# Patient Record
Sex: Male | Born: 1940
Health system: Southern US, Community
[De-identification: ages and names within clinical notes are randomized; demographics above are authoritative.]

## PROBLEM LIST (undated history)

## (undated) DIAGNOSIS — L4059 Other psoriatic arthropathy: Secondary | ICD-10-CM

## (undated) DIAGNOSIS — C61 Malignant neoplasm of prostate: Secondary | ICD-10-CM

---

## 1998-07-22 ENCOUNTER — Ambulatory Visit (HOSPITAL_COMMUNITY): Admission: RE | Admit: 1998-07-22 | Discharge: 1998-07-22 | Payer: Self-pay | Admitting: Urology

## 2007-06-06 HISTORY — PX: OTHER SURGICAL HISTORY: SHX169

## 2007-12-13 ENCOUNTER — Ambulatory Visit: Admission: RE | Admit: 2007-12-13 | Discharge: 2008-03-12 | Payer: Self-pay | Admitting: Radiation Oncology

## 2008-02-03 ENCOUNTER — Encounter: Admission: RE | Admit: 2008-02-03 | Discharge: 2008-02-03 | Payer: Self-pay | Admitting: Urology

## 2008-03-02 ENCOUNTER — Ambulatory Visit (HOSPITAL_BASED_OUTPATIENT_CLINIC_OR_DEPARTMENT_OTHER): Admission: RE | Admit: 2008-03-02 | Discharge: 2008-03-02 | Payer: Self-pay | Admitting: Urology

## 2008-03-12 ENCOUNTER — Ambulatory Visit: Admission: RE | Admit: 2008-03-12 | Discharge: 2008-05-18 | Payer: Self-pay | Admitting: Radiation Oncology

## 2010-10-18 NOTE — Op Note (Signed)
Lucas Holloway, Lucas Holloway                  ACCOUNT NO.:  [id number]   MEDICAL RECORD NO.:  [id number]          PATIENT TYPE:  AMB   LOCATION:  [facility]                         FACILITY:  [facility]   PHYSICIAN:  Excell Seltzer. Annabell Howells, M.D.    DATE OF BIRTH:  Feb 16, 1941   DATE OF PROCEDURE:  03/02/2008  DATE OF DISCHARGE:                               OPERATIVE REPORT   PROCEDURE:  1. Right hydrocelectomy.  2. Prostate seed implantation.   PREOPERATIVE DIAGNOSIS:  Right hydrocele and T2 A Gleason 6  adenocarcinoma the prostate.   POSTOPERATIVE DIAGNOSIS:  Right hydrocele and T2 A Gleason 6  adenocarcinoma the prostate.   SURGEON:  Dr. Bjorn Pippin.   RADIATION ONCOLOGIST:  Dr. Margaretmary Bayley.   ANESTHESIA:  General.   DRAIN:  Foley catheter and Penrose wound drain.   COMPLICATIONS:  None.   INDICATIONS:  Mr. Pineda is a 70 year old white male with a symptomatic  right hydrocele and a T2A NX MX adenocarcinoma of the prostate who is to  undergo right hydrocelectomy and radioactive seed implantation.   FINDINGS AND PROCEDURE:  The patient is given Cipro.  He was taken to  the operating room where general anesthetic was induced.  He was fitted  with PAS hose and was initially placed in the supine position.  His  scrotum and perineum were clipped.  He was prepped with Betadine  solution and draped in the usual sterile fashion.  An oblique right  anterior scrotal incision was made and the testicle was delivered from  the scrotum within the hydrocele sac.  The sac was opened and was  imbricated behind the testicle using a running 3-0 chromic suture.  Once  the hydrocele had been imbricated, hemostasis was achieved and the  testicle was returned to the right hemi scrotum.  A 1/4 inch Penrose  drain was placed through separate stab wound in the dependent portion of  the scrotum.  The wound was closed using a running 3-0 chromic on the  dartos and a running vertical mattress 3-0 chromic on the skin.  Great  care was taken to avoid entrapping the drain.  A dressing of 4x4s was  applied and a 16-French Foley catheter was inserted.  The balloon was  filled with dilute contrast.   The patient was then repositioned in the lithotomy position.  The  scrotum was then reflected from the perineum superiorly with an OpSite.  A red rubber rectal catheter was placed.  The ultrasound probe was  assembled and inserted and Dr. Kathrynn Running performed the prostate contouring  in preparation for the implant.   Once the dosimetry been completed, I returned and placed the needles per  the intraoperative plan, a total of 22 needles with 72 seeds of I-125  select seed U-1 was used to deliver a reference dose of 145 gray.   After completion of the implant the scrotal drape was removed.  The  Foley catheter was removed and cystoscopy was performed.  This  examination demonstrated a normal urethra and intact external sphincter.  The prostatic urethra had trilobar hyperplasia with mild  obstruction.  There was some bleeding in the area of the prostatic urethra with a  small amount of adherent clot but not much active bleeding.  There was a  small stringy clot in the bladder but no residual seeds.  No bladder  tumors were noted.  Ureteral orifices were unremarkable.  There was mild  trabeculation.   A fresh 16-French Foley catheter was inserted.  The balloon was filled  10 mL sterile fluid.  The bladder was irrigated with a Toomey syringe  with return of the previously-mentioned clots.  The catheter was then  placed to straight drainage.  He was taken down from lithotomy position  after a dressing was applied to the perineum and his anesthetic was  reversed.  He was removed to the recovery room in stable condition.  There were no complications.      Excell Seltzer. Annabell Howells, M.D.  Electronically Signed     JJW/MEDQ  D:  03/02/2008  T:  03/02/2008  Job:  [id number]   cc:   Tammy R. Collins [place], M.D.  Fax: [phone]   Artist Pais.  Kathrynn Running, M.D.  Fax: [phone]

## 2012-02-14 ENCOUNTER — Encounter (HOSPITAL_COMMUNITY): Payer: Self-pay | Admitting: *Deleted

## 2012-02-14 ENCOUNTER — Emergency Department (HOSPITAL_COMMUNITY)
Admission: EM | Admit: 2012-02-14 | Discharge: 2012-02-14 | Disposition: A | Discharge status: Home / Self Care | Payer: Medicare Other | Attending: Emergency Medicine | Admitting: Emergency Medicine

## 2012-02-14 DIAGNOSIS — Z8546 Personal history of malignant neoplasm of prostate: Secondary | ICD-10-CM | POA: Insufficient documentation

## 2012-02-14 DIAGNOSIS — R339 Retention of urine, unspecified: Secondary | ICD-10-CM

## 2012-02-14 HISTORY — DX: Malignant neoplasm of prostate: C61

## 2012-02-14 LAB — URINALYSIS, MICROSCOPIC ONLY
Bilirubin Urine: NEGATIVE
Glucose, UA: NEGATIVE mg/dL
Ketones, ur: NEGATIVE mg/dL
pH: 6.5 (ref 5.0–8.0)

## 2012-02-14 NOTE — ED Provider Notes (Signed)
History     CSN: [id number]  Arrival date & time 02/14/12  1844   First MD Initiated Contact with Patient 02/14/12 2126      Chief Complaint  Patient presents with  . Urinary Retention   HPI  History provided by the patient. Patient is a 71 year old male with history of prostate cancer and seeding performed in 2009 who presents with complaints of urinary retention and lower pelvic and abdominal pain and pressure. Patient denies having similar symptoms previously. Patient states that he was feeling well today around 1 PM after eating lunch began to have reduced urinary output with dribbling. Patient felt the urge to use bathroom again at 2 PM and again had small amounts of urine. Patient states 3 PM he is not getting any urine out but feeling some urge to urinate. Symptoms gradually increased throughout the evening until he began to have severe lower abdominal pains and pressures tonight. Patient denies having similar symptoms previously. Prior to symptoms today he denied any dysuria, hematuria, urinary frequency or flank pains. Patient denies any fever, chills or nausea vomiting.    Past Medical History  Diagnosis Date  . Prostate cancer   . Arthritis     History reviewed. No pertinent past surgical history.  No family history on file.  History  Substance Use Topics  . Smoking status: Never Smoker   . Smokeless tobacco: Not on file  . Alcohol Use: No      Review of Systems  Constitutional: Negative for fever and chills.  Gastrointestinal: Positive for abdominal pain. Negative for nausea, vomiting, diarrhea and constipation.  Genitourinary: Positive for hematuria, decreased urine volume and difficulty urinating. Negative for dysuria, frequency, flank pain and discharge.    Allergies  Review of patient's allergies indicates no known allergies.  Home Medications   Current Outpatient Rx  Name Route Sig Dispense Refill  . CALCIUM CARBONATE-VITAMIN D 500-200 MG-UNIT PO  TABS Oral Take 1 tablet by mouth 2 (two) times daily.    . OMEGA-3 FATTY ACIDS 1000 MG PO CAPS Oral Take 1 g by mouth 2 (two) times daily.    Marland Kitchen FOLIC ACID 1 MG PO TABS Oral Take 1 mg by mouth daily.    . MELOXICAM 7.5 MG PO TABS Oral Take 7.5 mg by mouth daily.    Marland Kitchen METHOTREXATE 2.5 MG PO TABS Oral Take 15 mg by mouth once a week. Takes on sundays.Marland KitchenMarland KitchenCaution:Chemotherapy. Protect from light.    . ADULT MULTIVITAMIN W/MINERALS CH Oral Take 1 tablet by mouth daily.    Marland Kitchen NIACIN 500 MG PO TABS Oral Take 500 mg by mouth daily with breakfast.    . SIMVASTATIN 40 MG PO TABS Oral Take 40 mg by mouth every evening.    Marland Kitchen VITAMIN E 400 UNITS PO CAPS Oral Take 400 Units by mouth daily.      BP 181/77  Pulse 82  Temp 97.9 F (36.6 C) (Oral)  Resp 20  Ht 5\' 8"  (1.727 m)  Wt 180 lb (81.647 kg)  BMI 27.37 kg/m2  SpO2 99%  Physical Exam  Nursing note and vitals reviewed. Constitutional: He appears well-developed and well-nourished. No distress.  HENT:  Head: Normocephalic.  Cardiovascular: Normal rate and regular rhythm.   Pulmonary/Chest: Effort normal and breath sounds normal. No respiratory distress. He has no wheezes. He has no rales.  Abdominal: Soft. He exhibits no distension. There is no tenderness. There is no rebound and no guarding.  Genitourinary: Penis normal.  Foley catheter in place. Small amount of blood around the urethra and meatus. No active bleeding. There is some blood within the Foley catheter system.  Neurological: He is alert.  Skin: Skin is warm.  Psychiatric: He has a normal mood and affect. His behavior is normal.    ED Course  Procedures   Results for orders placed during the hospital encounter of 02/14/12  URINALYSIS, WITH MICROSCOPIC      Component Value Range   Color, Urine YELLOW  YELLOW   APPearance CLEAR  CLEAR   Specific Gravity, Urine 1.016  1.005 - 1.030   pH 6.5  5.0 - 8.0   Glucose, UA NEGATIVE  NEGATIVE mg/dL   Hgb urine dipstick LARGE (*)  NEGATIVE   Bilirubin Urine NEGATIVE  NEGATIVE   Ketones, ur NEGATIVE  NEGATIVE mg/dL   Protein, ur NEGATIVE  NEGATIVE mg/dL   Urobilinogen, UA 0.2  0.0 - 1.0 mg/dL   Nitrite NEGATIVE  NEGATIVE   Leukocytes, UA NEGATIVE  NEGATIVE   RBC / HPF 21-50  <3 RBC/hpf   Bacteria, UA RARE  RARE       1. Urinary retention       MDM  9:25pm patient seen and evaluated. Patient reports feeling significantly improved after having a Foley catheter inserted. Patient denies history of similar symptoms in the past. Did have prostate seeding for cancer performed at 2009 by Dr. Annabell Howells.        Angus Seller, [place] 02/15/12 [phone]

## 2012-02-14 NOTE — ED Notes (Addendum)
Pt reports urinary retention that began approx 1300 today, pt has only had dribbles or urine since 1300. Pt w/ hx prostate CA, treated in 2008. Pt in acute distress d/t lower abd pain/pressure. Pt was seen at [facility] for same and was told he needed a catheter however they did not have the supplies at the facility and pt was then referred to ED.

## 2012-02-14 NOTE — ED Notes (Signed)
Pt reports also received flu vaccine on Monday, developed cough and yesterday and trouble urinating today.

## 2012-02-16 LAB — URINE CULTURE
Colony Count: NO GROWTH
Culture: NO GROWTH

## 2012-02-16 NOTE — ED Provider Notes (Signed)
Medical screening examination/treatment/procedure(s) were performed by non-physician practitioner and as supervising physician I was immediately available for consultation/collaboration.  Derwood Kaplan, MD 02/16/12 0045

## 2015-02-18 DIAGNOSIS — Z79899 Other long term (current) drug therapy: Secondary | ICD-10-CM | POA: Diagnosis not present

## 2015-02-18 DIAGNOSIS — L4050 Arthropathic psoriasis, unspecified: Secondary | ICD-10-CM | POA: Diagnosis not present

## 2015-02-18 DIAGNOSIS — L401 Generalized pustular psoriasis: Secondary | ICD-10-CM | POA: Diagnosis not present

## 2015-03-29 ENCOUNTER — Other Ambulatory Visit: Payer: Self-pay | Admitting: Urology

## 2015-03-29 DIAGNOSIS — C61 Malignant neoplasm of prostate: Secondary | ICD-10-CM

## 2015-04-13 ENCOUNTER — Encounter (HOSPITAL_COMMUNITY)
Admission: RE | Admit: 2015-04-13 | Discharge: 2015-04-13 | Disposition: A | Discharge status: Home / Self Care | Payer: Medicare Other | Source: Ambulatory Visit | Attending: Urology | Admitting: Urology

## 2015-04-13 DIAGNOSIS — C61 Malignant neoplasm of prostate: Secondary | ICD-10-CM | POA: Insufficient documentation

## 2015-04-13 MED ORDER — TECHNETIUM TC 99M MEDRONATE IV KIT: 25.0000 | PACK | Freq: Once | INTRAVENOUS | Status: DC | PRN

## 2015-04-13 MED ORDER — TECHNETIUM TC 99M MEDRONATE IV KIT
26.9000 | PACK | Freq: Once | INTRAVENOUS | Status: AC | PRN
  Administered 2015-04-13: 26.9 via INTRAVENOUS

## 2015-08-16 ENCOUNTER — Other Ambulatory Visit (INDEPENDENT_AMBULATORY_CARE_PROVIDER_SITE_OTHER): Payer: Self-pay | Admitting: Otolaryngology

## 2015-08-16 DIAGNOSIS — H903 Sensorineural hearing loss, bilateral: Secondary | ICD-10-CM | POA: Diagnosis not present

## 2015-08-16 DIAGNOSIS — H9190 Unspecified hearing loss, unspecified ear: Secondary | ICD-10-CM

## 2015-08-16 DIAGNOSIS — H6983 Other specified disorders of Eustachian tube, bilateral: Secondary | ICD-10-CM | POA: Diagnosis not present

## 2015-08-30 ENCOUNTER — Ambulatory Visit
Admission: RE | Admit: 2015-08-30 | Discharge: 2015-08-30 | Disposition: A | Discharge status: Home / Self Care | Payer: Medicare Other | Source: Ambulatory Visit | Attending: Otolaryngology | Admitting: Otolaryngology

## 2015-08-30 DIAGNOSIS — H9191 Unspecified hearing loss, right ear: Secondary | ICD-10-CM | POA: Diagnosis not present

## 2015-08-30 DIAGNOSIS — H9190 Unspecified hearing loss, unspecified ear: Secondary | ICD-10-CM

## 2015-08-30 MED ORDER — GADOBENATE DIMEGLUMINE 529 MG/ML IV SOLN
17.0000 mL | Freq: Once | INTRAVENOUS | Status: AC | PRN
  Administered 2015-08-30: 17 mL via INTRAVENOUS

## 2015-09-01 DIAGNOSIS — C61 Malignant neoplasm of prostate: Secondary | ICD-10-CM | POA: Diagnosis not present

## 2015-09-01 DIAGNOSIS — C7951 Secondary malignant neoplasm of bone: Secondary | ICD-10-CM | POA: Diagnosis not present

## 2015-09-08 DIAGNOSIS — D352 Benign neoplasm of pituitary gland: Secondary | ICD-10-CM | POA: Diagnosis not present

## 2015-09-08 DIAGNOSIS — Z6825 Body mass index (BMI) 25.0-25.9, adult: Secondary | ICD-10-CM | POA: Diagnosis not present

## 2015-09-10 DIAGNOSIS — D352 Benign neoplasm of pituitary gland: Secondary | ICD-10-CM | POA: Diagnosis not present

## 2015-09-10 DIAGNOSIS — Z0100 Encounter for examination of eyes and vision without abnormal findings: Secondary | ICD-10-CM | POA: Diagnosis not present

## 2015-09-27 DIAGNOSIS — E039 Hypothyroidism, unspecified: Secondary | ICD-10-CM | POA: Diagnosis not present

## 2015-09-27 DIAGNOSIS — C7951 Secondary malignant neoplasm of bone: Secondary | ICD-10-CM | POA: Diagnosis not present

## 2015-09-27 DIAGNOSIS — C61 Malignant neoplasm of prostate: Secondary | ICD-10-CM | POA: Diagnosis not present

## 2015-09-29 DIAGNOSIS — E039 Hypothyroidism, unspecified: Secondary | ICD-10-CM | POA: Diagnosis not present

## 2015-09-29 DIAGNOSIS — E782 Mixed hyperlipidemia: Secondary | ICD-10-CM | POA: Diagnosis not present

## 2015-09-29 DIAGNOSIS — J309 Allergic rhinitis, unspecified: Secondary | ICD-10-CM | POA: Diagnosis not present

## 2015-09-29 DIAGNOSIS — K219 Gastro-esophageal reflux disease without esophagitis: Secondary | ICD-10-CM | POA: Diagnosis not present

## 2015-10-04 DIAGNOSIS — R351 Nocturia: Secondary | ICD-10-CM | POA: Diagnosis not present

## 2015-10-04 DIAGNOSIS — C61 Malignant neoplasm of prostate: Secondary | ICD-10-CM | POA: Diagnosis not present

## 2015-10-04 DIAGNOSIS — C7951 Secondary malignant neoplasm of bone: Secondary | ICD-10-CM | POA: Diagnosis not present

## 2015-10-04 DIAGNOSIS — Z0000 Encounter for general adult medical examination without abnormal findings: Secondary | ICD-10-CM | POA: Diagnosis not present

## 2015-10-25 DIAGNOSIS — L4050 Arthropathic psoriasis, unspecified: Secondary | ICD-10-CM | POA: Diagnosis not present

## 2015-10-28 ENCOUNTER — Ambulatory Visit (INDEPENDENT_AMBULATORY_CARE_PROVIDER_SITE_OTHER): Payer: Medicare Other | Admitting: Internal Medicine

## 2015-10-28 ENCOUNTER — Encounter: Payer: Self-pay | Admitting: Internal Medicine

## 2015-10-28 VITALS — BP 112/62 | HR 79 | Temp 97.5°F | Resp 12 | Wt 164.0 lb

## 2015-10-28 DIAGNOSIS — D352 Benign neoplasm of pituitary gland: Secondary | ICD-10-CM

## 2015-10-28 NOTE — Patient Instructions (Addendum)
Please come back at 8 am, fasting, for labs. Do not take Levothyroxine (thyroid hormone) before labs, but take it immediately after.  Please continue Levothyroxine 75 mcg daily.  Take the thyroid hormone every day, with water, at least 30 minutes before breakfast, separated by at least 4 hours from: - acid reflux medications - calcium - iron - multivitamins  Please come back in 1 year.

## 2015-10-28 NOTE — Progress Notes (Signed)
Patient ID: Lucas Holloway, male   DOB: 1940-06-26, 75 y.o.   MRN: [id number]   HPI  Lucas Holloway is a 75 y.o.-year-old male, referred by his neurosurgeon, Dr. Cyndy Freeze for evaluation for pituitary macroadenoma. He is here with his wife who offers additional history.  Pt. has been dx with a pituitary adenoma in 75/27/2017. This was found during investigation for worsening hearing loss, dx 2015.  Report: Pituitary mass likely reflecting a pituitary macroadenoma measuring 15 x 15 x 16 mm:     Of note, he also has metastatic prostate cancer dx in 2009. He had Rx seeds implanted. He is on Firmagon (degarelix) - Tx'ed by Dr. [name].  Pt denies: - headaches - weight gain - fatigue - constipation - dry skin - hair loss - depression - no increased thirst, but frequent urination ever after his prostate surgery  No pituitary test available for review (except testosterone levels per review of urology note):   Latest Na level normal: 09/27/2015: 135 (135-146)  He had a Visual Field test checked - 09/2015 >> reportedly normal.  He has primary hypothyroidism dx 2016 >> on LT4 75 mcg daily: - at 3-4 am - with water - eats >30 min later- takes calcium and MVI at b'fast and later   ROS: Constitutional: no weight gain/loss, no fatigue, no subjective hyperthermia/hypothermia, + nocturia Eyes: no blurry vision, no xerophthalmia ENT: no sore throat, no nodules palpated in throat, no dysphagia/odynophagia, no hoarseness Cardiovascular: no CP/SOB/palpitations/leg swelling Respiratory: no cough/SOB Gastrointestinal: no N/V/D/C, + acid reflux Musculoskeletal: no muscle/joint aches Skin: no rashes Neurological: no tremors/numbness/tingling/dizziness Psychiatric: no depression/anxiety  Past Medical History  Diagnosis Date  . Prostate cancer ([place])   . Arthritis    No past surgical history on file. Social History   Social History  . Marital Status: Married    Spouse Name: N/A  . Number of  Children: yes   Occupational History  . retired   Social History Main Topics  . Smoking status: Former Research scientist (life sciences)  . Smokeless tobacco: Not on file  . Alcohol Use: No  . Drug Use: No   Current Outpatient Prescriptions on File Prior to Visit  Medication Sig Dispense Refill  . calcium-vitamin D (OSCAL WITH D) 500-200 MG-UNIT per tablet Take 1 tablet by mouth 2 (two) times daily.    . fish oil-omega-3 fatty acids 1000 MG capsule Take 1 g by mouth 2 (two) times daily.    . folic acid (FOLVITE) 1 MG tablet Take 1 mg by mouth daily.    . meloxicam (MOBIC) 7.5 MG tablet Take 7.5 mg by mouth daily.    . methotrexate (RHEUMATREX) 2.5 MG tablet Take 15 mg by mouth once a week. Takes on sundays.Marland KitchenMarland KitchenCaution:Chemotherapy. Protect from light.    . Multiple Vitamin (MULTIVITAMIN WITH MINERALS) TABS Take 1 tablet by mouth daily.    . simvastatin (ZOCOR) 40 MG tablet Take 40 mg by mouth every evening.    . vitamin E 400 UNIT capsule Take 400 Units by mouth daily.     No current facility-administered medications on file prior to visit.   No Known Allergies   No family history on file.  PE: BP 112/62 mmHg  Pulse 79  Temp(Src) 97.5 F (36.4 C) (Oral)  Resp 12  Wt 164 lb (74.39 kg)  SpO2 95% Body mass index is 24.94 kg/(m^2). Wt Readings from Last 3 Encounters:  10/28/15 164 lb (74.39 kg)  02/14/12 180 lb (81.647 kg)   Constitutional: Normal weight, in NAD  Eyes: PERRLA, EOMI, no exophthalmos ENT: moist mucous membranes, no thyromegaly, no cervical lymphadenopathy Cardiovascular: RRR, No MRG Respiratory: CTA B Gastrointestinal: abdomen soft, NT, ND, BS+ Musculoskeletal: no deformities, strength intact in all 4 Skin: moist, warm, no rashes Neurological: no tremor with outstretched hands, DTR normal in all 4, difficult to evaluate peripheral visual field due to wearing glasses (he had left and right mid and upper impairment, however, it is not clear whether these are from a smaller  glasses)  ASSESSMENT: 1. Pituitary macroadenoma  2. Primary hypothyroidism  PLAN:  1. Patient with a newly diagnosed pituitary macroadenoma, incidentally found during investigation for worsening hypoacusis.  - I reviewed the pituitary MRI images and report along with the patient and his wife. Since the tumor is >1 cm, this qualifies as a macro-adenoma.  - I explained that this is not a "brain tumor". These tumors are extremely rarely malignant, the vast majority of them are benign. I discussed with him that there is a small possibility of pituitary metastasis from his metastatic prostate cancer. I would think about a possible biopsy only if the mass enlarges or if we see hypopituitarism. - We discussed about the fact that the pituitary adenoma can be:  Not producing hormones, not compressing the pituitary gland or the optic chiasm  Not producing hormones, but compressing either the pituitary gland (causing hypopituitarism) or the optic chiasm (causing visual field cuts)  Not producing hormones, not compressing the pituitary gland or the optic chiasm, but growing in size >> an indication that he may develop compression later on  Producing hormones: - Prolactin (prolactinoma) - he has no breast milk discharge, no HAs.  - ACTH (Cushing's disease) - he has no weight gain, no central distribution of fat, wide, purple, stretch marks, full supraclavicular fat pads, diabetes, hypertension. - growth hormone (acromegaly) - he denies an enlarged jaw, change in the size of rings or changing shoe sizes - LH or FSH (gonadotropin secreting tumor) (rare) - TSH (TSH secreting tumor) (very rare) - He is having increased urination at night, but this started after his prostate surgery and is not associated with increased thirst. His sodium levels have been normal, so I doubt DI - as the first step in the investigation we will need to check the above hormones. There are 2 problems: 1. He has primary  hyperthyroidism and is on levothyroxine, so we will need to be careful with interpretation of his TFTs 2. He is using an GnRH inhibitor to reduce testosterone levels, and per review of records from Dr. [name], his urologist, last testosterone level was 30(castrate level), therefore, testosterone level, LH, [place] are uninterpretable - I will not order. - will try to obtain the visual field, since his tumor is close to the optic chiasm (however, not contacting or impinging on it per review of the MRI images). He had the visual field performed approximately 1.5 mo ago and he signed a release of information form so we can obtain the records from Dr. Valetta Close. - I ordered the following labs - patient will return for these fasting, at 8 AM: Orders Placed This Encounter  Procedures  . Cortisol  . ACTH  . TSH  . Prolactin  . Insulin-like growth factor  . T3, free  . T4, free  - we discussed about the following scenarios:  if the above labs are normal, and the visual field is also normal, we can repeat a pituitary MRI in a year to check for growth. If there is  no growth, we can manage the pituitary tumor expectantly, without surgical intervention.  If the above labs are abnormal, depending on the hormone that it is producing, we may treat this medically (cabergoline/bromocriptine for prolactinoma, somatostatin for TSH-oma) or surgically for the other tumors.  if the above labs are normal at the visual field is abnormal, he will need surgery - we discussed about referring him to [facility] for neurosurgery if needed. I explained that there are 3 types of pituitary surgeries:  Transsphenoidal nasal (most commonly used)  Transsphenoidal sublabial - under upper lip  Open craniotomy (not applicable for her for such a small tumor) - we'll proceed with the above tests and will decide about further testing or treatment when the results are back - If the pituitary hormone levels are normal, also visual field is normal,  no intervention is needed for now, and we will need to wait for the results of the repeat MRI in 6 months. Therefore, I would've like to see the patient back in 7 months, however, they go to [place] for the winter, returning in March. In this case, we'll schedule an appointment in one year from now, but would be in contact and we may change this depending on the new information coming in.  2. Primary hypothyroidism - Patient with ~1 year of hypothyroidism, on levothyroxine therapy. He appears euthyroid. He does not appear to have a goiter, thyroid nodules, or neck compression symptoms - We discussed about correct intake of levothyroxine, fasting, with water, separated by at least 30 minutes from breakfast, and separated by more than 4 hours from calcium, iron, multivitamins, acid reflux medications (PPIs). He is taking it correctly. - will check thyroid tests at next lab draw: TSH, free T4 - If these are abnormal, he will need to return in 6-8 weeks for repeat labs  - time spent with the patient: 1 hour, of which >50% was spent in obtaining information about his symptoms, reviewing his previous labs, evaluations, and treatments, counseling him about his conditions (please see the discussed topics above), and developing a plan to further investigate and treat them; he and his wife had a number of questions which I addressed.  Component     Latest Ref Rng 10/29/2015  IGF-I, LC/MS     34 - 245 ng/mL 50  Z-Score (Male)     -2.0-+2.0 SD -1.3  Cortisol, Plasma      15.5  C206 ACTH     6 - 50 pg/mL 42  TSH     0.35 - 4.50 uIU/mL 1.05  Prolactin     2.0 - 18.0 ng/mL 15.0  Triiodothyronine,Free,Serum     2.3 - 4.2 pg/mL 4.4 (H)  T4,Free(Direct)     0.60 - 1.60 ng/dL 0.60   Labs are normal.  We also received a visual field report and confirmed that this was normal.  In this case, no intervention is needed for now, awaiting his next MRI.

## 2015-10-29 ENCOUNTER — Other Ambulatory Visit (INDEPENDENT_AMBULATORY_CARE_PROVIDER_SITE_OTHER): Payer: Medicare Other

## 2015-10-29 DIAGNOSIS — D352 Benign neoplasm of pituitary gland: Secondary | ICD-10-CM | POA: Diagnosis not present

## 2015-10-29 LAB — T4, FREE: Free T4: 0.6 ng/dL (ref 0.60–1.60)

## 2015-10-29 LAB — T3, FREE: T3 FREE: 4.4 pg/mL — AB (ref 2.3–4.2)

## 2015-10-29 LAB — CORTISOL: CORTISOL PLASMA: 15.5 ug/dL

## 2015-10-29 LAB — TSH: TSH: 1.05 u[IU]/mL (ref 0.35–4.50)

## 2015-10-30 LAB — PROLACTIN: PROLACTIN: 15 ng/mL (ref 2.0–18.0)

## 2015-11-02 LAB — ACTH: C206 ACTH: 42 pg/mL (ref 6–50)

## 2015-11-03 DIAGNOSIS — C61 Malignant neoplasm of prostate: Secondary | ICD-10-CM | POA: Diagnosis not present

## 2015-11-03 DIAGNOSIS — Z5111 Encounter for antineoplastic chemotherapy: Secondary | ICD-10-CM | POA: Diagnosis not present

## 2015-11-03 DIAGNOSIS — C7951 Secondary malignant neoplasm of bone: Secondary | ICD-10-CM | POA: Diagnosis not present

## 2015-11-04 DIAGNOSIS — D352 Benign neoplasm of pituitary gland: Secondary | ICD-10-CM | POA: Insufficient documentation

## 2015-11-04 LAB — INSULIN-LIKE GROWTH FACTOR
IGF-I, LC/MS: 50 ng/mL (ref 34–245)
Z-Score (Male): -1.3 SD (ref ?–2.0)

## 2015-11-10 DIAGNOSIS — L4059 Other psoriatic arthropathy: Secondary | ICD-10-CM | POA: Diagnosis not present

## 2015-12-02 DIAGNOSIS — C7951 Secondary malignant neoplasm of bone: Secondary | ICD-10-CM | POA: Diagnosis not present

## 2015-12-02 DIAGNOSIS — C61 Malignant neoplasm of prostate: Secondary | ICD-10-CM | POA: Diagnosis not present

## 2015-12-03 ENCOUNTER — Encounter: Payer: Self-pay | Admitting: Internal Medicine

## 2015-12-06 ENCOUNTER — Other Ambulatory Visit: Payer: Self-pay | Admitting: Urology

## 2015-12-06 DIAGNOSIS — C61 Malignant neoplasm of prostate: Secondary | ICD-10-CM | POA: Diagnosis not present

## 2015-12-13 ENCOUNTER — Encounter (HOSPITAL_COMMUNITY)
Admission: RE | Admit: 2015-12-13 | Discharge: 2015-12-13 | Disposition: A | Discharge status: Home / Self Care | Payer: Medicare Other | Source: Ambulatory Visit | Attending: Urology | Admitting: Urology

## 2015-12-13 DIAGNOSIS — Z8546 Personal history of malignant neoplasm of prostate: Secondary | ICD-10-CM | POA: Diagnosis not present

## 2015-12-13 DIAGNOSIS — C61 Malignant neoplasm of prostate: Secondary | ICD-10-CM | POA: Diagnosis not present

## 2015-12-13 DIAGNOSIS — C7951 Secondary malignant neoplasm of bone: Secondary | ICD-10-CM | POA: Diagnosis not present

## 2015-12-13 MED ORDER — TECHNETIUM TC 99M MEDRONATE IV KIT
27.4000 | PACK | Freq: Once | INTRAVENOUS | Status: AC | PRN
  Administered 2015-12-13: 27.4 via INTRAVENOUS

## 2015-12-14 ENCOUNTER — Ambulatory Visit: Payer: Medicare Other | Admitting: Oncology

## 2015-12-16 ENCOUNTER — Telehealth: Payer: Self-pay | Admitting: Oncology

## 2015-12-16 ENCOUNTER — Encounter: Payer: Self-pay | Admitting: Oncology

## 2015-12-16 ENCOUNTER — Ambulatory Visit (HOSPITAL_BASED_OUTPATIENT_CLINIC_OR_DEPARTMENT_OTHER): Payer: Medicare Other | Admitting: Oncology

## 2015-12-16 VITALS — BP 144/70 | HR 76 | Temp 98.2°F | Resp 18 | Ht 68.0 in | Wt 165.0 lb

## 2015-12-16 DIAGNOSIS — Z87891 Personal history of nicotine dependence: Secondary | ICD-10-CM | POA: Diagnosis not present

## 2015-12-16 DIAGNOSIS — E291 Testicular hypofunction: Secondary | ICD-10-CM | POA: Diagnosis not present

## 2015-12-16 DIAGNOSIS — C7951 Secondary malignant neoplasm of bone: Secondary | ICD-10-CM

## 2015-12-16 DIAGNOSIS — C61 Malignant neoplasm of prostate: Secondary | ICD-10-CM

## 2015-12-16 MED ORDER — PREDNISONE 5 MG PO TABS: 5.0000 mg | ORAL_TABLET | Freq: Every day | ORAL | Status: DC

## 2015-12-16 MED ORDER — ABIRATERONE ACETATE 250 MG PO TABS: 1000.0000 mg | ORAL_TABLET | Freq: Every day | ORAL | Status: DC

## 2015-12-16 NOTE — Progress Notes (Signed)
I sent prior auth req for zytiga -covermymeds- I will let [facility] outpat know when done.

## 2015-12-16 NOTE — Telephone Encounter (Signed)
Gave patient avs report and appointments for August.  °

## 2015-12-16 NOTE — Consult Note (Signed)
Reason for Referral: Prostate cancer.  HPI: 75 year old gentleman currently of [place] where he lived for the last 20 years or so. He is a gentleman with a history of hypothyroidism and rheumatoid arthritis but for the most part and reasonably good health and shape. He was diagnosed with prostate cancer in 2009. His PSA around 9 and a Gleason score of 6. He underwent seed implant break in therapy and had an excellent PSA response initially. His PSA nadir was 0.7 and 2013. His PSA did go up to 1.2 in October 2016 however. His staging workup at that time including a bone scan which showed metastatic bony disease. He was started on androgen deprivation under the care of Dr. [name] and his PSA did drop down initially to 0.61 in May 2017. On 12/02/2015 his PSA was 1.44 with a testosterone level of 32. A repeat a bone scan obtained on 12/13/2015 showed progression of disease with increased uptake in the anterior and posterior ribs, sternum and right humerus. Pelvic and left femoral diaphysis was also noted. Patient has been receiving Delton See and Mills Koller for treatment. Clinically, he is asymptomatic at this point. He does not report any bone pain or back pain or pathological fractures. He did not report any decline in his energy her performance status. He continues to perform activities of daily living and other hobbies including golf without any decline. He does not report any major urinary symptoms but does report erectile dysfunction. His performance status remains excellent.  He does not report any headaches, blurry vision, syncope or seizures. He does not report any fevers or chills or sweats. He does not report any cough, wheezing or hemoptysis. He does not report any nausea, vomiting or abdominal pain. He does not report any frequency urgency or hesitancy. He does not report any skeletal complaints of arthralgias myalgias. He does not report any lymphadenopathy or petechiae. Remaining review of systems  unremarkable.   Past Medical History  Diagnosis Date  . Prostate cancer ([place])   . Arthritis   :  No past surgical history on file.:   Current outpatient prescriptions:  .  calcium-vitamin D (OSCAL WITH D) 500-200 MG-UNIT per tablet, Take 1 tablet by mouth 2 (two) times daily., Disp: , Rfl:  .  Degarelix Acetate (FIRMAGON [place]), One injection a month, Disp: , Rfl:  .  Denosumab (XGEVA [place]), One injection a month, Disp: , Rfl:  .  fish oil-omega-3 fatty acids 1000 MG capsule, Take 1 g by mouth 2 (two) times daily., Disp: , Rfl:  .  folic acid (FOLVITE) 1 MG tablet, Take 1 mg by mouth daily., Disp: , Rfl:  .  levothyroxine (SYNTHROID, LEVOTHROID) 75 MCG tablet, Take 75 mcg by mouth daily before breakfast. , Disp: , Rfl: 3 .  meloxicam (MOBIC) 7.5 MG tablet, Take 7.5 mg by mouth daily., Disp: , Rfl:  .  methotrexate (RHEUMATREX) 2.5 MG tablet, Take 15 mg by mouth once a week. Takes on sundays.Marland KitchenMarland KitchenCaution:Chemotherapy. Protect from light., Disp: , Rfl:  .  Multiple Vitamin (MULTIVITAMIN WITH MINERALS) TABS, Take 1 tablet by mouth daily., Disp: , Rfl:  .  simvastatin (ZOCOR) 20 MG tablet, Take 1 tablet by mouth daily at 2 PM., Disp: , Rfl: 3 .  vitamin E 400 UNIT capsule, Take 400 Units by mouth daily., Disp: , Rfl:  .  abiraterone Acetate (ZYTIGA) 250 MG tablet, Take 4 tablets (1,000 mg total) by mouth daily. Take on an empty stomach 1 hour before or 2 hours after a  meal, Disp: 120 tablet, Rfl: 0 .  predniSONE (DELTASONE) 5 MG tablet, Take 1 tablet (5 mg total) by mouth daily with breakfast., Disp: 30 tablet, Rfl: 3:  No Known Allergies:  No family history on file.:  Social History   Social History  . Marital Status: Married    Spouse Name: N/A  . Number of Children: N/A  . Years of Education: N/A   Occupational History  . Not on file.   Social History Main Topics  . Smoking status: Former Research scientist (life sciences)  . Smokeless tobacco: Not on file  . Alcohol Use: No  . Drug Use: No  . Sexual  Activity: Not on file   Other Topics Concern  . Not on file   Social History Narrative  :  Pertinent items are noted in HPI.  Exam: Blood pressure 144/70, pulse 76, temperature 98.2 F (36.8 C), temperature source Oral, resp. rate 18, height 5\' 8"  (1.727 m), weight 165 lb (74.844 kg), SpO2 99 %. General appearance: alert and cooperative appeared without distress. Head: Normocephalic, without obvious abnormality Throat: lips, mucosa, and tongue normal; teeth and gums normal no oral thrush noted. Neck: no adenopathy Back: negative Resp: clear to auscultation bilaterally no rhonchi, wheezes or dullness to percussion. Chest wall: no tenderness Cardio: regular rate and rhythm, S1, S2 normal, no murmur, click, rub or gallop GI: soft, non-tender; bowel sounds normal; no masses,  no organomegaly Extremities: extremities normal, atraumatic, no cyanosis or edema Pulses: 2+ and symmetric Skin: Skin color, texture, turgor normal. No rashes or lesions Lymph nodes: Cervical, supraclavicular, and axillary nodes normal. Neurologic: Grossly normal  His electrolytes and liver function test obtained on 12/02/2015 with Dr. [name] were personally reviewed. His creatinine was 0.7. His total bilirubin is 0.5 with normal AST, ALT and alkaline phosphatase. His creatinine clearance was 117.4. His potassium slightly high at 5.5 but otherwise normal.   Nm Bone Scan Whole Body  12/13/2015  CLINICAL DATA:  Prostate cancer, increased PSA EXAM: NUCLEAR MEDICINE WHOLE BODY BONE SCAN TECHNIQUE: Whole body anterior and posterior images were obtained approximately 3 hours after intravenous injection of radiopharmaceutical. RADIOPHARMACEUTICALS:  27.4 mCi Technetium-9m MDP IV COMPARISON:  04/13/2015 Radiographic correlation:  CT abdomen and pelvis 12/13/2014 FINDINGS: Multiple abnormal foci of increased osseous tracer accumulation identified compatible with osseous metastatic disease. Foci of abnormal uptake are seen  within the anterior and posterior ribs bilaterally, sternum, proximal RIGHT humerus, pelvis, and at 2 foci in the LEFT femoral diaphysis. These have increased in number since the previous exam. Expected urinary tract and soft tissue distribution of tracer. IMPRESSION: Progressive osseous metastatic disease as above, including to foci of abnormal increased tracer localization now identified within the proximal and mid LEFT femoral diaphysis. Electronically Signed   By: Lavonia Dana M.D.   On: 12/13/2015 14:50    Assessment and Plan:   75 year old gentleman with the following issues:  1. Prostate cancer initially diagnosed in 2009 without Gleason score 6, stage TIc and a PSA of around 9. He was treated with brachytherapy utilizing seed implants and developed recurrent disease in October 2016. He developed a bony metastasis and treated with androgen deprivation.  Most recently his PSA increased up to 1.44 with a doubling time of less than 2 months with castrate level testosterone. His bone scan showed progression of disease. He is completely asymptomatic at this point with excellent quality of life.  The natural course of castration resistant metastatic prostate cancer with disease to the bone was reviewed today. Treatment  options were also discussed. He understands the treatment option at this time or palliative and not curative in nature. These include Pro[place]ge immunotherapy, second line hormonal therapy such as Xtandi or [place], [place] and systemic chemotherapy. These options were discussed today and upon discussion he would be a good candidate for second line hormone therapy.  The rationale for using Zytiga and prednisone were discussed today and the benefit include improvement in progression free survival and overall survival. He does have rather bulky bone disease and Zytiga is certainly useful in this particular setting. Complications associated with this medication including hypertension, lower from  any edema and electrolyte imbalance. Complications with prednisone including hyperglycemia, edema and weight gain.  After discussion today he is agreeable to proceed and he was started on 1000 mg of Zytiga with 5 mg of prednisone. We'll recheck his PSA in one month.  2. Androgen depravation: He will continue on that indefinitely. He is currently receiving Firmagon under the care of Dr. [name].  3. Bone directed therapy: I recommended continuing Xgeva for the time being.  4. Follow-up: Will be in 4 weeks to assess any complications associated with this therapy.

## 2015-12-16 NOTE — Progress Notes (Signed)
Please see consult note.  

## 2015-12-17 DIAGNOSIS — C61 Malignant neoplasm of prostate: Secondary | ICD-10-CM | POA: Diagnosis not present

## 2015-12-17 DIAGNOSIS — Z192 Hormone resistant malignancy status: Secondary | ICD-10-CM | POA: Diagnosis not present

## 2015-12-17 DIAGNOSIS — C7951 Secondary malignant neoplasm of bone: Secondary | ICD-10-CM | POA: Diagnosis not present

## 2015-12-20 ENCOUNTER — Encounter: Payer: Self-pay | Admitting: Oncology

## 2015-12-20 NOTE — Progress Notes (Signed)
sent for prior auth zytiga-faxed [employer] form 12/20/15 for prior auth

## 2015-12-21 ENCOUNTER — Other Ambulatory Visit: Payer: Self-pay | Admitting: *Deleted

## 2015-12-21 DIAGNOSIS — C61 Malignant neoplasm of prostate: Secondary | ICD-10-CM

## 2015-12-21 MED ORDER — ABIRATERONE ACETATE 250 MG PO TABS: 1000.0000 mg | ORAL_TABLET | Freq: Every day | ORAL | Status: DC

## 2015-12-21 NOTE — Telephone Encounter (Signed)
E-scribed script to [facility], notified patient.

## 2015-12-23 ENCOUNTER — Encounter: Payer: Self-pay | Admitting: Oncology

## 2015-12-23 ENCOUNTER — Telehealth: Payer: Self-pay | Admitting: *Deleted

## 2015-12-23 ENCOUNTER — Encounter: Payer: Self-pay | Admitting: *Deleted

## 2015-12-23 NOTE — Telephone Encounter (Signed)
Wife calling to say [facility] [facility] called and stated that prior auth was denied for zytiga. Lm on vm for raquel re: denial. Spoke with patient, let him know.

## 2015-12-23 NOTE — Progress Notes (Signed)
I called to ck status and per Shanna at [employer] [id number] appeal was done and it was approved 12/21/15-06/04/16 APP [id number] . I will let judy and ginna know.

## 2015-12-24 ENCOUNTER — Telehealth: Payer: Self-pay | Admitting: Pharmacist

## 2015-12-24 NOTE — Telephone Encounter (Signed)
Received communication from [place] at [place] that copay for Lucas Holloway is [phone]. Pt is eligible for a $6500 grant through patient [employer] foundation. Bethena Roys will call patient. She needs proof of income from pt.  We need to be on the look for a doctor's form for patient enrollment for this patient on our fax machine or main fax machine. MD will need to sign.  I called patient, wife - Lucas Holloway and told him the above information and to expect a call from [place] at [place]. Lucas Holloway planned to call Bethena Roys at [facility].  Will f/u 7/25.  Raul Del, PharmD, BCPS, [facility] [phone]

## 2015-12-24 NOTE — Telephone Encounter (Signed)
Received notification from [place] at [place]. Pt no longer qualifies for grant $$ due to income exceeding the specifications.  Will attempt pt assistance through [employer]. Pt will bring copy of tax documents and will sign application. MD already signed. Pt and wife will be here on 7/24 @ 9am.

## 2015-12-28 ENCOUNTER — Encounter: Payer: Self-pay | Admitting: Pharmacist

## 2015-12-28 NOTE — Progress Notes (Signed)
Oral Chemotherapy Pharmacist Encounter  Patient and wife brought in tax documents from 2016 for [employer] patient assistance program for Zytiga. Also signed application. Application will be faxed to the patient assistance foundation. Oral Chemo clinic to follow-up with patient with result upon receipt.  Lucas Holloway, PharmD, BCPS Oral Chemotherapy Clinic

## 2015-12-31 DIAGNOSIS — C7951 Secondary malignant neoplasm of bone: Secondary | ICD-10-CM | POA: Diagnosis not present

## 2015-12-31 DIAGNOSIS — C61 Malignant neoplasm of prostate: Secondary | ICD-10-CM | POA: Diagnosis not present

## 2016-01-03 ENCOUNTER — Telehealth: Payer: Self-pay | Admitting: Pharmacist

## 2016-01-03 NOTE — Telephone Encounter (Signed)
Oral Chemotherapy Pharmacist Encounter  Called [employer] patient assistance foundation for follow-up from application faxed [phone]. Per foundation, combined income of patient and wife too great to qualify for assistance. Application denied.  I called patient and spoke to wife Velva Harman to let her know about the outcome.   Dr. [name] off today, will follow-up next option for patient.  Answered questions from Mrs. Griner and verified next OV with Dr. [name] as 8/11.  Johny Drilling, PharmD, BCPS Oral Chemotherapy Clinic

## 2016-01-05 ENCOUNTER — Encounter: Payer: Self-pay | Admitting: *Deleted

## 2016-01-06 ENCOUNTER — Telehealth: Payer: Self-pay | Admitting: *Deleted

## 2016-01-06 NOTE — Telephone Encounter (Signed)
Patient would like for dr [name] to call him. He is worried that he is unable to get oral chemo at this time, d/t his financial status, and would like to consider other options that may be available. He may be reached at [phone]

## 2016-01-07 ENCOUNTER — Encounter: Payer: Self-pay | Admitting: *Deleted

## 2016-01-07 ENCOUNTER — Telehealth: Payer: Self-pay | Admitting: *Deleted

## 2016-01-07 NOTE — Telephone Encounter (Signed)
-----   Message from Wyatt Portela, MD sent at 01/07/2016  9:59 AM EDT ----- Denyse Amass Any word of his Fabio Asa yet?   If this is going to be an issue, Daune Colgate can you get him samples to start.   Thanks,   FS

## 2016-01-07 NOTE — Telephone Encounter (Signed)
Spoke with patient, zytiga  Samples left at front for patient p/u.

## 2016-01-07 NOTE — Progress Notes (Unsigned)
Patient in to p/u zytiga 500mg  samples #60. Written instructions on bottle and also explained to patient. Take 2 tablets daily, on an empty stomach. 1 hour before a meal or 2 hours after. Patient verbalized understanding.

## 2016-01-14 ENCOUNTER — Other Ambulatory Visit (HOSPITAL_BASED_OUTPATIENT_CLINIC_OR_DEPARTMENT_OTHER): Payer: Medicare Other

## 2016-01-14 ENCOUNTER — Ambulatory Visit (HOSPITAL_BASED_OUTPATIENT_CLINIC_OR_DEPARTMENT_OTHER): Payer: Medicare Other | Admitting: Oncology

## 2016-01-14 ENCOUNTER — Telehealth: Payer: Self-pay | Admitting: Oncology

## 2016-01-14 VITALS — BP 143/65 | HR 72 | Temp 97.6°F | Resp 17 | Ht 68.0 in | Wt 163.4 lb

## 2016-01-14 DIAGNOSIS — E291 Testicular hypofunction: Secondary | ICD-10-CM

## 2016-01-14 DIAGNOSIS — C61 Malignant neoplasm of prostate: Secondary | ICD-10-CM | POA: Diagnosis not present

## 2016-01-14 DIAGNOSIS — C7951 Secondary malignant neoplasm of bone: Secondary | ICD-10-CM

## 2016-01-14 LAB — COMPREHENSIVE METABOLIC PANEL
ALT: 39 U/L (ref 0–55)
ANION GAP: 9 meq/L (ref 3–11)
AST: 41 U/L — ABNORMAL HIGH (ref 5–34)
Albumin: 3.3 g/dL — ABNORMAL LOW (ref 3.5–5.0)
Alkaline Phosphatase: 139 U/L (ref 40–150)
BILIRUBIN TOTAL: 0.55 mg/dL (ref 0.20–1.20)
BUN: 16.8 mg/dL (ref 7.0–26.0)
CO2: 22 meq/L (ref 22–29)
Calcium: 9.1 mg/dL (ref 8.4–10.4)
Chloride: 104 mEq/L (ref 98–109)
Creatinine: 0.8 mg/dL (ref 0.7–1.3)
EGFR: 87 mL/min/{1.73_m2} — AB (ref >90)
Glucose: 115 mg/dl (ref 70–140)
Potassium: 4.1 mEq/L (ref 3.5–5.1)
Sodium: 136 mEq/L (ref 136–145)
TOTAL PROTEIN: 6.4 g/dL (ref 6.4–8.3)

## 2016-01-14 LAB — CBC WITH DIFFERENTIAL/PLATELET
BASO%: 0.8 % (ref 0.0–2.0)
BASOS ABS: 0 10*3/uL (ref 0.0–0.1)
EOS ABS: 0.2 10*3/uL (ref 0.0–0.5)
EOS%: 2.8 % (ref 0.0–7.0)
HCT: 41.9 % (ref 38.4–49.9)
HGB: 14.5 g/dL (ref 13.0–17.1)
LYMPH%: 26.2 % (ref 14.0–49.0)
MCH: 32 pg (ref 27.2–33.4)
MCHC: 34.6 g/dL (ref 32.0–36.0)
MCV: 92.5 fL (ref 79.3–98.0)
MONO#: 0.7 10*3/uL (ref 0.1–0.9)
MONO%: 13 % (ref 0.0–14.0)
NEUT%: 57.2 % (ref 39.0–75.0)
NEUTROS ABS: 3 10*3/uL (ref 1.5–6.5)
PLATELETS: 166 10*3/uL (ref 140–400)
RBC: 4.53 10*6/uL (ref 4.20–5.82)
RDW: 14.1 % (ref 11.0–14.6)
WBC: 5.3 10*3/uL (ref 4.0–10.3)
lymph#: 1.4 10*3/uL (ref 0.9–3.3)

## 2016-01-14 NOTE — Progress Notes (Signed)
Hematology and Oncology Follow Up Visit  Lucas Holloway:[id number] 11-27-1940 75 y.o. 01/14/2016 8:41 AM [name],ELIZABETH, MDDewey, [name], MD   Principle Diagnosis: 75 year old gentleman with castration-resistant prostate cancer with metastatic disease to the bone. He was initially diagnosed in 2009 without Gleason score 6, stage TIc and a PSA of around 9.   Prior Therapy: He underwent seed implant brachytherapy with excellent PSA response initially. His PSA nadir was 0.7 and 2013.  His PSA did go up to 1.2 in October 2016 however. His staging workup at that time including a bone scan which showed metastatic bony disease.  He was started on androgen deprivation under the care of Dr. [name] and his PSA did drop down initially to 0.61 in May 2017.  On 12/02/2015 his PSA was 1.44 with a testosterone level of 32. A repeat a bone scan obtained on 12/13/2015 showed progression of disease with increased uptake in the anterior and posterior ribs, sternum and right humerus  Current therapy:  Zytiga 1000 mg daily with prednisone 5 mg daily started on 01/07/2016. He is receiving Xgeva on a monthly basis last injection was at the end of July 2017. This was given at [facility] urology. He is currently on Lupron every 3 months last injection in July 2017.  Interim History: Mr. Papp presents today for a follow-up visit. Since the last visit, he started Zytiga in the last week and tolerated it well. He reported excellent response to therapy including resolution of his diffuse bone pain. He developed arthralgias and myalgias prior to starting this medication and the symptoms resolved. His quality of life, performance status remains excellent. He denied complications associated with it. He denied lower extremity edema, nausea, pruritus or other GI toxicities.  He does not report any headaches, blurry vision, syncope or seizures. He does not report any fevers or chills or sweats. He does not report any cough,  wheezing or hemoptysis. He does not report any nausea, vomiting or abdominal pain. He does not report any frequency urgency or hesitancy. He does not report any skeletal complaints of arthralgias myalgias. He does not report any lymphadenopathy or petechiae. Remaining review of systems unremarkable.   Medications: I have reviewed the patient's current medications.  Current Outpatient Prescriptions  Medication Sig Dispense Refill  . abiraterone Acetate (ZYTIGA) 250 MG tablet Take 4 tablets (1,000 mg total) by mouth daily. Take on an empty stomach 1 hour before or 2 hours after a meal 120 tablet 0  . calcium-vitamin D (OSCAL WITH D) 500-200 MG-UNIT per tablet Take 1 tablet by mouth 2 (two) times daily.    . Degarelix Acetate (FIRMAGON [place]) One injection a month    . Denosumab (XGEVA [place]) One injection a month    . fish oil-omega-3 fatty acids 1000 MG capsule Take 1 g by mouth 2 (two) times daily.    . folic acid (FOLVITE) 1 MG tablet Take 1 mg by mouth daily.    Marland Kitchen levothyroxine (SYNTHROID, LEVOTHROID) 75 MCG tablet Take 75 mcg by mouth daily before breakfast.   3  . meloxicam (MOBIC) 7.5 MG tablet Take 7.5 mg by mouth daily.    . methotrexate (RHEUMATREX) 2.5 MG tablet Take 15 mg by mouth once a week. Takes on sundays.Marland KitchenMarland KitchenCaution:Chemotherapy. Protect from light.    . Multiple Vitamin (MULTIVITAMIN WITH MINERALS) TABS Take 1 tablet by mouth daily.    Marland Kitchen omeprazole (PRILOSEC) 40 MG capsule Take 40 mg by mouth daily.  3  . predniSONE (DELTASONE) 5 MG tablet Take  1 tablet (5 mg total) by mouth daily with breakfast. 30 tablet 3  . simvastatin (ZOCOR) 20 MG tablet Take 1 tablet by mouth daily at 2 PM.  3  . vitamin E 400 UNIT capsule Take 400 Units by mouth daily.     No current facility-administered medications for this visit.      Allergies: No Known Allergies  Past Medical History, Surgical history, Social history, and Family History were reviewed and updated.  Physical Exam: Blood pressure (!)  143/65, pulse 72, temperature 97.6 F (36.4 C), temperature source Oral, resp. rate 17, height 5\' 8"  (1.727 m), weight 163 lb 6.4 oz (74.1 kg), SpO2 98 %. ECOG: 0 General appearance: alert and cooperative Head: Normocephalic, without obvious abnormality Neck: no adenopathy Lymph nodes: Cervical, supraclavicular, and axillary nodes normal. Heart:regular rate and rhythm, S1, S2 normal, no murmur, click, rub or gallop Lung:chest clear, no wheezing, rales, normal symmetric air entry Abdomin: soft, non-tender, without masses or organomegaly EXT:no erythema, induration, or nodules   Lab Results: Lab Results  Component Value Date   WBC 5.3 01/14/2016   HGB 14.5 01/14/2016   HCT 41.9 01/14/2016   MCV 92.5 01/14/2016   PLT 166 01/14/2016     Chemistry   No results found for: NA, K, CL, CO2, BUN, CREATININE, GLU No results found for: CALCIUM, ALKPHOS, AST, ALT, BILITOT     Impression and Plan:  75 year old gentleman with the following issues:  1. Prostate cancer initially diagnosed in 2009 without Gleason score 6, stage TIc and a PSA of around 9. He was treated with brachytherapy utilizing seed implants and developed recurrent disease in October 2016. He developed a bony metastasis and treated with androgen deprivation.    He developed castration resistant disease in June 2017 with progressive bony metastasis. He started Zytiga on 01/07/2016 and tolerated it well. He had a An excellent esponse clinically with resolution of his arthralgias and myalgias. His PSA from today is currently pending. He had difficulties obtaining this medication and we had to provide samples for him.  The plan is to continue with the same dose and schedule and follow his PSA closely. She continues to respond this medication, we will continue therapy. If her having difficulties obtaining this medication for him, he might require systemic chemotherapy sooner rather than later.  2. Bone directed therapy: He is  currently receiving Xgeva on a monthly basis under the care of Dr. [name]. He expressed obtaining Xgeva moving forward at the [facility]. He is already on calcium and vitamin D supplements. He already obtained dental clearance prior to Xgeva start in 2016. Risks and benefits of this medication were reviewed and he is agreeable to receive it at home receive the first dose on 02/04/2016.  3. Androgen depravation: I recommended to continue therapy indefinitely. He will be due for Lupron in and of October 2017. He expressed interest in continuing L[facility] therapy at the [facility].   4. Liver function test and electrolytes surveillance: These will be obtained with every visit to ensure stability.  5. Follow-up: Will be in 3 weeks to follow his clinical status.    [facility], MD 8/11/20178:41 AM

## 2016-01-14 NOTE — Telephone Encounter (Signed)
per pof to sch pt appt-gave pt co py of asvs/cal

## 2016-01-15 LAB — PSA: PROSTATE SPECIFIC AG, SERUM: 1.8 ng/mL (ref 0.0–4.0)

## 2016-01-21 ENCOUNTER — Encounter: Payer: Self-pay | Admitting: *Deleted

## 2016-02-02 DIAGNOSIS — Z0000 Encounter for general adult medical examination without abnormal findings: Secondary | ICD-10-CM | POA: Diagnosis not present

## 2016-02-02 DIAGNOSIS — Z23 Encounter for immunization: Secondary | ICD-10-CM | POA: Diagnosis not present

## 2016-02-04 ENCOUNTER — Other Ambulatory Visit (HOSPITAL_BASED_OUTPATIENT_CLINIC_OR_DEPARTMENT_OTHER): Payer: Medicare Other

## 2016-02-04 ENCOUNTER — Telehealth: Payer: Self-pay | Admitting: Oncology

## 2016-02-04 ENCOUNTER — Ambulatory Visit (HOSPITAL_BASED_OUTPATIENT_CLINIC_OR_DEPARTMENT_OTHER): Payer: Medicare Other | Admitting: Oncology

## 2016-02-04 ENCOUNTER — Ambulatory Visit (HOSPITAL_BASED_OUTPATIENT_CLINIC_OR_DEPARTMENT_OTHER): Payer: Medicare Other

## 2016-02-04 VITALS — BP 151/69 | HR 75 | Temp 97.9°F | Resp 17 | Ht 68.0 in | Wt 165.4 lb

## 2016-02-04 DIAGNOSIS — C61 Malignant neoplasm of prostate: Secondary | ICD-10-CM

## 2016-02-04 DIAGNOSIS — C7951 Secondary malignant neoplasm of bone: Secondary | ICD-10-CM

## 2016-02-04 LAB — CBC WITH DIFFERENTIAL/PLATELET
BASO%: 0.5 % (ref 0.0–2.0)
Basophils Absolute: 0 10*3/uL (ref 0.0–0.1)
EOS%: 1.2 % (ref 0.0–7.0)
Eosinophils Absolute: 0.1 10*3/uL (ref 0.0–0.5)
HCT: 45.3 % (ref 38.4–49.9)
HGB: 15 g/dL (ref 13.0–17.1)
LYMPH%: 12.5 % — AB (ref 14.0–49.0)
MCH: 31.7 pg (ref 27.2–33.4)
MCHC: 33.2 g/dL (ref 32.0–36.0)
MCV: 95.6 fL (ref 79.3–98.0)
MONO#: 0.5 10*3/uL (ref 0.1–0.9)
MONO%: 7.5 % (ref 0.0–14.0)
NEUT#: 5.3 10*3/uL (ref 1.5–6.5)
NEUT%: 78.3 % — AB (ref 39.0–75.0)
PLATELETS: 201 10*3/uL (ref 140–400)
RBC: 4.74 10*6/uL (ref 4.20–5.82)
RDW: 14.5 % (ref 11.0–14.6)
WBC: 6.8 10*3/uL (ref 4.0–10.3)
lymph#: 0.8 10*3/uL — ABNORMAL LOW (ref 0.9–3.3)

## 2016-02-04 LAB — COMPREHENSIVE METABOLIC PANEL
ALT: 45 U/L (ref 0–55)
ANION GAP: 8 meq/L (ref 3–11)
AST: 50 U/L — ABNORMAL HIGH (ref 5–34)
Albumin: 3.6 g/dL (ref 3.5–5.0)
Alkaline Phosphatase: 148 U/L (ref 40–150)
BUN: 20.4 mg/dL (ref 7.0–26.0)
CHLORIDE: 103 meq/L (ref 98–109)
CO2: 25 meq/L (ref 22–29)
Calcium: 9.1 mg/dL (ref 8.4–10.4)
Creatinine: 0.9 mg/dL (ref 0.7–1.3)
EGFR: 83 mL/min/{1.73_m2} — ABNORMAL LOW (ref >90)
Glucose: 114 mg/dl (ref 70–140)
POTASSIUM: 4.9 meq/L (ref 3.5–5.1)
Sodium: 136 mEq/L (ref 136–145)
Total Bilirubin: 0.34 mg/dL (ref 0.20–1.20)
Total Protein: 6.9 g/dL (ref 6.4–8.3)

## 2016-02-04 MED ORDER — DENOSUMAB 120 MG/1.7ML ~~LOC~~ SOLN
120.0000 mg | Freq: Once | SUBCUTANEOUS | Status: AC
  Administered 2016-02-04: 120 mg via SUBCUTANEOUS
  Filled 2016-02-04: qty 1.7

## 2016-02-04 NOTE — Telephone Encounter (Signed)
AVS REPORT AND APPT SCHD GIVEN PER 02/04/16 LOS.

## 2016-02-04 NOTE — Progress Notes (Signed)
Hematology and Oncology Follow Up Visit  Lucas Holloway [id number] [birth year] 75 y.o. 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 He was initially diagnosed in 2009 without Gleason score 6, stage TIc and a PSA of around 9.   Prior Therapy: He underwent seed implant brachytherapy with excellent PSA response initially. His PSA nadir was 0.7 and 2013.  His PSA did go up to 1.2 in October 2016 however. His staging workup at that time including a bone scan which showed metastatic bony disease.  He was started on androgen deprivation under the care of Dr. [name] and his PSA did drop down initially to 0.61 in May 2017.  On 12/02/2015 his PSA was 1.44 with a testosterone level of 32. A repeat a bone scan obtained on 12/13/2015 showed progression of disease with increased uptake in the anterior and posterior ribs, sternum and right humerus  Current therapy:  Zytiga 1000 mg daily with prednisone 5 mg daily started on 01/07/2016. He is receiving Xgeva on a monthly basis last injection was at the end of July 2017. This was given at [facility]. He is currently on Lupron every 3 months last injection in July 2017.  Interim History: Lucas Holloway presents today for a follow-up visit. Since the last visit, he reports continuous improvement in his health. He reports his bone pain have resolved at this time and no longer reporting arthralgias and myalgias. He continues to take Zytiga without any new complications. He denied any lower extremity edema, nausea or vomiting. His quality of life, performance status remains excellent.   He does not report any headaches, blurry vision, syncope or seizures. He does not report any fevers or chills or sweats. He does not report any cough, wheezing or hemoptysis. He does not report any nausea, vomiting or abdominal pain. He does not report  any frequency urgency or hesitancy. He does not report any skeletal complaints of arthralgias myalgias. He does not report any lymphadenopathy or petechiae. Remaining review of systems unremarkable.   Medications: I have reviewed the patient's current medications.  Current Outpatient Prescriptions  Medication Sig Dispense Refill  . abiraterone Acetate (ZYTIGA) 250 MG tablet Take 4 tablets (1,000 mg total) by mouth daily. Take on an empty stomach 1 hour before or 2 hours after a meal 120 tablet 0  . calcium-vitamin D (OSCAL WITH D) 500-200 MG-UNIT per tablet Take 1 tablet by mouth 2 (two) times daily.    . cholecalciferol (VITAMIN D) 1000 units tablet Take 1,000 Units by mouth daily. Patient takes 6 tablets daily    . Degarelix Acetate (FIRMAGON [place]) One injection a month    . Denosumab (XGEVA [place]) One injection a month    . fish oil-omega-3 fatty acids 1000 MG capsule Take 1 g by mouth daily.     . folic acid (FOLVITE) 1 MG tablet Take 1 mg by mouth daily.    Marland Kitchen levothyroxine (SYNTHROID, LEVOTHROID) 75 MCG tablet Take 75 mcg by mouth daily before breakfast.   3  . meloxicam (MOBIC) 7.5 MG tablet Take 7.5 mg by mouth daily.    . methotrexate (RHEUMATREX) 2.5 MG tablet Take 15 mg by mouth once a week. Takes on sundays.Marland KitchenMarland KitchenCaution:Chemotherapy. Protect from light.    . Multiple Vitamin (MULTIVITAMIN WITH MINERALS) TABS Take 1 tablet by mouth daily.    Marland Kitchen omeprazole (PRILOSEC) 40 MG capsule Take 40 mg by mouth daily.  3  .  predniSONE (DELTASONE) 5 MG tablet Take 1 tablet (5 mg total) by mouth daily with breakfast. 30 tablet 3  . simvastatin (ZOCOR) 20 MG tablet Take 1 tablet by mouth daily at 2 PM.  3  . vitamin E 400 UNIT capsule Take 800 Units by mouth daily.      No current facility-administered medications for this visit.      Allergies: No Known Allergies  Past Medical History, Surgical history, Social history, and Family History were reviewed and updated.  Physical Exam: Blood pressure (!)  151/69, pulse 75, temperature 97.9 F (36.6 C), temperature source Oral, resp. rate 17, height 5\' 8"  (1.727 m), weight 165 lb 6.4 oz (75 kg), SpO2 98 %. ECOG: 0 General appearance: alert and cooperative Head: Normocephalic, without obvious abnormality Neck: no adenopathy Lymph nodes: Cervical, supraclavicular, and axillary nodes normal. Heart:regular rate and rhythm, S1, S2 normal, no murmur, click, rub or gallop Lung:chest clear, no wheezing, rales, normal symmetric air entry Abdomin: soft, non-tender, without masses or organomegaly EXT:no erythema, induration, or nodules   Lab Results: Lab Results  Component Value Date   WBC 6.8 02/04/2016   HGB 15.0 02/04/2016   HCT 45.3 02/04/2016   MCV 95.6 02/04/2016   PLT 201 02/04/2016     Chemistry      Component Value Date/Time   NA 136 01/14/2016 0805   K 4.1 01/14/2016 0805   CO2 22 01/14/2016 0805   BUN 16.8 01/14/2016 0805   CREATININE 0.8 01/14/2016 0805      Component Value Date/Time   CALCIUM 9.1 01/14/2016 0805   ALKPHOS 139 01/14/2016 0805   AST 41 (H) 01/14/2016 0805   ALT 39 01/14/2016 0805   BILITOT 0.55 01/14/2016 0805     Results for Lucas Holloway (MRN [id number]) as of 02/04/2016 15:25  Ref. Range 01/14/2016 08:05  PSA Latest Ref Range: 0.0 - 4.0 ng/mL 1.8    Impression and Plan:  75 year old gentleman with the following issues:  1. Prostate cancer initially diagnosed in 2009 without Gleason score 6, stage TIc and a PSA of around 9. He was treated with brachytherapy utilizing seed implants and developed recurrent disease in October 2016. He developed a bony metastasis and treated with androgen deprivation.    He developed castration resistant disease in June 2017 with progressive bony metastasis.   He started Zytiga on 01/07/2016 and tolerated it well. He continues to have excellent response clinically with improvement in his bone pain and decline of PSA to 1.8.  The plan is to continue with the same dose and  schedule and follow his PSA closely. He is currently receiving samples of Zytiga as he cannot afford his co-pay at this time.  2. Bone directed therapy: He will receive Xgeva today. Risks and benefits were discussed today and agreeable to proceed. He is on calcium supplement and dental complications including osteonecrosis of the jaw were reviewed.   3. Androgen depravation: I recommended to continue therapy indefinitely. He will be due for Lupron in and of October 2017. He expressed interest in continuing Lupron therapy at the [facility].   4. Liver function test and electrolytes surveillance: These will be obtained with every visit to ensure stability.  5. Follow-up: Will be in 4 weeks to follow his clinical status.    Zola Button, MD 9/1/20173:44 PM

## 2016-02-04 NOTE — Progress Notes (Signed)
patient given bottle of zytiga samples 500 mg. # 60. Take 2 tabs daily, on an empty stomach daily

## 2016-02-04 NOTE — Patient Instructions (Signed)
Denosumab injection  What is this medicine?  DENOSUMAB (den oh sue mab) slows bone breakdown. Prolia is used to treat osteoporosis in women after menopause and in men. Xgeva is used to prevent bone fractures and other bone problems caused by cancer bone metastases. Xgeva is also used to treat giant cell tumor of the bone.  This medicine may be used for other purposes; ask your health care provider or pharmacist if you have questions.  What should I tell my health care provider before I take this medicine?  They need to know if you have any of these conditions:  -dental disease  -eczema  -infection or history of infections  -kidney disease or on dialysis  -low blood calcium or vitamin D  -malabsorption syndrome  -scheduled to have surgery or tooth extraction  -taking medicine that contains denosumab  -thyroid or parathyroid disease  -an unusual reaction to denosumab, other medicines, foods, dyes, or preservatives  -pregnant or trying to get pregnant  -breast-feeding  How should I use this medicine?  This medicine is for injection under the skin. It is given by a health care professional in a hospital or clinic setting.  If you are getting Prolia, a special MedGuide will be given to you by the pharmacist with each prescription and refill. Be sure to read this information carefully each time.  For Prolia, talk to your pediatrician regarding the use of this medicine in children. Special care may be needed. For Xgeva, talk to your pediatrician regarding the use of this medicine in children. While this drug may be prescribed for children as young as 13 years for selected conditions, precautions do apply.  Overdosage: If you think you have taken too much of this medicine contact a poison control center or emergency room at once.  NOTE: This medicine is only for you. Do not share this medicine with others.  What if I miss a dose?  It is important not to miss your dose. Call your doctor or health care professional if you are  unable to keep an appointment.  What may interact with this medicine?  Do not take this medicine with any of the following medications:  -other medicines containing denosumab  This medicine may also interact with the following medications:  -medicines that suppress the immune system  -medicines that treat cancer  -steroid medicines like prednisone or cortisone  This list may not describe all possible interactions. Give your health care provider a list of all the medicines, herbs, non-prescription drugs, or dietary supplements you use. Also tell them if you smoke, drink alcohol, or use illegal drugs. Some items may interact with your medicine.  What should I watch for while using this medicine?  Visit your doctor or health care professional for regular checks on your progress. Your doctor or health care professional may order blood tests and other tests to see how you are doing.  Call your doctor or health care professional if you get a cold or other infection while receiving this medicine. Do not treat yourself. This medicine may decrease your body's ability to fight infection.  You should make sure you get enough calcium and vitamin D while you are taking this medicine, unless your doctor tells you not to. Discuss the foods you eat and the vitamins you take with your health care professional.  See your dentist regularly. Brush and floss your teeth as directed. Before you have any dental work done, tell your dentist you are receiving this medicine.  Do   not become pregnant while taking this medicine or for 5 months after stopping it. Women should inform their doctor if they wish to become pregnant or think they might be pregnant. There is a potential for serious side effects to an unborn child. Talk to your health care professional or pharmacist for more information.  What side effects may I notice from receiving this medicine?  Side effects that you should report to your doctor or health care professional as soon as  possible:  -allergic reactions like skin rash, itching or hives, swelling of the face, lips, or tongue  -breathing problems  -chest pain  -fast, irregular heartbeat  -feeling faint or lightheaded, falls  -fever, chills, or any other sign of infection  -muscle spasms, tightening, or twitches  -numbness or tingling  -skin blisters or bumps, or is dry, peels, or red  -slow healing or unexplained pain in the mouth or jaw  -unusual bleeding or bruising  Side effects that usually do not require medical attention (Report these to your doctor or health care professional if they continue or are bothersome.):  -muscle pain  -stomach upset, gas  This list may not describe all possible side effects. Call your doctor for medical advice about side effects. You may report side effects to FDA at 1-800-FDA-1088.  Where should I keep my medicine?  This medicine is only given in a clinic, doctor's office, or other health care setting and will not be stored at home.  NOTE: This sheet is a summary. It may not cover all possible information. If you have questions about this medicine, talk to your doctor, pharmacist, or health care provider.      2016, [employer]vier/Gold Standard. (2011-11-20 12:37:47)

## 2016-02-05 LAB — PSA: Prostate Specific Ag, Serum: 2.3 ng/mL (ref 0.0–4.0)

## 2016-02-08 DIAGNOSIS — Z23 Encounter for immunization: Secondary | ICD-10-CM | POA: Diagnosis not present

## 2016-02-09 ENCOUNTER — Other Ambulatory Visit: Payer: Self-pay | Admitting: Oncology

## 2016-02-09 DIAGNOSIS — C7951 Secondary malignant neoplasm of bone: Secondary | ICD-10-CM | POA: Insufficient documentation

## 2016-02-11 DIAGNOSIS — L4059 Other psoriatic arthropathy: Secondary | ICD-10-CM | POA: Diagnosis not present

## 2016-02-16 ENCOUNTER — Telehealth: Payer: Self-pay | Admitting: Oncology

## 2016-02-16 ENCOUNTER — Telehealth: Payer: Self-pay | Admitting: *Deleted

## 2016-02-16 NOTE — Telephone Encounter (Signed)
Patient called requesting PSA results from last week. I tried calling his mobile number that he left but am unable to leave a message. PSA is 2.3. It is slightly up from 1.8 but still low.

## 2016-02-16 NOTE — Telephone Encounter (Signed)
Called patient to confirm adjusted appointment time. Appts confirmed. 02/16/16

## 2016-02-24 ENCOUNTER — Other Ambulatory Visit: Payer: Self-pay | Admitting: Neurological Surgery

## 2016-02-24 DIAGNOSIS — D352 Benign neoplasm of pituitary gland: Secondary | ICD-10-CM

## 2016-02-28 ENCOUNTER — Other Ambulatory Visit (HOSPITAL_BASED_OUTPATIENT_CLINIC_OR_DEPARTMENT_OTHER): Payer: Medicare Other

## 2016-02-28 ENCOUNTER — Ambulatory Visit (HOSPITAL_BASED_OUTPATIENT_CLINIC_OR_DEPARTMENT_OTHER): Payer: Medicare Other

## 2016-02-28 ENCOUNTER — Telehealth: Payer: Self-pay | Admitting: Oncology

## 2016-02-28 ENCOUNTER — Ambulatory Visit (HOSPITAL_BASED_OUTPATIENT_CLINIC_OR_DEPARTMENT_OTHER): Payer: Medicare Other | Admitting: Oncology

## 2016-02-28 ENCOUNTER — Ambulatory Visit: Payer: Medicare Other

## 2016-02-28 VITALS — BP 152/78 | HR 72 | Temp 97.5°F | Resp 18 | Ht 68.0 in | Wt 165.5 lb

## 2016-02-28 DIAGNOSIS — C61 Malignant neoplasm of prostate: Secondary | ICD-10-CM

## 2016-02-28 DIAGNOSIS — E291 Testicular hypofunction: Secondary | ICD-10-CM

## 2016-02-28 DIAGNOSIS — C7951 Secondary malignant neoplasm of bone: Secondary | ICD-10-CM

## 2016-02-28 LAB — COMPREHENSIVE METABOLIC PANEL
ALT: 40 U/L (ref 0–55)
ANION GAP: 10 meq/L (ref 3–11)
AST: 42 U/L — AB (ref 5–34)
Albumin: 3.4 g/dL — ABNORMAL LOW (ref 3.5–5.0)
Alkaline Phosphatase: 100 U/L (ref 40–150)
BUN: 16.8 mg/dL (ref 7.0–26.0)
CHLORIDE: 104 meq/L (ref 98–109)
CO2: 25 meq/L (ref 22–29)
CREATININE: 0.9 mg/dL (ref 0.7–1.3)
Calcium: 9 mg/dL (ref 8.4–10.4)
EGFR: 85 mL/min/{1.73_m2} — ABNORMAL LOW (ref >90)
GLUCOSE: 86 mg/dL (ref 70–140)
Potassium: 4.4 mEq/L (ref 3.5–5.1)
SODIUM: 138 meq/L (ref 136–145)
Total Bilirubin: 0.56 mg/dL (ref 0.20–1.20)
Total Protein: 6.8 g/dL (ref 6.4–8.3)

## 2016-02-28 LAB — CBC WITH DIFFERENTIAL/PLATELET
BASO%: 1 % (ref 0.0–2.0)
Basophils Absolute: 0.1 10*3/uL (ref 0.0–0.1)
EOS%: 3.3 % (ref 0.0–7.0)
Eosinophils Absolute: 0.2 10*3/uL (ref 0.0–0.5)
HCT: 45.2 % (ref 38.4–49.9)
HGB: 15.2 g/dL (ref 13.0–17.1)
LYMPH%: 21.7 % (ref 14.0–49.0)
MCH: 31.9 pg (ref 27.2–33.4)
MCHC: 33.7 g/dL (ref 32.0–36.0)
MCV: 94.9 fL (ref 79.3–98.0)
MONO#: 0.7 10*3/uL (ref 0.1–0.9)
MONO%: 12.4 % (ref 0.0–14.0)
NEUT%: 61.6 % (ref 39.0–75.0)
NEUTROS ABS: 3.3 10*3/uL (ref 1.5–6.5)
Platelets: 192 10*3/uL (ref 140–400)
RBC: 4.76 10*6/uL (ref 4.20–5.82)
RDW: 15 % — ABNORMAL HIGH (ref 11.0–14.6)
WBC: 5.4 10*3/uL (ref 4.0–10.3)
lymph#: 1.2 10*3/uL (ref 0.9–3.3)

## 2016-02-28 MED ORDER — DENOSUMAB 120 MG/1.7ML ~~LOC~~ SOLN
120.0000 mg | Freq: Once | SUBCUTANEOUS | Status: AC
  Administered 2016-02-28: 120 mg via SUBCUTANEOUS
  Filled 2016-02-28: qty 1.7

## 2016-02-28 NOTE — Progress Notes (Signed)
Hematology and Oncology Follow Up Visit  Lucas Holloway [id number] 07-Nov-1940 75 y.o. 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 He was initially diagnosed in 2009 without Gleason score 6, stage TIc and a PSA of around 9.   Prior Therapy: He underwent seed implant brachytherapy with excellent PSA response initially. His PSA nadir was 0.7 and 2013.  His PSA did go up to 1.2 in October 2016 however. His staging workup at that time including a bone scan which showed metastatic bony disease.  He was started on androgen deprivation under the care of Lucas Holloway and his PSA did drop down initially to 0.61 in May 2017.  On 12/02/2015 his PSA was 1.44 with a testosterone level of 32. A repeat a bone scan obtained on 12/13/2015 showed progression of disease with increased uptake in the anterior and posterior ribs, sternum and right humerus  Current therapy:  Zytiga 1000 mg daily with prednisone 5 mg daily started on 01/07/2016. He is receiving Xgeva on a monthly basis last injection was at the end of July 2017.  He is currently on Lupron every 3 months last injection in July 2017. This will be repeated in October 2017.  Interim History: Lucas Holloway presents today for a follow-up visit. Since the last visit, he continues to report improvement in his symptoms. He reports dramatic improvement in his bone pain and myalgias. He denied any pathological fractures, back pain or neuropathy. His quality of life, performance status remains excellent. He denied complications associated with it. He denied lower extremity edema, nausea, pruritus or other GI toxicities. His appetite is excellent and he continues to attend activities of daily living.  He does not report any headaches, blurry vision, syncope or seizures. He does not report any fevers or chills or sweats. He does not report  any cough, wheezing or hemoptysis. He does not report any nausea, vomiting or abdominal pain. He does not report any frequency urgency or hesitancy. He does not report any skeletal complaints of arthralgias myalgias. He does not report any lymphadenopathy or petechiae. Remaining review of systems unremarkable.   Medications: I have reviewed the patient's current medications.  Current Outpatient Prescriptions  Medication Sig Dispense Refill  . Abiraterone Acetate (ZYTIGA) 500 MG TABS Take 1,000 mg by mouth daily.    . calcium-vitamin D (OSCAL WITH D) 500-200 MG-UNIT per tablet Take 1 tablet by mouth 2 (two) times daily.    . cholecalciferol (VITAMIN D) 1000 units tablet Take 1,000 Units by mouth daily. Patient takes 6 tablets daily    . Degarelix Acetate (FIRMAGON [place]) One injection a month    . Denosumab (XGEVA [place]) One injection a month    . fish oil-omega-3 fatty acids 1000 MG capsule Take 1 g by mouth daily.     . folic acid (FOLVITE) 1 MG tablet Take 1 mg by mouth daily.    Marland Kitchen levothyroxine (SYNTHROID, LEVOTHROID) 75 MCG tablet Take 75 mcg by mouth daily before breakfast.   3  . meloxicam (MOBIC) 7.5 MG tablet Take 7.5 mg by mouth daily.    . methotrexate (RHEUMATREX) 2.5 MG tablet Take 15 mg by mouth once a week. Takes on sundays.Marland KitchenMarland KitchenCaution:Chemotherapy. Protect from light.    . Multiple Vitamin (MULTIVITAMIN WITH MINERALS) TABS Take 1 tablet by mouth daily.    Marland Kitchen omeprazole (PRILOSEC) 40 MG capsule Take 40 mg by mouth daily.  3  . predniSONE (  DELTASONE) 5 MG tablet Take 1 tablet (5 mg total) by mouth daily with breakfast. 30 tablet 3  . simvastatin (ZOCOR) 20 MG tablet Take 1 tablet by mouth daily at 2 PM.  3  . vitamin E 400 UNIT capsule Take 800 Units by mouth daily.      No current facility-administered medications for this visit.      Allergies: No Known Allergies  Past Medical History, Surgical history, Social history, and Family History were reviewed and updated.  Physical  Exam: Blood pressure (!) 152/78, pulse 72, temperature 97.5 F (36.4 C), temperature source Oral, resp. rate 18, height 5\' 8"  (1.727 m), weight 165 lb 8 oz (75.1 kg), SpO2 98 %. ECOG: 0 General appearance: alert and cooperative appeared without distress. Head: Normocephalic, without obvious abnormality no oral ulcers or lesions. Neck: no adenopathy Lymph nodes: Cervical, supraclavicular, and axillary nodes normal. Heart:regular rate and rhythm, S1, S2 normal, no murmur, click, rub or gallop Lung:chest clear, no wheezing, rales, normal symmetric air entry Abdomin: soft, non-tender, without masses or organomegaly no rebound or guarding. EXT:no erythema, induration, or nodules   Lab Results: Lab Results  Component Value Date   WBC 5.4 02/28/2016   HGB 15.2 02/28/2016   HCT 45.2 02/28/2016   MCV 94.9 02/28/2016   PLT 192 02/28/2016     Chemistry      Component Value Date/Time   NA 136 02/04/2016 1505   K 4.9 02/04/2016 1505   CO2 25 02/04/2016 1505   BUN 20.4 02/04/2016 1505   CREATININE 0.9 02/04/2016 1505      Component Value Date/Time   CALCIUM 9.1 02/04/2016 1505   ALKPHOS 148 02/04/2016 1505   AST 50 (H) 02/04/2016 1505   ALT 45 02/04/2016 1505   BILITOT 0.34 02/04/2016 1505     Results for Lucas, Holloway (MRN [id number]) as of 02/28/2016 08:13  Ref. Range 01/14/2016 08:05 02/04/2016 15:05  PSA Latest Ref Range: 0.0 - 4.0 ng/mL 1.8 2.3    Impression and Plan:  75 year old gentleman with the following issues:  1. Prostate cancer initially diagnosed in 2009 without Gleason score 6, stage TIc and a PSA of around 9. He was treated with brachytherapy utilizing seed implants and developed recurrent disease in October 2016. He developed a bony metastasis and treated with androgen deprivation.    He developed castration resistant disease in June 2017 with progressive bony metastasis. He started Zytiga on 01/07/2016 and tolerated it well. He continues to have excellent clinical  response with improvement in his bone pain and arthralgias. Despite the slight increase in his PSA from 1.8 to 2.3, but is to continue with the same dose and schedule.  2. Bone directed therapy: He is currently receiving Xgeva on a monthly basis. He is on calcium and vitamin D supplements. He already obtained dental clearance prior to Xgeva start in 2016. Risks and benefits of continuing this medication were reviewed and he is agreeable to receive it. He will continue to receive this on a monthly basis.  3. Androgen depravation: I recommended to continue therapy indefinitely. He will be due for Lupron in and of October 2017. He'll receive Lupron 22.5 mg on 03/29/2016 and every 3 months after that.  4. Liver function test and electrolytes surveillance: These will be obtained with every visit to ensure stability.  5. Follow-up: Will be in 4 weeks to follow his clinical status.    [facility], Lucas Holloway 9/25/20178:28 AM

## 2016-02-28 NOTE — Patient Instructions (Signed)
Denosumab injection  What is this medicine?  DENOSUMAB (den oh sue mab) slows bone breakdown. Prolia is used to treat osteoporosis in women after menopause and in men. Xgeva is used to prevent bone fractures and other bone problems caused by cancer bone metastases. Xgeva is also used to treat giant cell tumor of the bone.  This medicine may be used for other purposes; ask your health care provider or pharmacist if you have questions.  What should I tell my health care provider before I take this medicine?  They need to know if you have any of these conditions:  -dental disease  -eczema  -infection or history of infections  -kidney disease or on dialysis  -low blood calcium or vitamin D  -malabsorption syndrome  -scheduled to have surgery or tooth extraction  -taking medicine that contains denosumab  -thyroid or parathyroid disease  -an unusual reaction to denosumab, other medicines, foods, dyes, or preservatives  -pregnant or trying to get pregnant  -breast-feeding  How should I use this medicine?  This medicine is for injection under the skin. It is given by a health care professional in a hospital or clinic setting.  If you are getting Prolia, a special MedGuide will be given to you by the pharmacist with each prescription and refill. Be sure to read this information carefully each time.  For Prolia, talk to your pediatrician regarding the use of this medicine in children. Special care may be needed. For Xgeva, talk to your pediatrician regarding the use of this medicine in children. While this drug may be prescribed for children as young as 13 years for selected conditions, precautions do apply.  Overdosage: If you think you have taken too much of this medicine contact a poison control center or emergency room at once.  NOTE: This medicine is only for you. Do not share this medicine with others.  What if I miss a dose?  It is important not to miss your dose. Call your doctor or health care professional if you are  unable to keep an appointment.  What may interact with this medicine?  Do not take this medicine with any of the following medications:  -other medicines containing denosumab  This medicine may also interact with the following medications:  -medicines that suppress the immune system  -medicines that treat cancer  -steroid medicines like prednisone or cortisone  This list may not describe all possible interactions. Give your health care provider a list of all the medicines, herbs, non-prescription drugs, or dietary supplements you use. Also tell them if you smoke, drink alcohol, or use illegal drugs. Some items may interact with your medicine.  What should I watch for while using this medicine?  Visit your doctor or health care professional for regular checks on your progress. Your doctor or health care professional may order blood tests and other tests to see how you are doing.  Call your doctor or health care professional if you get a cold or other infection while receiving this medicine. Do not treat yourself. This medicine may decrease your body's ability to fight infection.  You should make sure you get enough calcium and vitamin D while you are taking this medicine, unless your doctor tells you not to. Discuss the foods you eat and the vitamins you take with your health care professional.  See your dentist regularly. Brush and floss your teeth as directed. Before you have any dental work done, tell your dentist you are receiving this medicine.  Do   not become pregnant while taking this medicine or for 5 months after stopping it. Women should inform their doctor if they wish to become pregnant or think they might be pregnant. There is a potential for serious side effects to an unborn child. Talk to your health care professional or pharmacist for more information.  What side effects may I notice from receiving this medicine?  Side effects that you should report to your doctor or health care professional as soon as  possible:  -allergic reactions like skin rash, itching or hives, swelling of the face, lips, or tongue  -breathing problems  -chest pain  -fast, irregular heartbeat  -feeling faint or lightheaded, falls  -fever, chills, or any other sign of infection  -muscle spasms, tightening, or twitches  -numbness or tingling  -skin blisters or bumps, or is dry, peels, or red  -slow healing or unexplained pain in the mouth or jaw  -unusual bleeding or bruising  Side effects that usually do not require medical attention (Report these to your doctor or health care professional if they continue or are bothersome.):  -muscle pain  -stomach upset, gas  This list may not describe all possible side effects. Call your doctor for medical advice about side effects. You may report side effects to FDA at 1-800-FDA-1088.  Where should I keep my medicine?  This medicine is only given in a clinic, doctor's office, or other health care setting and will not be stored at home.  NOTE: This sheet is a summary. It may not cover all possible information. If you have questions about this medicine, talk to your doctor, pharmacist, or health care provider.      2016, [employer]vier/Gold Standard. (2011-11-20 12:37:47)

## 2016-02-28 NOTE — Telephone Encounter (Signed)
Gave patient avs report and appointments for October  °

## 2016-02-29 ENCOUNTER — Telehealth: Payer: Self-pay | Admitting: *Deleted

## 2016-02-29 LAB — PSA: PROSTATE SPECIFIC AG, SERUM: 1.6 ng/mL (ref 0.0–4.0)

## 2016-02-29 NOTE — Progress Notes (Signed)
Spoke with patient, gave results of last PSA 

## 2016-02-29 NOTE — Telephone Encounter (Signed)
Attempted times 3 to reach patient to give results of last PSA. Home phone cuts off and he is no longer at work #.

## 2016-02-29 NOTE — Telephone Encounter (Signed)
-----   Message from Wyatt Portela, MD sent at 02/29/2016  8:04 AM EDT ----- Please let him know his PSA is still low.

## 2016-03-20 ENCOUNTER — Ambulatory Visit
Admission: RE | Admit: 2016-03-20 | Discharge: 2016-03-20 | Disposition: A | Discharge status: Home / Self Care | Payer: Medicare Other | Source: Ambulatory Visit | Attending: Neurological Surgery | Admitting: Neurological Surgery

## 2016-03-20 DIAGNOSIS — D352 Benign neoplasm of pituitary gland: Secondary | ICD-10-CM

## 2016-03-20 MED ORDER — GADOBENATE DIMEGLUMINE 529 MG/ML IV SOLN
10.0000 mL | Freq: Once | INTRAVENOUS | Status: AC | PRN
  Administered 2016-03-20: 10 mL via INTRAVENOUS

## 2016-03-27 DIAGNOSIS — R351 Nocturia: Secondary | ICD-10-CM | POA: Diagnosis not present

## 2016-03-27 DIAGNOSIS — C61 Malignant neoplasm of prostate: Secondary | ICD-10-CM | POA: Diagnosis not present

## 2016-03-29 ENCOUNTER — Ambulatory Visit (HOSPITAL_BASED_OUTPATIENT_CLINIC_OR_DEPARTMENT_OTHER): Payer: Medicare Other

## 2016-03-29 ENCOUNTER — Encounter: Payer: Self-pay | Admitting: Pharmacist

## 2016-03-29 ENCOUNTER — Other Ambulatory Visit (HOSPITAL_BASED_OUTPATIENT_CLINIC_OR_DEPARTMENT_OTHER): Payer: Medicare Other

## 2016-03-29 ENCOUNTER — Ambulatory Visit (HOSPITAL_BASED_OUTPATIENT_CLINIC_OR_DEPARTMENT_OTHER): Payer: Medicare Other | Admitting: Oncology

## 2016-03-29 ENCOUNTER — Telehealth: Payer: Self-pay | Admitting: Oncology

## 2016-03-29 VITALS — BP 133/66 | HR 78 | Temp 97.5°F | Resp 18 | Ht 68.0 in | Wt 165.9 lb

## 2016-03-29 DIAGNOSIS — C7951 Secondary malignant neoplasm of bone: Secondary | ICD-10-CM

## 2016-03-29 DIAGNOSIS — C61 Malignant neoplasm of prostate: Secondary | ICD-10-CM | POA: Diagnosis not present

## 2016-03-29 DIAGNOSIS — Z5111 Encounter for antineoplastic chemotherapy: Secondary | ICD-10-CM | POA: Diagnosis present

## 2016-03-29 DIAGNOSIS — E291 Testicular hypofunction: Secondary | ICD-10-CM

## 2016-03-29 LAB — COMPREHENSIVE METABOLIC PANEL
ALT: 41 U/L (ref 0–55)
AST: 46 U/L — AB (ref 5–34)
Albumin: 3.4 g/dL — ABNORMAL LOW (ref 3.5–5.0)
Alkaline Phosphatase: 80 U/L (ref 40–150)
Anion Gap: 7 mEq/L (ref 3–11)
BUN: 17.4 mg/dL (ref 7.0–26.0)
CALCIUM: 9 mg/dL (ref 8.4–10.4)
CHLORIDE: 101 meq/L (ref 98–109)
CO2: 29 meq/L (ref 22–29)
Creatinine: 0.9 mg/dL (ref 0.7–1.3)
EGFR: 85 mL/min/{1.73_m2} — ABNORMAL LOW (ref >90)
Glucose: 80 mg/dl (ref 70–140)
POTASSIUM: 4.2 meq/L (ref 3.5–5.1)
Sodium: 137 mEq/L (ref 136–145)
Total Bilirubin: 0.81 mg/dL (ref 0.20–1.20)
Total Protein: 6.6 g/dL (ref 6.4–8.3)

## 2016-03-29 LAB — CBC WITH DIFFERENTIAL/PLATELET
BASO%: 0.5 % (ref 0.0–2.0)
BASOS ABS: 0 10*3/uL (ref 0.0–0.1)
EOS%: 2.8 % (ref 0.0–7.0)
Eosinophils Absolute: 0.2 10*3/uL (ref 0.0–0.5)
HEMATOCRIT: 46.1 % (ref 38.4–49.9)
HGB: 15.7 g/dL (ref 13.0–17.1)
LYMPH#: 1.3 10*3/uL (ref 0.9–3.3)
LYMPH%: 21.7 % (ref 14.0–49.0)
MCH: 32.5 pg (ref 27.2–33.4)
MCHC: 34 g/dL (ref 32.0–36.0)
MCV: 95.6 fL (ref 79.3–98.0)
MONO#: 0.7 10*3/uL (ref 0.1–0.9)
MONO%: 11.7 % (ref 0.0–14.0)
NEUT#: 3.8 10*3/uL (ref 1.5–6.5)
NEUT%: 63.3 % (ref 39.0–75.0)
Platelets: 205 10*3/uL (ref 140–400)
RBC: 4.82 10*6/uL (ref 4.20–5.82)
RDW: 14.4 % (ref 11.0–14.6)
WBC: 6 10*3/uL (ref 4.0–10.3)

## 2016-03-29 MED ORDER — LEUPROLIDE ACETATE (3 MONTH) 22.5 MG IM KIT
22.5000 mg | PACK | Freq: Once | INTRAMUSCULAR | Status: AC
  Administered 2016-03-29: 22.5 mg via INTRAMUSCULAR
  Filled 2016-03-29: qty 22.5

## 2016-03-29 MED ORDER — DENOSUMAB 120 MG/1.7ML ~~LOC~~ SOLN
120.0000 mg | Freq: Once | SUBCUTANEOUS | Status: AC
  Administered 2016-03-29: 120 mg via SUBCUTANEOUS
  Filled 2016-03-29: qty 1.7

## 2016-03-29 NOTE — Patient Instructions (Signed)
Denosumab injection What is this medicine? DENOSUMAB (den oh sue mab) slows bone breakdown. Prolia is used to treat osteoporosis in women after menopause and in men. Xgeva is used to prevent bone fractures and other bone problems caused by cancer bone metastases. Xgeva is also used to treat giant cell tumor of the bone. This medicine may be used for other purposes; ask your health care provider or pharmacist if you have questions. What should I tell my health care provider before I take this medicine? They need to know if you have any of these conditions: -dental disease -eczema -infection or history of infections -kidney disease or on dialysis -low blood calcium or vitamin D -malabsorption syndrome -scheduled to have surgery or tooth extraction -taking medicine that contains denosumab -thyroid or parathyroid disease -an unusual reaction to denosumab, other medicines, foods, dyes, or preservatives -pregnant or trying to get pregnant -breast-feeding How should I use this medicine? This medicine is for injection under the skin. It is given by a health care professional in a hospital or clinic setting. If you are getting Prolia, a special MedGuide will be given to you by the pharmacist with each prescription and refill. Be sure to read this information carefully each time. For Prolia, talk to your pediatrician regarding the use of this medicine in children. Special care may be needed. For Xgeva, talk to your pediatrician regarding the use of this medicine in children. While this drug may be prescribed for children as young as 13 years for selected conditions, precautions do apply. Overdosage: If you think you have taken too much of this medicine contact a poison control center or emergency room at once. NOTE: This medicine is only for you. Do not share this medicine with others. What if I miss a dose? It is important not to miss your dose. Call your doctor or health care professional if you are  unable to keep an appointment. What may interact with this medicine? Do not take this medicine with any of the following medications: -other medicines containing denosumab This medicine may also interact with the following medications: -medicines that suppress the immune system -medicines that treat cancer -steroid medicines like prednisone or cortisone This list may not describe all possible interactions. Give your health care provider a list of all the medicines, herbs, non-prescription drugs, or dietary supplements you use. Also tell them if you smoke, drink alcohol, or use illegal drugs. Some items may interact with your medicine. What should I watch for while using this medicine? Visit your doctor or health care professional for regular checks on your progress. Your doctor or health care professional may order blood tests and other tests to see how you are doing. Call your doctor or health care professional if you get a cold or other infection while receiving this medicine. Do not treat yourself. This medicine may decrease your body's ability to fight infection. You should make sure you get enough calcium and vitamin D while you are taking this medicine, unless your doctor tells you not to. Discuss the foods you eat and the vitamins you take with your health care professional. See your dentist regularly. Brush and floss your teeth as directed. Before you have any dental work done, tell your dentist you are receiving this medicine. Do not become pregnant while taking this medicine or for 5 months after stopping it. Women should inform their doctor if they wish to become pregnant or think they might be pregnant. There is a potential for serious side effects   to an unborn child. Talk to your health care professional or pharmacist for more information. What side effects may I notice from receiving this medicine? Side effects that you should report to your doctor or health care professional as soon as  possible: -allergic reactions like skin rash, itching or hives, swelling of the face, lips, or tongue -breathing problems -chest pain -fast, irregular heartbeat -feeling faint or lightheaded, falls -fever, chills, or any other sign of infection -muscle spasms, tightening, or twitches -numbness or tingling -skin blisters or bumps, or is dry, peels, or red -slow healing or unexplained pain in the mouth or jaw -unusual bleeding or bruising Side effects that usually do not require medical attention (Report these to your doctor or health care professional if they continue or are bothersome.): -muscle pain -stomach upset, gas This list may not describe all possible side effects. Call your doctor for medical advice about side effects. You may report side effects to FDA at 1-800-FDA-1088. Where should I keep my medicine? This medicine is only given in a clinic, doctor's office, or other health care setting and will not be stored at home. NOTE: This sheet is a summary. It may not cover all possible information. If you have questions about this medicine, talk to your doctor, pharmacist, or health care provider.    2016, Elsevier/Gold Standard. (2011-11-20 12:37:47) Leuprolide depot injection What is this medicine? LEUPROLIDE (loo PROE lide) is a man-made protein that acts like a natural hormone in the body. It decreases testosterone in men and decreases estrogen in women. In men, this medicine is used to treat advanced prostate cancer. In women, some forms of this medicine may be used to treat endometriosis, uterine fibroids, or other male hormone-related problems. This medicine may be used for other purposes; ask your health care provider or pharmacist if you have questions. What should I tell my health care provider before I take this medicine? They need to know if you have any of these conditions: -diabetes -heart disease or previous heart attack -high blood pressure -high  cholesterol -osteoporosis -pain or difficulty passing urine -spinal cord metastasis -stroke -tobacco smoker -unusual vaginal bleeding (women) -an unusual or allergic reaction to leuprolide, benzyl alcohol, other medicines, foods, dyes, or preservatives -pregnant or trying to get pregnant -breast-feeding How should I use this medicine? This medicine is for injection into a muscle or for injection under the skin. It is given by a health care professional in a hospital or clinic setting. The specific product will determine how it will be given to you. Make sure you understand which product you receive and how often you will receive it. Talk to your pediatrician regarding the use of this medicine in children. Special care may be needed. Overdosage: If you think you have taken too much of this medicine contact a poison control center or emergency room at once. NOTE: This medicine is only for you. Do not share this medicine with others. What if I miss a dose? It is important not to miss a dose. Call your doctor or health care professional if you are unable to keep an appointment. Depot injections: Depot injections are given either once-monthly, every 12 weeks, every 16 weeks, or every 24 weeks depending on the product you are prescribed. The product you are prescribed will be based on if you are male or male, and your condition. Make sure you understand your product and dosing. What may interact with this medicine? Do not take this medicine with any of the following  medications: -chasteberry This medicine may also interact with the following medications: -herbal or dietary supplements, like black cohosh or DHEA -male hormones, like estrogens or progestins and birth control pills, patches, rings, or injections -male hormones, like testosterone This list may not describe all possible interactions. Give your health care provider a list of all the medicines, herbs, non-prescription drugs, or  dietary supplements you use. Also tell them if you smoke, drink alcohol, or use illegal drugs. Some items may interact with your medicine. What should I watch for while using this medicine? Visit your doctor or health care professional for regular checks on your progress. During the first weeks of treatment, your symptoms may get worse, but then will improve as you continue your treatment. You may get hot flashes, increased bone pain, increased difficulty passing urine, or an aggravation of nerve symptoms. Discuss these effects with your doctor or health care professional, some of them may improve with continued use of this medicine. Male patients may experience a menstrual cycle or spotting during the first months of therapy with this medicine. If this continues, contact your doctor or health care professional. What side effects may I notice from receiving this medicine? Side effects that you should report to your doctor or health care professional as soon as possible: -allergic reactions like skin rash, itching or hives, swelling of the face, lips, or tongue -breathing problems -chest pain -depression or memory disorders -pain in your legs or groin -pain at site where injected or implanted -severe headache -swelling of the feet and legs -visual changes -vomiting Side effects that usually do not require medical attention (report to your doctor or health care professional if they continue or are bothersome): -breast swelling or tenderness -decrease in sex drive or performance -diarrhea -hot flashes -loss of appetite -muscle, joint, or bone pains -nausea -redness or irritation at site where injected or implanted -skin problems or acne This list may not describe all possible side effects. Call your doctor for medical advice about side effects. You may report side effects to FDA at 1-800-FDA-1088. Where should I keep my medicine? This drug is given in a hospital or clinic and will not be  stored at home. NOTE: This sheet is a summary. It may not cover all possible information. If you have questions about this medicine, talk to your doctor, pharmacist, or health care provider.    2016, Elsevier/Gold Standard. (2014-02-13 14:16:23)

## 2016-03-29 NOTE — Progress Notes (Signed)
Hematology and Oncology Follow Up Visit  Lucas Holloway [id number] 19-Nov-1940 75 y.o. 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 He was initially diagnosed in 2009 without Gleason score 6, stage TIc and a PSA of around 9.   Prior Therapy: He underwent seed implant brachytherapy with excellent PSA response initially. His PSA nadir was 0.7 and 2013.  His PSA did go up to 1.2 in October 2016 however. His staging workup at that time including a bone scan which showed metastatic bony disease.  He was started on androgen deprivation under the care of Dr. [name] and his PSA did drop down initially to 0.61 in May 2017.  On 12/02/2015 his PSA was 1.44 with a testosterone level of 32. A repeat a bone scan obtained on 12/13/2015 showed progression of disease with increased uptake in the anterior and posterior ribs, sternum and right humerus  Current therapy:  Zytiga 1000 mg daily with prednisone 5 mg daily started on 01/07/2016. He is receiving Xgeva on a monthly basis.  He is currently on Lupron every 3 months last injection in July 2017. This will be repeated on 03/29/2016.  Interim History: Mr. Salk presents today for a follow-up visit. Since the last visit, he reports feeling well without any recent complaints  He denied any pathological fractures, back pain or neuropathy. His quality of life, performance status remains excellent. He continues to tolerate Zytiga without major complications. He denied lower extremity edema, nausea, pruritus or other GI toxicities. His appetite is excellent and he continues to attend activities of daily living.   He does not report any headaches, blurry vision, syncope or seizures. He does not report any fevers or chills or sweats. He does not report any cough, wheezing or hemoptysis. He does not report any nausea, vomiting or  abdominal pain. He does not report any frequency urgency or hesitancy. He does not report any skeletal complaints of arthralgias myalgias. He does not report any lymphadenopathy or petechiae. Remaining review of systems unremarkable.   Medications: I have reviewed the patient's current medications.  Current Outpatient Prescriptions  Medication Sig Dispense Refill  . Abiraterone Acetate (ZYTIGA) 500 MG TABS Take 1,000 mg by mouth daily.    . calcium-vitamin D (OSCAL WITH D) 500-200 MG-UNIT per tablet Take 1 tablet by mouth 2 (two) times daily.    . cholecalciferol (VITAMIN D) 1000 units tablet Take 1,000 Units by mouth daily. Patient takes 6 tablets daily    . Degarelix Acetate (FIRMAGON [place]) One injection a month    . Denosumab (XGEVA [place]) One injection a month    . fish oil-omega-3 fatty acids 1000 MG capsule Take 1 g by mouth daily.     . folic acid (FOLVITE) 1 MG tablet Take 1 mg by mouth daily.    Marland Kitchen levothyroxine (SYNTHROID, LEVOTHROID) 75 MCG tablet Take 75 mcg by mouth daily before breakfast.   3  . meloxicam (MOBIC) 7.5 MG tablet Take 7.5 mg by mouth daily.    . methotrexate (RHEUMATREX) 2.5 MG tablet Take 15 mg by mouth once a week. Takes on sundays.Marland KitchenMarland KitchenCaution:Chemotherapy. Protect from light.    . Multiple Vitamin (MULTIVITAMIN WITH MINERALS) TABS Take 1 tablet by mouth daily.    Marland Kitchen omeprazole (PRILOSEC) 40 MG capsule Take 40 mg by mouth daily.  3  . predniSONE (DELTASONE) 5 MG tablet Take 1 tablet (5 mg total) by mouth daily with breakfast. 30  tablet 3  . simvastatin (ZOCOR) 20 MG tablet Take 1 tablet by mouth daily at 2 PM.  3  . vitamin E 400 UNIT capsule Take 800 Units by mouth daily.      No current facility-administered medications for this visit.      Allergies: No Known Allergies  Past Medical History, Surgical history, Social history, and Family History were reviewed and updated.  Physical Exam: Blood pressure 133/66, pulse 78, temperature 97.5 F (36.4 C), temperature  source Oral, resp. rate 18, height 5\' 8"  (1.727 m), weight 165 lb 14.4 oz (75.3 kg), SpO2 97 %. ECOG: 0 General appearance: Well-appearing gentleman without distress. Head: Normocephalic, without obvious abnormality no oral rash noted. Neck: no adenopathy Lymph nodes: Cervical, supraclavicular, and axillary nodes normal. Heart:regular rate and rhythm, S1, S2 normal, no murmur, click, rub or gallop Lung:chest clear, no wheezing, rales, normal symmetric air entry Abdomin: soft, non-tender, without masses or organomegaly no shifting dullness or ascites. EXT:no edema noted.   Lab Results: Lab Results  Component Value Date   WBC 6.0 03/29/2016   HGB 15.7 03/29/2016   HCT 46.1 03/29/2016   MCV 95.6 03/29/2016   PLT 205 03/29/2016     Chemistry      Component Value Date/Time   NA 138 02/28/2016 0755   K 4.4 02/28/2016 0755   CO2 25 02/28/2016 0755   BUN 16.8 02/28/2016 0755   CREATININE 0.9 02/28/2016 0755      Component Value Date/Time   CALCIUM 9.0 02/28/2016 0755   ALKPHOS 100 02/28/2016 0755   AST 42 (H) 02/28/2016 0755   ALT 40 02/28/2016 0755   BILITOT 0.56 02/28/2016 0755      Results for VASSAR, FOGELSON (MRN [id number]) as of 03/29/2016 09:32  Ref. Range 02/04/2016 15:05 02/28/2016 07:55  PSA Latest Ref Range: 0.0 - 4.0 ng/mL 2.3 1.6    Impression and Plan:  75 year old gentleman with the following issues:  1. Prostate cancer initially diagnosed in 2009 without Gleason score 6, stage TIc and a PSA of around 9. He was treated with brachytherapy utilizing seed implants and developed recurrent disease in October 2016. He developed a bony metastasis and treated with androgen deprivation.    He developed castration resistant disease in June 2017 with progressive bony metastasis.   He started Zytiga on 01/07/2016 and tolerated it well. His PSA continues to decline down to 1.6. He had an excellent clinical response with improvement in his arthralgias and myalgias. The plan is  to continue with the same dose and schedule without any dose reduction or delay.  2. Bone directed therapy: He is currently receiving Xgeva on a monthly basis. He is on calcium and vitamin D supplements. He already obtained dental clearance prior to Xgeva start in 2016. Risks and benefits of continuing this medication were reviewed and he is agreeable to receive it. He will continue to receive this on a monthly basis.  3. Androgen depravation: I recommended to continue therapy indefinitely. He will receive Lupron on 03/29/2016 and repeated after 3 months. Because of potential trip to [place], this can be resumed in March 2017 rather than February.  4. Liver function test and electrolytes surveillance: These will be obtained with every visit to ensure stability.  5. Follow-up: Will be in 4 weeks to follow his clinical status.    [facility], Lucas Holloway 10/25/20179:42 AM

## 2016-03-29 NOTE — Progress Notes (Signed)
Oral Chemotherapy Pharmacist Encounter  Patient given Zytiga samples Medication: Zytiga (abiraterone acetate) 250mg  tablets Instructions: Take 4 tablets (1000mg  total) by mouth once daily on an empty stomach. One hour before or 2 hours after a meal. Dispense Quantity: 120 tablets Days supply: 30 Manufacturer: [employer] Exp: 01/2017  Johny Drilling, PharmD, BCPS 03/29/2016  10:16 AM Oral Chemotherapy Clinic [phone]

## 2016-03-29 NOTE — Telephone Encounter (Signed)
Appointments scheduled per 10/25 LOS. The patient was given AVS report and calendar of future scheduled appointments.

## 2016-03-30 ENCOUNTER — Telehealth: Payer: Self-pay | Admitting: *Deleted

## 2016-03-30 LAB — PSA: PROSTATE SPECIFIC AG, SERUM: 1.4 ng/mL (ref 0.0–4.0)

## 2016-03-30 NOTE — Telephone Encounter (Signed)
-----   Message from Wyatt Portela, MD sent at 03/30/2016  8:44 AM EDT ----- Please let him know PSA still low.

## 2016-03-30 NOTE — Telephone Encounter (Signed)
As noted below by Dr. Shadad, I informed patient of his PSA level. Patient verbalized understanding. 

## 2016-04-03 DIAGNOSIS — E559 Vitamin D deficiency, unspecified: Secondary | ICD-10-CM | POA: Diagnosis not present

## 2016-04-03 DIAGNOSIS — Z136 Encounter for screening for cardiovascular disorders: Secondary | ICD-10-CM | POA: Diagnosis not present

## 2016-04-03 DIAGNOSIS — E039 Hypothyroidism, unspecified: Secondary | ICD-10-CM | POA: Diagnosis not present

## 2016-04-05 DIAGNOSIS — K219 Gastro-esophageal reflux disease without esophagitis: Secondary | ICD-10-CM | POA: Diagnosis not present

## 2016-04-05 DIAGNOSIS — E559 Vitamin D deficiency, unspecified: Secondary | ICD-10-CM | POA: Diagnosis not present

## 2016-04-05 DIAGNOSIS — E039 Hypothyroidism, unspecified: Secondary | ICD-10-CM | POA: Diagnosis not present

## 2016-04-05 DIAGNOSIS — E782 Mixed hyperlipidemia: Secondary | ICD-10-CM | POA: Diagnosis not present

## 2016-05-01 DIAGNOSIS — L4059 Other psoriatic arthropathy: Secondary | ICD-10-CM | POA: Diagnosis not present

## 2016-05-02 ENCOUNTER — Telehealth: Payer: Self-pay | Admitting: Oncology

## 2016-05-02 ENCOUNTER — Ambulatory Visit (HOSPITAL_BASED_OUTPATIENT_CLINIC_OR_DEPARTMENT_OTHER): Payer: Medicare Other | Admitting: Oncology

## 2016-05-02 ENCOUNTER — Other Ambulatory Visit (HOSPITAL_BASED_OUTPATIENT_CLINIC_OR_DEPARTMENT_OTHER): Payer: Medicare Other

## 2016-05-02 ENCOUNTER — Ambulatory Visit (HOSPITAL_BASED_OUTPATIENT_CLINIC_OR_DEPARTMENT_OTHER): Payer: Medicare Other

## 2016-05-02 VITALS — BP 144/81 | HR 72 | Temp 98.1°F | Resp 16

## 2016-05-02 VITALS — BP 128/72 | HR 74 | Temp 97.6°F | Resp 16 | Ht 68.0 in | Wt 167.6 lb

## 2016-05-02 DIAGNOSIS — C61 Malignant neoplasm of prostate: Secondary | ICD-10-CM

## 2016-05-02 DIAGNOSIS — C7951 Secondary malignant neoplasm of bone: Secondary | ICD-10-CM

## 2016-05-02 DIAGNOSIS — E291 Testicular hypofunction: Secondary | ICD-10-CM | POA: Diagnosis not present

## 2016-05-02 LAB — CBC WITH DIFFERENTIAL/PLATELET
BASO%: 0.3 % (ref 0.0–2.0)
BASOS ABS: 0 10*3/uL (ref 0.0–0.1)
EOS%: 0.7 % (ref 0.0–7.0)
Eosinophils Absolute: 0.1 10*3/uL (ref 0.0–0.5)
HCT: 41.4 % (ref 38.4–49.9)
HEMOGLOBIN: 14.3 g/dL (ref 13.0–17.1)
LYMPH#: 0.8 10*3/uL — AB (ref 0.9–3.3)
LYMPH%: 12.1 % — ABNORMAL LOW (ref 14.0–49.0)
MCH: 32.9 pg (ref 27.2–33.4)
MCHC: 34.5 g/dL (ref 32.0–36.0)
MCV: 95.2 fL (ref 79.3–98.0)
MONO#: 0.4 10*3/uL (ref 0.1–0.9)
MONO%: 5.3 % (ref 0.0–14.0)
NEUT#: 5.5 10*3/uL (ref 1.5–6.5)
NEUT%: 81.6 % — ABNORMAL HIGH (ref 39.0–75.0)
Platelets: 170 10*3/uL (ref 140–400)
RBC: 4.35 10*6/uL (ref 4.20–5.82)
RDW: 14.5 % (ref 11.0–14.6)
WBC: 6.8 10*3/uL (ref 4.0–10.3)

## 2016-05-02 LAB — COMPREHENSIVE METABOLIC PANEL
ALBUMIN: 3.5 g/dL (ref 3.5–5.0)
ALT: 46 U/L (ref 0–55)
AST: 44 U/L — AB (ref 5–34)
Alkaline Phosphatase: 72 U/L (ref 40–150)
Anion Gap: 8 mEq/L (ref 3–11)
BUN: 21.6 mg/dL (ref 7.0–26.0)
CHLORIDE: 101 meq/L (ref 98–109)
CO2: 25 mEq/L (ref 22–29)
CREATININE: 0.9 mg/dL (ref 0.7–1.3)
Calcium: 9.5 mg/dL (ref 8.4–10.4)
EGFR: 85 mL/min/{1.73_m2} — ABNORMAL LOW (ref >90)
GLUCOSE: 124 mg/dL (ref 70–140)
POTASSIUM: 5 meq/L (ref 3.5–5.1)
SODIUM: 134 meq/L — AB (ref 136–145)
Total Bilirubin: 0.5 mg/dL (ref 0.20–1.20)
Total Protein: 6.5 g/dL (ref 6.4–8.3)

## 2016-05-02 MED ORDER — DENOSUMAB 120 MG/1.7ML ~~LOC~~ SOLN
120.0000 mg | Freq: Once | SUBCUTANEOUS | Status: AC
  Administered 2016-05-02: 120 mg via SUBCUTANEOUS
  Filled 2016-05-02: qty 1.7

## 2016-05-02 NOTE — Progress Notes (Signed)
Hematology and Oncology Follow Up Visit  Lucas Holloway [id number] 02/21/1941 75 y.o. 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 He was initially diagnosed in 2009 without Gleason score 6, stage TIc and a PSA of around 9.   Prior Therapy: He underwent seed implant brachytherapy with excellent PSA response initially. His PSA nadir was 0.7 and 2013.  His PSA did go up to 1.2 in October 2016 however. His staging workup at that time including a bone scan which showed metastatic bony disease.  He was started on androgen deprivation under the care of Dr. Jeffie Holloway and his PSA did drop down initially to 0.61 in May 2017.  On 12/02/2015 his PSA was 1.44 with a testosterone level of 32. A repeat a bone scan obtained on 12/13/2015 showed progression of disease with increased uptake in the anterior and posterior ribs, sternum and right humerus  Current therapy:  Zytiga 1000 mg daily with prednisone 5 mg daily started on 01/07/2016. He is receiving Xgeva on a monthly basis.  He is currently on Lupron every 3 months last injection in July 2017. This will be repeated every 3 months.  Interim History: Lucas Holloway presents today for a follow-up visit. Since the last visit, he reports no changes in his health. He continues to enjoy excellent quality of life without any worsening bone pain. He denied any pathological fractures, back pain or neuropathy. He continues to tolerate Zytiga without major complications. He denied lower extremity edema, nausea, pruritus or other GI toxicities. His appetite is excellent and he continues to attend activities of daily living. He is planning to travel to [place] in the next few days and spend the winter over there. He denied any complications related to Lupron or Xgeva.  He does not report any headaches, blurry vision, syncope or seizures. He  does not report any fevers or chills or sweats. He does not report any cough, wheezing or hemoptysis. He does not report any nausea, vomiting or abdominal pain. He does not report any frequency urgency or hesitancy. He does not report any skeletal complaints of arthralgias myalgias. He does not report any lymphadenopathy or petechiae. Remaining review of systems unremarkable.   Medications: I have reviewed the patient's current medications.  Current Outpatient Prescriptions  Medication Sig Dispense Refill  . Abiraterone Acetate (ZYTIGA) 500 MG TABS Take 1,000 mg by mouth daily.    . calcium-vitamin D (OSCAL WITH D) 500-200 MG-UNIT per tablet Take 1 tablet by mouth 2 (two) times daily.    . cholecalciferol (VITAMIN D) 1000 units tablet Take 1,000 Units by mouth daily. Patient takes 6 tablets daily    . Degarelix Acetate (FIRMAGON [place]) One injection a month    . Denosumab (XGEVA [place]) One injection a month    . fish oil-omega-3 fatty acids 1000 MG capsule Take 1 g by mouth daily.     . folic acid (FOLVITE) 1 MG tablet Take 1 mg by mouth daily.    Marland Kitchen levothyroxine (SYNTHROID, LEVOTHROID) 75 MCG tablet Take 75 mcg by mouth daily before breakfast.   3  . meloxicam (MOBIC) 7.5 MG tablet Take 7.5 mg by mouth daily.    . methotrexate (RHEUMATREX) 2.5 MG tablet Take 15 mg by mouth once a week. Takes on sundays.Marland KitchenMarland KitchenCaution:Chemotherapy. Protect from light.    . Multiple Vitamin (MULTIVITAMIN WITH MINERALS) TABS Take 1 tablet by mouth daily.    . predniSONE (DELTASONE)  5 MG tablet Take 1 tablet (5 mg total) by mouth daily with breakfast. 30 tablet 3  . simvastatin (ZOCOR) 20 MG tablet Take 1 tablet by mouth daily at 2 PM.  3  . vitamin E 400 UNIT capsule Take 800 Units by mouth daily.      No current facility-administered medications for this visit.    Facility-Administered Medications Ordered in Other Visits  Medication Dose Route Frequency Provider Last Rate Last Dose  . denosumab (XGEVA) injection 120 mg   120 mg Subcutaneous Once Lucas Portela, MD         Allergies: No Known Allergies  Past Medical History, Surgical history, Social history, and Family History were reviewed and updated.  Physical Exam: Blood pressure 128/72, pulse 74, temperature 97.6 F (36.4 C), temperature source Oral, resp. rate 16, height 5\' 8"  (1.727 m), weight 167 lb 9.6 oz (76 kg), SpO2 97 %. ECOG: 0 General appearance: Alert, awake gentleman without distress. Head: Normocephalic, without obvious abnormality no oral thrush noted. Neck: no adenopathy Lymph nodes: Cervical, supraclavicular, and axillary nodes normal. Heart:regular rate and rhythm, S1, S2 normal, no murmur, click, rub or gallop Lung:chest clear, no wheezing, rales, normal symmetric air entry Abdomin: soft, non-tender, without masses or organomegaly no rebound or guarding. EXT:no edema noted.   Lab Results: Lab Results  Component Value Date   WBC 6.8 05/02/2016   HGB 14.3 05/02/2016   HCT 41.4 05/02/2016   MCV 95.2 05/02/2016   PLT 170 05/02/2016     Chemistry      Component Value Date/Time   NA 134 (L) 05/02/2016 1443   K 5.0 05/02/2016 1443   CO2 25 05/02/2016 1443   BUN 21.6 05/02/2016 1443   CREATININE 0.9 05/02/2016 1443      Component Value Date/Time   CALCIUM 9.5 05/02/2016 1443   ALKPHOS 72 05/02/2016 1443   AST 44 (H) 05/02/2016 1443   ALT 46 05/02/2016 1443   BILITOT 0.50 05/02/2016 1443     Results for Lucas Holloway (MRN [id number]) as of 05/02/2016 15:54  Ref. Range 02/04/2016 15:05 02/28/2016 07:55 03/29/2016 09:09  PSA Latest Ref Range: 0.0 - 4.0 ng/mL 2.3 1.6 1.4      Impression and Plan:  75 year old gentleman with the following issues:  1. Prostate cancer initially diagnosed in 2009 without Gleason score 6, stage TIc and a PSA of around 9. He was treated with brachytherapy utilizing seed implants and developed recurrent disease in October 2016. He developed a bony metastasis and treated with androgen  deprivation.    He developed castration resistant disease in June 2017 with progressive bony metastasis.  He started Zytiga on 01/07/2016 and tolerated it well. His PSA continues to decline down to 1.4.   The plan is to continue the same dose and schedule long as he is responding. He has 1 refill on this medication I will take last him until January 2018. He is willing to make one copay he did have an extra month supply during his travel to [place].  2. Bone directed therapy: He is currently receiving Xgeva on a monthly basis. He is on calcium and vitamin D supplements. He already obtained dental clearance prior to Xgeva start in 2016. Risks and benefits of continuing this medication were reviewed and he is agreeable to receive it. He will receive his injection today and will be repeated in 3 months after his return from [place].  3. Androgen depravation: I recommended to continue therapy indefinitely. He will receive  Lupron on 05/02/2016 and will be repeated in February 2018.  4. Liver function test and electrolytes surveillance: These will be obtained with every visit to ensure stability.  5. Follow-up: Will be on 08/02/2016.     Zola Button, MD 11/28/20173:52 PM

## 2016-05-02 NOTE — Telephone Encounter (Signed)
Appointments scheduled per 05/02/16 los. A copy of the AVS  Report and appointment schedule was given to patient, per 05/02/16 los. °

## 2016-05-03 ENCOUNTER — Other Ambulatory Visit: Payer: Self-pay | Admitting: *Deleted

## 2016-05-03 ENCOUNTER — Telehealth: Payer: Self-pay | Admitting: *Deleted

## 2016-05-03 DIAGNOSIS — C61 Malignant neoplasm of prostate: Secondary | ICD-10-CM

## 2016-05-03 LAB — PSA: Prostate Specific Ag, Serum: 0.9 ng/mL (ref 0.0–4.0)

## 2016-05-03 MED ORDER — PREDNISONE 5 MG PO TABS: 5.0000 mg | ORAL_TABLET | Freq: Every day | ORAL | 0 refills | Status: DC

## 2016-05-03 NOTE — Telephone Encounter (Signed)
As noted below by Dr. Shadad, I informed the patient of his PSA level. Patient verbalized understanding. 

## 2016-05-03 NOTE — Telephone Encounter (Signed)
-----   Message from Wyatt Portela, MD sent at 05/03/2016  8:39 AM EST -----   ----- Message ----- From: Wyatt Portela, MD Sent: 05/03/2016   8:22 AM To: Randolm Idol, RN  Please let him know his PSA is down.

## 2016-05-09 ENCOUNTER — Telehealth: Payer: Self-pay | Admitting: Pharmacist

## 2016-05-09 NOTE — Telephone Encounter (Signed)
Oral Chemotherapy Pharmacist Encounter  Received voicemail from patient with questions about medications. I called patient back at provided phone number of [phone]. My call was not able to be completed at this time. I called again and again received same error message.  Oral Oncology Clinic will attempt to reach patient again tomorrow 05/10/16.  Johny Drilling, PharmD, BCPS, BCOP 05/09/2016  1:52 PM Oral Oncology Clinic [phone]

## 2016-05-10 ENCOUNTER — Encounter: Payer: Self-pay | Admitting: *Deleted

## 2016-05-10 ENCOUNTER — Telehealth: Payer: Self-pay | Admitting: *Deleted

## 2016-05-10 NOTE — Telephone Encounter (Signed)
Attempted to call patient, not a working #. No longer at work #. Left message on wife's phone for patient to call me.

## 2016-05-10 NOTE — Telephone Encounter (Signed)
Patient calling to ask if it would be okay to take only 3 - 250 mg sample tablets of Zytiga per day in order to save some to  take in January?

## 2016-05-10 NOTE — Telephone Encounter (Signed)
It's not advisable to do so. It might affect his cancer care.

## 2016-05-11 NOTE — Telephone Encounter (Signed)
Spoke with patient, he is to continue on same dose of zytiga, no alterations, per dr [name].

## 2016-06-06 ENCOUNTER — Telehealth: Payer: Self-pay | Admitting: Pharmacist

## 2016-06-06 ENCOUNTER — Other Ambulatory Visit: Payer: Self-pay | Admitting: *Deleted

## 2016-06-06 DIAGNOSIS — C61 Malignant neoplasm of prostate: Secondary | ICD-10-CM

## 2016-06-06 MED ORDER — ABIRATERONE ACETATE 500 MG PO TABS: 1000.0000 mg | ORAL_TABLET | Freq: Every day | ORAL | 0 refills | Status: DC

## 2016-06-06 NOTE — Telephone Encounter (Signed)
Oral Chemotherapy Pharmacist Encounter  New prescription for Zytiga obtained and faxed to [facility] at [phone]. I also sent demographic info, insurance info, medication list, and last MD office note.  Patient is expecting to have a high copay [id number]) for January 2018. He is aware that his copay will decrease for February through December of 2018. Patient has agreed to pay copayment. He currently does not qualify for financial assistance through foundations or the manufacturer. Patient is aware of this and will let the office know if he has a change in his financial status.  Oral Oncology Clinic will continue to follow.  Johny Drilling, PharmD, BCPS, BCOP 06/06/2016  1:25 PM Oral Oncology Clinic [phone]

## 2016-06-07 ENCOUNTER — Telehealth: Payer: Self-pay | Admitting: Pharmacist

## 2016-06-07 NOTE — Telephone Encounter (Signed)
I received a call from [facility] and pts Copay for Zytiga = $2933.00 I called pt to inform him.  He agrees to pay this month and expects copay to decrease next month. I returned call to [facility] to have them move forward w/ filling the Rx and asked them to call pt to arrange delivery.  Kennith Center, Pharm.D., CPP 06/07/2016@1 :[facility]

## 2016-06-29 ENCOUNTER — Other Ambulatory Visit: Payer: Self-pay | Admitting: *Deleted

## 2016-06-29 DIAGNOSIS — C61 Malignant neoplasm of prostate: Secondary | ICD-10-CM

## 2016-06-29 MED ORDER — ABIRATERONE ACETATE 500 MG PO TABS: 1000.0000 mg | ORAL_TABLET | Freq: Every day | ORAL | 0 refills | Status: DC

## 2016-07-20 ENCOUNTER — Telehealth: Payer: Self-pay | Admitting: Oncology

## 2016-07-20 NOTE — Telephone Encounter (Signed)
Spoke with patient and confirmed change in appointment

## 2016-07-27 ENCOUNTER — Other Ambulatory Visit: Payer: Self-pay | Admitting: *Deleted

## 2016-07-27 DIAGNOSIS — C61 Malignant neoplasm of prostate: Secondary | ICD-10-CM

## 2016-07-27 MED ORDER — ABIRATERONE ACETATE 500 MG PO TABS: 1000.0000 mg | ORAL_TABLET | Freq: Every day | ORAL | 0 refills | Status: DC

## 2016-08-02 ENCOUNTER — Ambulatory Visit: Payer: Medicare Other

## 2016-08-02 ENCOUNTER — Ambulatory Visit: Payer: Medicare Other | Admitting: Oncology

## 2016-08-02 ENCOUNTER — Other Ambulatory Visit: Payer: Medicare Other

## 2016-08-04 ENCOUNTER — Telehealth: Payer: Self-pay | Admitting: Oncology

## 2016-08-04 ENCOUNTER — Ambulatory Visit (HOSPITAL_BASED_OUTPATIENT_CLINIC_OR_DEPARTMENT_OTHER): Payer: Medicare Other | Admitting: Oncology

## 2016-08-04 ENCOUNTER — Other Ambulatory Visit (HOSPITAL_BASED_OUTPATIENT_CLINIC_OR_DEPARTMENT_OTHER): Payer: Medicare Other

## 2016-08-04 ENCOUNTER — Ambulatory Visit (HOSPITAL_BASED_OUTPATIENT_CLINIC_OR_DEPARTMENT_OTHER): Payer: Medicare Other

## 2016-08-04 DIAGNOSIS — C61 Malignant neoplasm of prostate: Secondary | ICD-10-CM

## 2016-08-04 DIAGNOSIS — Z5111 Encounter for antineoplastic chemotherapy: Secondary | ICD-10-CM

## 2016-08-04 DIAGNOSIS — C7951 Secondary malignant neoplasm of bone: Secondary | ICD-10-CM | POA: Diagnosis not present

## 2016-08-04 DIAGNOSIS — E291 Testicular hypofunction: Secondary | ICD-10-CM

## 2016-08-04 LAB — COMPREHENSIVE METABOLIC PANEL
ALT: 43 U/L (ref 0–55)
AST: 42 U/L — AB (ref 5–34)
Albumin: 3.7 g/dL (ref 3.5–5.0)
Alkaline Phosphatase: 82 U/L (ref 40–150)
Anion Gap: 9 mEq/L (ref 3–11)
BUN: 18.6 mg/dL (ref 7.0–26.0)
CHLORIDE: 105 meq/L (ref 98–109)
CO2: 22 meq/L (ref 22–29)
CREATININE: 0.8 mg/dL (ref 0.7–1.3)
Calcium: 9.2 mg/dL (ref 8.4–10.4)
EGFR: 86 mL/min/{1.73_m2} — ABNORMAL LOW (ref >90)
Glucose: 120 mg/dl (ref 70–140)
POTASSIUM: 4.8 meq/L (ref 3.5–5.1)
SODIUM: 136 meq/L (ref 136–145)
Total Bilirubin: 0.44 mg/dL (ref 0.20–1.20)
Total Protein: 6.6 g/dL (ref 6.4–8.3)

## 2016-08-04 LAB — CBC WITH DIFFERENTIAL/PLATELET
BASO%: 0.7 % (ref 0.0–2.0)
BASOS ABS: 0.1 10*3/uL (ref 0.0–0.1)
EOS%: 0.8 % (ref 0.0–7.0)
Eosinophils Absolute: 0.1 10*3/uL (ref 0.0–0.5)
HCT: 42.3 % (ref 38.4–49.9)
HGB: 14.4 g/dL (ref 13.0–17.1)
LYMPH#: 0.9 10*3/uL (ref 0.9–3.3)
LYMPH%: 12 % — AB (ref 14.0–49.0)
MCH: 33.3 pg (ref 27.2–33.4)
MCHC: 34 g/dL (ref 32.0–36.0)
MCV: 97.9 fL (ref 79.3–98.0)
MONO#: 0.6 10*3/uL (ref 0.1–0.9)
MONO%: 7.4 % (ref 0.0–14.0)
NEUT#: 6 10*3/uL (ref 1.5–6.5)
NEUT%: 79.1 % — AB (ref 39.0–75.0)
Platelets: 175 10*3/uL (ref 140–400)
RBC: 4.32 10*6/uL (ref 4.20–5.82)
RDW: 14.4 % (ref 11.0–14.6)
WBC: 7.6 10*3/uL (ref 4.0–10.3)

## 2016-08-04 MED ORDER — LEUPROLIDE ACETATE (3 MONTH) 22.5 MG IM KIT
22.5000 mg | PACK | Freq: Once | INTRAMUSCULAR | Status: AC
  Administered 2016-08-04: 22.5 mg via INTRAMUSCULAR
  Filled 2016-08-04: qty 22.5

## 2016-08-04 MED ORDER — DENOSUMAB 120 MG/1.7ML ~~LOC~~ SOLN
120.0000 mg | Freq: Once | SUBCUTANEOUS | Status: AC
  Administered 2016-08-04: 120 mg via SUBCUTANEOUS
  Filled 2016-08-04: qty 1.7

## 2016-08-04 MED ORDER — PREDNISONE 5 MG PO TABS: 5.0000 mg | ORAL_TABLET | Freq: Every day | ORAL | 0 refills | Status: DC

## 2016-08-04 NOTE — Telephone Encounter (Signed)
Appointments scheduled per 3/2 LOS. Patient given AVS report and calendars with future scheduled appointments. °

## 2016-08-04 NOTE — Progress Notes (Signed)
Hematology and Oncology Follow Up Visit  Lucas Holloway [id number] Oct 11, 1940 76 y.o. 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 He was initially diagnosed in 2009 without Gleason score 6, stage TIc and a PSA of around 9.   Prior Therapy: He underwent seed implant brachytherapy with excellent PSA response initially. His PSA nadir was 0.7 and 2013.  His PSA did go up to 1.2 in October 2016 however. His staging workup at that time including a bone scan which showed metastatic bony disease.  He was started on androgen deprivation under the care of Dr. [name] and his PSA did drop down initially to 0.61 in May 2017.  On 12/02/2015 his PSA was 1.44 with a testosterone level of 32. A repeat a bone scan obtained on 12/13/2015 showed progression of disease with increased uptake in the anterior and posterior ribs, sternum and right humerus  Current therapy:  Zytiga 1000 mg daily with prednisone 5 mg daily started on 01/07/2016. He is receiving Xgeva every 6 weeks. He is currently on Lupron every 3 months.   Interim History: [name] presents today for a follow-up visit. Since the last visit, he went the last 3 months in [place] and does not report any issues. He continues to tolerate Zytiga during his stay there without major complications. He did not miss any doses and was able to have the medication delivered tear on time. He denied lower extremity edema, nausea, pruritus or other GI toxicities. His appetite is excellent and have gained a few pounds since the last visit. He continues to attend activities of daily living. He had any pathological fractures or bone pain.  He does not report any headaches, blurry vision, syncope or seizures. He does not report any fevers or chills or sweats. He does not report any cough, wheezing or hemoptysis. He does not report any  nausea, vomiting or abdominal pain. He does not report any frequency urgency or hesitancy. He does not report any skeletal complaints of arthralgias myalgias. He does not report any lymphadenopathy or petechiae. Remaining review of systems unremarkable.   Medications: I have reviewed the patient's current medications.  Current Outpatient Prescriptions  Medication Sig Dispense Refill  . Abiraterone Acetate (ZYTIGA) 500 MG TABS Take 1,000 mg by mouth daily. 60 tablet 0  . calcium-vitamin D (OSCAL WITH D) 500-200 MG-UNIT per tablet Take 1 tablet by mouth 2 (two) times daily.    . cholecalciferol (VITAMIN D) 1000 units tablet Take 1,000 Units by mouth daily. Patient takes 6 tablets daily    . Degarelix Acetate (FIRMAGON [place]) One injection a month    . Denosumab (XGEVA ) One injection a month    . fish oil-omega-3 fatty acids 1000 MG capsule Take 1 g by mouth daily.     . folic acid (FOLVITE) 1 MG tablet Take 1 mg by mouth daily.    Marland Kitchen levothyroxine (SYNTHROID, LEVOTHROID) 75 MCG tablet Take 75 mcg by mouth daily before breakfast.   3  . meloxicam (MOBIC) 7.5 MG tablet Take 7.5 mg by mouth daily.    . methotrexate (RHEUMATREX) 2.5 MG tablet Take 15 mg by mouth once a week. Takes on sundays.Marland KitchenMarland KitchenCaution:Chemotherapy. Protect from light.    . Multiple Vitamin (MULTIVITAMIN WITH MINERALS) TABS Take 1 tablet by mouth daily.    . predniSONE (DELTASONE) 5 MG tablet Take 1 tablet (5 mg total) by mouth daily with breakfast. 100 tablet  0  . simvastatin (ZOCOR) 20 MG tablet Take 1 tablet by mouth daily at 2 PM.  3  . vitamin E 400 UNIT capsule Take 800 Units by mouth daily.      No current facility-administered medications for this visit.      Allergies: No Known Allergies  Past Medical History, Surgical history, Social history, and Family History were reviewed and updated.  Physical Exam: Blood pressure (!) 157/86, pulse 77, temperature 97.8 F (36.6 C), temperature source Oral, resp. rate 17, height 5'  8" (1.727 m), weight 170 lb 8 oz (77.3 kg), SpO2 98 %. ECOG: 0 General appearance: Well-appearing gentleman appeared without distress. Head: Normocephalic, without obvious abnormality no oral ulcers or lesions. Neck: no adenopathy Lymph nodes: Cervical, supraclavicular, and axillary nodes normal. Heart:regular rate and rhythm, S1, S2 normal, no murmur, click, rub or gallop Lung:chest clear, no wheezing, rales, normal symmetric air entry Abdomin: soft, non-tender, without masses or organomegaly no shifting dullness or ascites. EXT:no edema noted.   Lab Results: Lab Results  Component Value Date   WBC 7.6 08/04/2016   HGB 14.4 08/04/2016   HCT 42.3 08/04/2016   MCV 97.9 08/04/2016   PLT 175 08/04/2016     Chemistry      Component Value Date/Time   NA 134 (L) 05/02/2016 1443   K 5.0 05/02/2016 1443   CO2 25 05/02/2016 1443   BUN 21.6 05/02/2016 1443   CREATININE 0.9 05/02/2016 1443      Component Value Date/Time   CALCIUM 9.5 05/02/2016 1443   ALKPHOS 72 05/02/2016 1443   AST 44 (H) 05/02/2016 1443   ALT 46 05/02/2016 1443   BILITOT 0.50 05/02/2016 1443      Results for Lucas Holloway, Lucas Holloway (MRN [id number]) as of 08/04/2016 14:33  Ref. Range 02/28/2016 07:55 03/29/2016 09:09 05/02/2016 14:39  PSA Latest Ref Range: 0.0 - 4.0 ng/mL 1.6 1.4 0.9      Impression and Plan:  76 year old gentleman with the following issues:  1. Prostate cancer initially diagnosed in 2009 without Gleason score 6, stage TIc and a PSA of around 9. He was treated with brachytherapy utilizing seed implants and developed recurrent disease in October 2016. He developed a bony metastasis and treated with androgen deprivation.   He developed castration resistant disease in June 2017 with progressive bony metastasis.  He started Zytiga on 01/07/2016 and tolerated it well. He exhibited excellent clinical response with improvement in his bone pain as well as decline in his PSA down to 0.9.  6 and benefits of  continuing this medication were reviewed today and he is agreeable to continue.  2. Bone directed therapy: He is currently receiving Xgeva on a monthly basis. He is on calcium and vitamin D supplements. He already obtained dental clearance prior to Xgeva start in 2016. This will continue moving forward every 6 weeks.  3. Androgen depravation: I recommended to continue therapy indefinitely. He will receive Lupron on 08/04/2016 and will be repeated in 3 months.  4. Liver function test and electrolytes surveillance: Within normal range in November 2017 and repeated today.  5. Follow-up: Will be 6 weeks.    Zola Button, MD 3/2/20182:51 PM

## 2016-08-05 LAB — PSA: PROSTATE SPECIFIC AG, SERUM: 1.2 ng/mL (ref 0.0–4.0)

## 2016-08-06 ENCOUNTER — Other Ambulatory Visit: Payer: Self-pay | Admitting: Oncology

## 2016-08-06 DIAGNOSIS — C61 Malignant neoplasm of prostate: Secondary | ICD-10-CM

## 2016-08-07 ENCOUNTER — Telehealth: Payer: Self-pay | Admitting: *Deleted

## 2016-08-07 DIAGNOSIS — L4059 Other psoriatic arthropathy: Secondary | ICD-10-CM | POA: Diagnosis not present

## 2016-08-07 DIAGNOSIS — Z6825 Body mass index (BMI) 25.0-25.9, adult: Secondary | ICD-10-CM | POA: Diagnosis not present

## 2016-08-07 NOTE — Telephone Encounter (Signed)
As noted by Dr. [name], I informed patient of his PSA level. Patient verbalized understanding.

## 2016-08-07 NOTE — Telephone Encounter (Signed)
Patient states he was seen by his rheumatologist and would like all of his lab results from here to be faxed to dr Amil Amen @ [phone]. Done.

## 2016-08-07 NOTE — Telephone Encounter (Signed)
-----   Message from Wyatt Portela, MD sent at 08/07/2016  8:39 AM EST ----- Please call him with his PSA. Little change.

## 2016-08-29 ENCOUNTER — Other Ambulatory Visit: Payer: Self-pay | Admitting: *Deleted

## 2016-08-29 DIAGNOSIS — C61 Malignant neoplasm of prostate: Secondary | ICD-10-CM

## 2016-08-29 MED ORDER — ABIRATERONE ACETATE 500 MG PO TABS: 1000.0000 mg | ORAL_TABLET | Freq: Every day | ORAL | 0 refills | Status: DC

## 2016-09-15 DIAGNOSIS — H524 Presbyopia: Secondary | ICD-10-CM | POA: Diagnosis not present

## 2016-09-15 DIAGNOSIS — D352 Benign neoplasm of pituitary gland: Secondary | ICD-10-CM | POA: Diagnosis not present

## 2016-09-15 DIAGNOSIS — Z961 Presence of intraocular lens: Secondary | ICD-10-CM | POA: Diagnosis not present

## 2016-09-18 ENCOUNTER — Telehealth: Payer: Self-pay | Admitting: Oncology

## 2016-09-18 NOTE — Telephone Encounter (Signed)
sw pt to confirm r/s appt to 4/25 at 115 pm per MD 4/20 CME

## 2016-09-22 ENCOUNTER — Ambulatory Visit: Payer: Medicare Other

## 2016-09-22 ENCOUNTER — Other Ambulatory Visit: Payer: Medicare Other

## 2016-09-22 ENCOUNTER — Ambulatory Visit: Payer: Medicare Other | Admitting: Oncology

## 2016-09-26 ENCOUNTER — Other Ambulatory Visit: Payer: Self-pay | Admitting: Oncology

## 2016-09-26 DIAGNOSIS — C7951 Secondary malignant neoplasm of bone: Secondary | ICD-10-CM

## 2016-09-27 ENCOUNTER — Ambulatory Visit (HOSPITAL_BASED_OUTPATIENT_CLINIC_OR_DEPARTMENT_OTHER): Payer: Medicare Other | Admitting: Oncology

## 2016-09-27 ENCOUNTER — Ambulatory Visit (HOSPITAL_BASED_OUTPATIENT_CLINIC_OR_DEPARTMENT_OTHER): Payer: Medicare Other

## 2016-09-27 ENCOUNTER — Other Ambulatory Visit (HOSPITAL_BASED_OUTPATIENT_CLINIC_OR_DEPARTMENT_OTHER): Payer: Medicare Other

## 2016-09-27 ENCOUNTER — Telehealth: Payer: Self-pay | Admitting: Oncology

## 2016-09-27 VITALS — BP 135/78 | HR 80 | Temp 98.1°F | Resp 18 | Ht 68.0 in | Wt 171.3 lb

## 2016-09-27 DIAGNOSIS — C61 Malignant neoplasm of prostate: Secondary | ICD-10-CM | POA: Diagnosis not present

## 2016-09-27 DIAGNOSIS — C7951 Secondary malignant neoplasm of bone: Secondary | ICD-10-CM

## 2016-09-27 DIAGNOSIS — E291 Testicular hypofunction: Secondary | ICD-10-CM

## 2016-09-27 LAB — COMPREHENSIVE METABOLIC PANEL
ALBUMIN: 3.8 g/dL (ref 3.5–5.0)
ALK PHOS: 94 U/L (ref 40–150)
ALT: 55 U/L (ref 0–55)
AST: 49 U/L — AB (ref 5–34)
Anion Gap: 11 mEq/L (ref 3–11)
BILIRUBIN TOTAL: 0.64 mg/dL (ref 0.20–1.20)
BUN: 19.2 mg/dL (ref 7.0–26.0)
CO2: 22 mEq/L (ref 22–29)
CREATININE: 0.8 mg/dL (ref 0.7–1.3)
Calcium: 9.1 mg/dL (ref 8.4–10.4)
Chloride: 104 mEq/L (ref 98–109)
EGFR: 87 mL/min/{1.73_m2} — ABNORMAL LOW (ref >90)
GLUCOSE: 124 mg/dL (ref 70–140)
Potassium: 4.8 mEq/L (ref 3.5–5.1)
SODIUM: 138 meq/L (ref 136–145)
TOTAL PROTEIN: 6.7 g/dL (ref 6.4–8.3)

## 2016-09-27 LAB — CBC WITH DIFFERENTIAL/PLATELET
BASO%: 0.8 % (ref 0.0–2.0)
Basophils Absolute: 0.1 10*3/uL (ref 0.0–0.1)
EOS%: 0.8 % (ref 0.0–7.0)
Eosinophils Absolute: 0.1 10*3/uL (ref 0.0–0.5)
HEMATOCRIT: 43.2 % (ref 38.4–49.9)
HEMOGLOBIN: 14.9 g/dL (ref 13.0–17.1)
LYMPH#: 0.8 10*3/uL — AB (ref 0.9–3.3)
LYMPH%: 12 % — ABNORMAL LOW (ref 14.0–49.0)
MCH: 33.6 pg — ABNORMAL HIGH (ref 27.2–33.4)
MCHC: 34.4 g/dL (ref 32.0–36.0)
MCV: 97.6 fL (ref 79.3–98.0)
MONO#: 0.5 10*3/uL (ref 0.1–0.9)
MONO%: 6.9 % (ref 0.0–14.0)
NEUT%: 79.5 % — ABNORMAL HIGH (ref 39.0–75.0)
NEUTROS ABS: 5.5 10*3/uL (ref 1.5–6.5)
Platelets: 169 10*3/uL (ref 140–400)
RBC: 4.43 10*6/uL (ref 4.20–5.82)
RDW: 14.9 % — AB (ref 11.0–14.6)
WBC: 7 10*3/uL (ref 4.0–10.3)

## 2016-09-27 MED ORDER — DENOSUMAB 120 MG/1.7ML ~~LOC~~ SOLN
120.0000 mg | Freq: Once | SUBCUTANEOUS | Status: AC
  Administered 2016-09-27: 120 mg via SUBCUTANEOUS
  Filled 2016-09-27: qty 1.7

## 2016-09-27 NOTE — Telephone Encounter (Signed)
Appointments scheduled per 09/27/16 los. Patient was given a copy of the AVS report and appointment schedule per 09/27/16 los. °

## 2016-09-27 NOTE — Patient Instructions (Signed)
Denosumab injection  What is this medicine?  DENOSUMAB (den oh sue mab) slows bone breakdown. Prolia is used to treat osteoporosis in women after menopause and in men. Xgeva is used to prevent bone fractures and other bone problems caused by cancer bone metastases. Xgeva is also used to treat giant cell tumor of the bone.  This medicine may be used for other purposes; ask your health care provider or pharmacist if you have questions.  What should I tell my health care provider before I take this medicine?  They need to know if you have any of these conditions:  -dental disease  -eczema  -infection or history of infections  -kidney disease or on dialysis  -low blood calcium or vitamin D  -malabsorption syndrome  -scheduled to have surgery or tooth extraction  -taking medicine that contains denosumab  -thyroid or parathyroid disease  -an unusual reaction to denosumab, other medicines, foods, dyes, or preservatives  -pregnant or trying to get pregnant  -breast-feeding  How should I use this medicine?  This medicine is for injection under the skin. It is given by a health care professional in a hospital or clinic setting.  If you are getting Prolia, a special MedGuide will be given to you by the pharmacist with each prescription and refill. Be sure to read this information carefully each time.  For Prolia, talk to your pediatrician regarding the use of this medicine in children. Special care may be needed. For Xgeva, talk to your pediatrician regarding the use of this medicine in children. While this drug may be prescribed for children as young as 13 years for selected conditions, precautions do apply.  Overdosage: If you think you have taken too much of this medicine contact a poison control center or emergency room at once.  NOTE: This medicine is only for you. Do not share this medicine with others.  What if I miss a dose?  It is important not to miss your dose. Call your doctor or health care professional if you are  unable to keep an appointment.  What may interact with this medicine?  Do not take this medicine with any of the following medications:  -other medicines containing denosumab  This medicine may also interact with the following medications:  -medicines that suppress the immune system  -medicines that treat cancer  -steroid medicines like prednisone or cortisone  This list may not describe all possible interactions. Give your health care provider a list of all the medicines, herbs, non-prescription drugs, or dietary supplements you use. Also tell them if you smoke, drink alcohol, or use illegal drugs. Some items may interact with your medicine.  What should I watch for while using this medicine?  Visit your doctor or health care professional for regular checks on your progress. Your doctor or health care professional may order blood tests and other tests to see how you are doing.  Call your doctor or health care professional if you get a cold or other infection while receiving this medicine. Do not treat yourself. This medicine may decrease your body's ability to fight infection.  You should make sure you get enough calcium and vitamin D while you are taking this medicine, unless your doctor tells you not to. Discuss the foods you eat and the vitamins you take with your health care professional.  See your dentist regularly. Brush and floss your teeth as directed. Before you have any dental work done, tell your dentist you are receiving this medicine.  Do   not become pregnant while taking this medicine or for 5 months after stopping it. Women should inform their doctor if they wish to become pregnant or think they might be pregnant. There is a potential for serious side effects to an unborn child. Talk to your health care professional or pharmacist for more information.  What side effects may I notice from receiving this medicine?  Side effects that you should report to your doctor or health care professional as soon as  possible:  -allergic reactions like skin rash, itching or hives, swelling of the face, lips, or tongue  -breathing problems  -chest pain  -fast, irregular heartbeat  -feeling faint or lightheaded, falls  -fever, chills, or any other sign of infection  -muscle spasms, tightening, or twitches  -numbness or tingling  -skin blisters or bumps, or is dry, peels, or red  -slow healing or unexplained pain in the mouth or jaw  -unusual bleeding or bruising  Side effects that usually do not require medical attention (Report these to your doctor or health care professional if they continue or are bothersome.):  -muscle pain  -stomach upset, gas  This list may not describe all possible side effects. Call your doctor for medical advice about side effects. You may report side effects to FDA at 1-800-FDA-1088.  Where should I keep my medicine?  This medicine is only given in a clinic, doctor's office, or other health care setting and will not be stored at home.  NOTE: This sheet is a summary. It may not cover all possible information. If you have questions about this medicine, talk to your doctor, pharmacist, or health care provider.      2016, [employer]vier/Gold Standard. (2011-11-20 12:37:47)

## 2016-09-27 NOTE — Progress Notes (Signed)
Hematology and Oncology Follow Up Visit  Zakariyya Helfman [id number] 1940-10-04 76 y.o. 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 He was initially diagnosed in 2009 without Gleason score 6, stage TIc and a PSA of around 9.   Prior Therapy: He underwent seed implant brachytherapy with excellent PSA response initially. His PSA nadir was 0.7 and 2013.  His PSA did go up to 1.2 in October 2016 however. His staging workup at that time including a bone scan which showed metastatic bony disease.  He was started on androgen deprivation under the care of Dr. [name] and his PSA did drop down initially to 0.61 in May 2017.  On 12/02/2015 his PSA was 1.44 with a testosterone level of 32. A repeat a bone scan obtained on 12/13/2015 showed progression of disease with increased uptake in the anterior and posterior ribs, sternum and right humerus  Current therapy:  Zytiga 1000 mg daily with prednisone 5 mg daily started on 01/07/2016. He is receiving Xgeva every 6 weeks. He is currently on Lupron every 3 months.   Interim History: Mr. Puleo presents today for a follow-up visit. Since the last visit, he reports doing very well without any recent complaints. He continues to tolerate Zytiga without any major complications. He denied lower extremity edema, nausea, pruritus or other GI toxicities. His appetite is excellent and have gained a few pounds since the last visit. He continues to attend activities of daily living. He had any pathological fractures or bone pain. He denied any arthralgias or myalgias since the start of Zytiga.  He does not report any headaches, blurry vision, syncope or seizures. He does not report any fevers or chills or sweats. He does not report any cough, wheezing or hemoptysis. He does not report any nausea, vomiting or abdominal pain. He does not report  any frequency urgency or hesitancy. He does not report any skeletal complaints of arthralgias myalgias. He does not report any lymphadenopathy or petechiae. Remaining review of systems unremarkable.   Medications: I have reviewed the patient's current medications.  Current Outpatient Prescriptions  Medication Sig Dispense Refill  . Abiraterone Acetate (ZYTIGA) 500 MG TABS Take 1,000 mg by mouth daily. 60 tablet 0  . calcium-vitamin D (OSCAL WITH D) 500-200 MG-UNIT per tablet Take 1 tablet by mouth 2 (two) times daily.    . cholecalciferol (VITAMIN D) 1000 units tablet Take 1,000 Units by mouth daily. Patient takes 6 tablets daily    . Degarelix Acetate (FIRMAGON [place]) One injection a month    . Denosumab (XGEVA [place]) One injection a month    . fish oil-omega-3 fatty acids 1000 MG capsule Take 1 g by mouth daily.     . folic acid (FOLVITE) 1 MG tablet Take 1 mg by mouth daily.    Marland Kitchen levothyroxine (SYNTHROID, LEVOTHROID) 75 MCG tablet Take 75 mcg by mouth daily before breakfast.   3  . meloxicam (MOBIC) 7.5 MG tablet Take 7.5 mg by mouth daily.    . methotrexate (RHEUMATREX) 2.5 MG tablet Take 15 mg by mouth once a week. Takes on sundays.Marland KitchenMarland KitchenCaution:Chemotherapy. Protect from light.    . Multiple Vitamin (MULTIVITAMIN WITH MINERALS) TABS Take 1 tablet by mouth daily.    . predniSONE (DELTASONE) 5 MG tablet Take 1 tablet (5 mg total) by mouth daily with breakfast. 100 tablet 0  . predniSONE (DELTASONE) 5 MG tablet TAKE 1 TABLET BY MOUTH EVERY  DAY WITH BREAKFAST 100 tablet 0  . simvastatin (ZOCOR) 20 MG tablet Take 1 tablet by mouth daily at 2 PM.  3  . vitamin E 400 UNIT capsule Take 800 Units by mouth daily.      No current facility-administered medications for this visit.      Allergies: No Known Allergies  Past Medical History, Surgical history, Social history, and Family History were reviewed and updated.  Physical Exam: Blood pressure 135/78, pulse 80, temperature 98.1 F (36.7 C),  temperature source Oral, resp. rate 18, height 5\' 8"  (1.727 m), weight 171 lb 4.8 oz (77.7 kg), SpO2 97 %. ECOG: 0 General appearance: Alert, awake gentleman without distress. Head: Normocephalic, without obvious abnormality no oral ulcers or lesions. Neck: no adenopathy Lymph nodes: Cervical, supraclavicular, and axillary nodes normal. Heart:regular rate and rhythm, S1, S2 normal, no murmur, click, rub or gallop Lung:chest clear, no wheezing, rales, normal symmetric air entry Abdomin: soft, non-tender, without masses or organomegaly no rebound or guarding. EXT:no edema noted.   Lab Results: Lab Results  Component Value Date   WBC 7.0 09/27/2016   HGB 14.9 09/27/2016   HCT 43.2 09/27/2016   MCV 97.6 09/27/2016   PLT 169 09/27/2016     Chemistry      Component Value Date/Time   NA 136 08/04/2016 1422   K 4.8 08/04/2016 1422   CO2 22 08/04/2016 1422   BUN 18.6 08/04/2016 1422   CREATININE 0.8 08/04/2016 1422      Component Value Date/Time   CALCIUM 9.2 08/04/2016 1422   ALKPHOS 82 08/04/2016 1422   AST 42 (H) 08/04/2016 1422   ALT 43 08/04/2016 1422   BILITOT 0.44 08/04/2016 1422      Results for OWAIN, ECKERMAN (MRN [id number]) as of 09/27/2016 13:17  Ref. Range 05/02/2016 14:39 08/04/2016 14:22  PSA Latest Ref Range: 0.0 - 4.0 ng/mL 0.9 1.2      Impression and Plan:  76 year old gentleman with the following issues:  1. Prostate cancer initially diagnosed in 2009 without Gleason score 6, stage TIc and a PSA of around 9. He was treated with brachytherapy utilizing seed implants and developed recurrent disease in October 2016. He developed a bony metastasis and treated with androgen deprivation.   He developed castration resistant disease in June 2017 with progressive bony metastasis.  He started Zytiga on 01/07/2016 and tolerated it well.   He continues to benefit from this medication clinically with reasonable control his PSA. The plan is to continue with the same dose  and schedule unless his PSA starts to rise again or he started developing worsening bone pain. As always therapy will be used at that time.  2. Bone directed therapy: He is currently receiving Xgeva on a monthly basis. He is on calcium and vitamin D supplements. He already obtained dental clearance prior to Xgeva start in 2016. He will receive this medication every 6 weeks.  3. Androgen depravation: I recommended to continue therapy indefinitely. He will receive Lupron on 08/04/2016 and will be repeated in June 2018.  4. Liver function test and electrolytes surveillance: Within normal range in March 2018.  5. Follow-up: Will be 6 weeks.    Zola Button, MD 4/25/20181:31 PM

## 2016-09-28 ENCOUNTER — Telehealth: Payer: Self-pay | Admitting: *Deleted

## 2016-09-28 ENCOUNTER — Other Ambulatory Visit: Payer: Self-pay | Admitting: *Deleted

## 2016-09-28 DIAGNOSIS — C61 Malignant neoplasm of prostate: Secondary | ICD-10-CM

## 2016-09-28 LAB — PSA: Prostate Specific Ag, Serum: 1.6 ng/mL (ref 0.0–4.0)

## 2016-09-28 MED ORDER — ABIRATERONE ACETATE 500 MG PO TABS: 1000.0000 mg | ORAL_TABLET | Freq: Every day | ORAL | 0 refills | Status: DC

## 2016-09-28 NOTE — Telephone Encounter (Signed)
-----   Message from Wyatt Portela, MD sent at 09/28/2016  9:12 AM EDT ----- Please let him know his PSA is not changed much. Stay on [place]

## 2016-09-28 NOTE — Telephone Encounter (Signed)
As noted below by Dr. [name], I informed patient of his PSA level. Patient verbalized understanding of result and to stay on Zytiga.

## 2016-10-04 ENCOUNTER — Telehealth: Payer: Self-pay | Admitting: Pharmacist

## 2016-10-04 NOTE — Telephone Encounter (Signed)
Oral Chemotherapy Pharmacist Encounter  Received call from patient today with complaints about service from [facility].   Patient states pharmacy shipped his medication to different address than requested and will end up with a 1-2 day break in [place] treatment. He spoke with [facility] about this yesterday (5/1) and he should receive this next fill of Zytiga by tomorrow night (5/3) at 8pm at correct address.  Patient requests that all future Zytiga prescriptions be sent to the [facility] at the corner of [address] in [place], [place].  He will call the office on Friday (5/4) if he did not receive his Zytiga Thursday to have the office send an Rx to [facility] on Friday. There will be no Oral Oncology Clinic on 5/4 so patient instructed to call Dr. Hazeline Junker collaborative practice RN that day to have prescription sent in if needed.  Oral Oncology Clinic will continue to follow.  Johny Drilling, PharmD, BCPS, BCOP 10/04/2016  11:14 AM Oral Oncology Clinic [phone]

## 2016-10-16 DIAGNOSIS — Z79899 Other long term (current) drug therapy: Secondary | ICD-10-CM | POA: Diagnosis not present

## 2016-10-16 DIAGNOSIS — E039 Hypothyroidism, unspecified: Secondary | ICD-10-CM | POA: Diagnosis not present

## 2016-10-16 DIAGNOSIS — E782 Mixed hyperlipidemia: Secondary | ICD-10-CM | POA: Diagnosis not present

## 2016-10-18 DIAGNOSIS — Z0000 Encounter for general adult medical examination without abnormal findings: Secondary | ICD-10-CM | POA: Diagnosis not present

## 2016-10-18 DIAGNOSIS — Z6826 Body mass index (BMI) 26.0-26.9, adult: Secondary | ICD-10-CM | POA: Diagnosis not present

## 2016-10-18 DIAGNOSIS — E782 Mixed hyperlipidemia: Secondary | ICD-10-CM | POA: Diagnosis not present

## 2016-10-18 DIAGNOSIS — H6123 Impacted cerumen, bilateral: Secondary | ICD-10-CM | POA: Diagnosis not present

## 2016-10-18 DIAGNOSIS — C61 Malignant neoplasm of prostate: Secondary | ICD-10-CM | POA: Diagnosis not present

## 2016-10-18 DIAGNOSIS — E559 Vitamin D deficiency, unspecified: Secondary | ICD-10-CM | POA: Diagnosis not present

## 2016-10-18 DIAGNOSIS — E039 Hypothyroidism, unspecified: Secondary | ICD-10-CM | POA: Diagnosis not present

## 2016-10-25 ENCOUNTER — Encounter: Payer: Self-pay | Admitting: *Deleted

## 2016-10-25 ENCOUNTER — Other Ambulatory Visit: Payer: Self-pay | Admitting: *Deleted

## 2016-10-25 DIAGNOSIS — C61 Malignant neoplasm of prostate: Secondary | ICD-10-CM

## 2016-10-25 MED ORDER — ABIRATERONE ACETATE 500 MG PO TABS: 1000.0000 mg | ORAL_TABLET | Freq: Every day | ORAL | 0 refills | Status: DC

## 2016-10-27 ENCOUNTER — Ambulatory Visit: Payer: Medicare Other | Admitting: Internal Medicine

## 2016-10-31 ENCOUNTER — Other Ambulatory Visit: Payer: Self-pay | Admitting: *Deleted

## 2016-10-31 DIAGNOSIS — C61 Malignant neoplasm of prostate: Secondary | ICD-10-CM

## 2016-10-31 MED ORDER — ABIRATERONE ACETATE 500 MG PO TABS: 2.0000 | ORAL_TABLET | Freq: Every day | ORAL | 0 refills | Status: DC

## 2016-11-07 ENCOUNTER — Other Ambulatory Visit: Payer: Self-pay | Admitting: Oncology

## 2016-11-07 DIAGNOSIS — Z6825 Body mass index (BMI) 25.0-25.9, adult: Secondary | ICD-10-CM | POA: Diagnosis not present

## 2016-11-07 DIAGNOSIS — L4059 Other psoriatic arthropathy: Secondary | ICD-10-CM | POA: Diagnosis not present

## 2016-11-09 ENCOUNTER — Ambulatory Visit (HOSPITAL_BASED_OUTPATIENT_CLINIC_OR_DEPARTMENT_OTHER): Payer: Medicare Other | Admitting: Oncology

## 2016-11-09 ENCOUNTER — Ambulatory Visit (HOSPITAL_BASED_OUTPATIENT_CLINIC_OR_DEPARTMENT_OTHER): Payer: Medicare Other

## 2016-11-09 ENCOUNTER — Telehealth: Payer: Self-pay | Admitting: Oncology

## 2016-11-09 ENCOUNTER — Other Ambulatory Visit (HOSPITAL_BASED_OUTPATIENT_CLINIC_OR_DEPARTMENT_OTHER): Payer: Medicare Other

## 2016-11-09 VITALS — BP 133/78 | HR 83 | Temp 98.0°F | Resp 18 | Ht 68.0 in | Wt 169.9 lb

## 2016-11-09 DIAGNOSIS — Z5111 Encounter for antineoplastic chemotherapy: Secondary | ICD-10-CM | POA: Diagnosis not present

## 2016-11-09 DIAGNOSIS — C61 Malignant neoplasm of prostate: Secondary | ICD-10-CM

## 2016-11-09 DIAGNOSIS — E291 Testicular hypofunction: Secondary | ICD-10-CM | POA: Diagnosis not present

## 2016-11-09 DIAGNOSIS — C7951 Secondary malignant neoplasm of bone: Secondary | ICD-10-CM

## 2016-11-09 LAB — COMPREHENSIVE METABOLIC PANEL
ALT: 41 U/L (ref 0–55)
AST: 47 U/L — AB (ref 5–34)
Albumin: 3.6 g/dL (ref 3.5–5.0)
Alkaline Phosphatase: 94 U/L (ref 40–150)
Anion Gap: 9 mEq/L (ref 3–11)
BUN: 23.4 mg/dL (ref 7.0–26.0)
CALCIUM: 9.1 mg/dL (ref 8.4–10.4)
CHLORIDE: 104 meq/L (ref 98–109)
CO2: 22 mEq/L (ref 22–29)
CREATININE: 0.9 mg/dL (ref 0.7–1.3)
EGFR: 81 mL/min/{1.73_m2} — ABNORMAL LOW (ref >90)
GLUCOSE: 124 mg/dL (ref 70–140)
Potassium: 5 mEq/L (ref 3.5–5.1)
SODIUM: 135 meq/L — AB (ref 136–145)
Total Bilirubin: 0.84 mg/dL (ref 0.20–1.20)
Total Protein: 6.5 g/dL (ref 6.4–8.3)

## 2016-11-09 LAB — CBC WITH DIFFERENTIAL/PLATELET
BASO%: 0.9 % (ref 0.0–2.0)
Basophils Absolute: 0.1 10*3/uL (ref 0.0–0.1)
EOS%: 0.4 % (ref 0.0–7.0)
Eosinophils Absolute: 0 10*3/uL (ref 0.0–0.5)
HEMATOCRIT: 42.2 % (ref 38.4–49.9)
HGB: 14.4 g/dL (ref 13.0–17.1)
LYMPH#: 0.7 10*3/uL — AB (ref 0.9–3.3)
LYMPH%: 8.5 % — ABNORMAL LOW (ref 14.0–49.0)
MCH: 33.5 pg — ABNORMAL HIGH (ref 27.2–33.4)
MCHC: 34.2 g/dL (ref 32.0–36.0)
MCV: 97.9 fL (ref 79.3–98.0)
MONO#: 0.5 10*3/uL (ref 0.1–0.9)
MONO%: 5.6 % (ref 0.0–14.0)
NEUT#: 6.9 10*3/uL — ABNORMAL HIGH (ref 1.5–6.5)
NEUT%: 84.6 % — AB (ref 39.0–75.0)
Platelets: 167 10*3/uL (ref 140–400)
RBC: 4.31 10*6/uL (ref 4.20–5.82)
RDW: 14.7 % — ABNORMAL HIGH (ref 11.0–14.6)
WBC: 8.2 10*3/uL (ref 4.0–10.3)

## 2016-11-09 MED ORDER — LEUPROLIDE ACETATE (4 MONTH) 30 MG IM KIT
22.5000 mg | PACK | Freq: Once | INTRAMUSCULAR | Status: AC
  Administered 2016-11-09: 22.5 mg via INTRAMUSCULAR
  Filled 2016-11-09: qty 30

## 2016-11-09 MED ORDER — DENOSUMAB 120 MG/1.7ML ~~LOC~~ SOLN
120.0000 mg | Freq: Once | SUBCUTANEOUS | Status: AC
  Administered 2016-11-09: 120 mg via SUBCUTANEOUS
  Filled 2016-11-09: qty 1.7

## 2016-11-09 NOTE — Patient Instructions (Signed)
Leuprolide depot injection What is this medicine? LEUPROLIDE (loo PROE lide) is a man-made protein that acts like a natural hormone in the body. It decreases testosterone in men and decreases estrogen in women. In men, this medicine is used to treat advanced prostate cancer. In women, some forms of this medicine may be used to treat endometriosis, uterine fibroids, or other male hormone-related problems. This medicine may be used for other purposes; ask your health care provider or pharmacist if you have questions. COMMON BRAND NAME(S): Eligard, Lupron Depot, Lupron Depot-Ped, Viadur What should I tell my health care provider before I take this medicine? They need to know if you have any of these conditions: -diabetes -heart disease or previous heart attack -high blood pressure -high cholesterol -mental illness -osteoporosis -pain or difficulty passing urine -seizures -spinal cord metastasis -stroke -suicidal thoughts, plans, or attempt; a previous suicide attempt by you or a family member -tobacco smoker -unusual vaginal bleeding (women) -an unusual or allergic reaction to leuprolide, benzyl alcohol, other medicines, foods, dyes, or preservatives -pregnant or trying to get pregnant -breast-feeding How should I use this medicine? This medicine is for injection into a muscle or for injection under the skin. It is given by a health care professional in a hospital or clinic setting. The specific product will determine how it will be given to you. Make sure you understand which product you receive and how often you will receive it. Talk to your pediatrician regarding the use of this medicine in children. Special care may be needed. Overdosage: If you think you have taken too much of this medicine contact a poison control center or emergency room at once. NOTE: This medicine is only for you. Do not share this medicine with others. What if I miss a dose? It is important not to miss a dose.  Call your doctor or health care professional if you are unable to keep an appointment. Depot injections: Depot injections are given either once-monthly, every 12 weeks, every 16 weeks, or every 24 weeks depending on the product you are prescribed. The product you are prescribed will be based on if you are male or male, and your condition. Make sure you understand your product and dosing. What may interact with this medicine? Do not take this medicine with any of the following medications: -chasteberry This medicine may also interact with the following medications: -herbal or dietary supplements, like black cohosh or DHEA -male hormones, like estrogens or progestins and birth control pills, patches, rings, or injections -male hormones, like testosterone This list may not describe all possible interactions. Give your health care provider a list of all the medicines, herbs, non-prescription drugs, or dietary supplements you use. Also tell them if you smoke, drink alcohol, or use illegal drugs. Some items may interact with your medicine. What should I watch for while using this medicine? Visit your doctor or health care professional for regular checks on your progress. During the first weeks of treatment, your symptoms may get worse, but then will improve as you continue your treatment. You may get hot flashes, increased bone pain, increased difficulty passing urine, or an aggravation of nerve symptoms. Discuss these effects with your doctor or health care professional, some of them may improve with continued use of this medicine. Male patients may experience a menstrual cycle or spotting during the first months of therapy with this medicine. If this continues, contact your doctor or health care professional. What side effects may I notice from receiving this medicine? Side   effects that you should report to your doctor or health care professional as soon as possible: -allergic reactions like skin  rash, itching or hives, swelling of the face, lips, or tongue -breathing problems -chest pain -depression or memory disorders -pain in your legs or groin -pain at site where injected or implanted -seizures -severe headache -swelling of the feet and legs -suicidal thoughts or other mood changes -visual changes -vomiting Side effects that usually do not require medical attention (report to your doctor or health care professional if they continue or are bothersome): -breast swelling or tenderness -decrease in sex drive or performance -diarrhea -hot flashes -loss of appetite -muscle, joint, or bone pains -nausea -redness or irritation at site where injected or implanted -skin problems or acne This list may not describe all possible side effects. Call your doctor for medical advice about side effects. You may report side effects to FDA at 1-800-FDA-1088. Where should I keep my medicine? This drug is given in a hospital or clinic and will not be stored at home. NOTE: This sheet is a summary. It may not cover all possible information. If you have questions about this medicine, talk to your doctor, pharmacist, or health care provider.  2018 Elsevier/Gold Standard (2015-11-04 09:45:53) Denosumab injection What is this medicine? DENOSUMAB (den oh sue mab) slows bone breakdown. Prolia is used to treat osteoporosis in women after menopause and in men. Xgeva is used to prevent bone fractures and other bone problems caused by cancer bone metastases. Xgeva is also used to treat giant cell tumor of the bone. This medicine may be used for other purposes; ask your health care provider or pharmacist if you have questions. What should I tell my health care provider before I take this medicine? They need to know if you have any of these conditions: -dental disease -eczema -infection or history of infections -kidney disease or on dialysis -low blood calcium or vitamin D -malabsorption  syndrome -scheduled to have surgery or tooth extraction -taking medicine that contains denosumab -thyroid or parathyroid disease -an unusual reaction to denosumab, other medicines, foods, dyes, or preservatives -pregnant or trying to get pregnant -breast-feeding How should I use this medicine? This medicine is for injection under the skin. It is given by a health care professional in a hospital or clinic setting. If you are getting Prolia, a special MedGuide will be given to you by the pharmacist with each prescription and refill. Be sure to read this information carefully each time. For Prolia, talk to your pediatrician regarding the use of this medicine in children. Special care may be needed. For Xgeva, talk to your pediatrician regarding the use of this medicine in children. While this drug may be prescribed for children as young as 13 years for selected conditions, precautions do apply. Overdosage: If you think you have taken too much of this medicine contact a poison control center or emergency room at once. NOTE: This medicine is only for you. Do not share this medicine with others. What if I miss a dose? It is important not to miss your dose. Call your doctor or health care professional if you are unable to keep an appointment. What may interact with this medicine? Do not take this medicine with any of the following medications: -other medicines containing denosumab This medicine may also interact with the following medications: -medicines that suppress the immune system -medicines that treat cancer -steroid medicines like prednisone or cortisone This list may not describe all possible interactions. Give your health care   provider a list of all the medicines, herbs, non-prescription drugs, or dietary supplements you use. Also tell them if you smoke, drink alcohol, or use illegal drugs. Some items may interact with your medicine. What should I watch for while using this medicine? Visit  your doctor or health care professional for regular checks on your progress. Your doctor or health care professional may order blood tests and other tests to see how you are doing. Call your doctor or health care professional if you get a cold or other infection while receiving this medicine. Do not treat yourself. This medicine may decrease your body's ability to fight infection. You should make sure you get enough calcium and vitamin D while you are taking this medicine, unless your doctor tells you not to. Discuss the foods you eat and the vitamins you take with your health care professional. See your dentist regularly. Brush and floss your teeth as directed. Before you have any dental work done, tell your dentist you are receiving this medicine. Do not become pregnant while taking this medicine or for 5 months after stopping it. Women should inform their doctor if they wish to become pregnant or think they might be pregnant. There is a potential for serious side effects to an unborn child. Talk to your health care professional or pharmacist for more information. What side effects may I notice from receiving this medicine? Side effects that you should report to your doctor or health care professional as soon as possible: -allergic reactions like skin rash, itching or hives, swelling of the face, lips, or tongue -breathing problems -chest pain -fast, irregular heartbeat -feeling faint or lightheaded, falls -fever, chills, or any other sign of infection -muscle spasms, tightening, or twitches -numbness or tingling -skin blisters or bumps, or is dry, peels, or red -slow healing or unexplained pain in the mouth or jaw -unusual bleeding or bruising Side effects that usually do not require medical attention (Report these to your doctor or health care professional if they continue or are bothersome.): -muscle pain -stomach upset, gas This list may not describe all possible side effects. Call your  doctor for medical advice about side effects. You may report side effects to FDA at 1-800-FDA-1088. Where should I keep my medicine? This medicine is only given in a clinic, doctor's office, or other health care setting and will not be stored at home. NOTE: This sheet is a summary. It may not cover all possible information. If you have questions about this medicine, talk to your doctor, pharmacist, or health care provider.    2016, Elsevier/Gold Standard. (2011-11-20 12:37:47)  

## 2016-11-09 NOTE — Telephone Encounter (Signed)
Gave patient AVS and calender per 6/7 los - [facility] to contact patient with Ct schedule.  

## 2016-11-09 NOTE — Progress Notes (Signed)
Hematology and Oncology Follow Up Visit  Lucas Holloway [id number] Oct 30, 1940 76 y.o. 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 He was initially diagnosed in 2009 without Gleason score 6, stage TIc and a PSA of around 9.   Prior Therapy: He underwent seed implant brachytherapy with excellent PSA response initially. His PSA nadir was 0.7 and 2013.  His PSA did go up to 1.2 in October 2016 however. His staging workup at that time including a bone scan which showed metastatic bony disease.  He was started on androgen deprivation under the care of Dr. [name] and his PSA did drop down initially to 0.61 in May 2017.  On 12/02/2015 his PSA was 1.44 with a testosterone level of 32. A repeat a bone scan obtained on 12/13/2015 showed progression of disease with increased uptake in the anterior and posterior ribs, sternum and right humerus  Current therapy:  Zytiga 1000 mg daily with prednisone 5 mg daily started on 01/07/2016. He is receiving Xgeva every 6 weeks. He is currently on Lupron every 3 months.   Interim History: Lucas Holloway presents today for a follow-up visit. Since the last visit, he continues to do very well without any recent complaints. He remains active including playing golf and attending to activities of daily living. He continues to tolerate Zytiga without any major complications. He denied lower extremity edema, nausea, pruritus or other GI toxicities. His appetite remains about the same and weight is stable. He denied any pathological fractures or bone pain.  He does not report any headaches, blurry vision, syncope or seizures. He does not report any fevers or chills or sweats. He does not report any cough, wheezing or hemoptysis. He does not report any nausea, vomiting or abdominal pain. He does not report any frequency urgency or hesitancy. He  does not report any skeletal complaints of arthralgias myalgias. He does not report any lymphadenopathy or petechiae. Remaining review of systems unremarkable.   Medications: I have reviewed the patient's current medications.  Current Outpatient Prescriptions  Medication Sig Dispense Refill  . Abiraterone Acetate (ZYTIGA) 500 MG TABS Take 2 tablets by mouth daily. 60 tablet 0  . calcium-vitamin D (OSCAL WITH D) 500-200 MG-UNIT per tablet Take 1 tablet by mouth 2 (two) times daily.    . cholecalciferol (VITAMIN D) 1000 units tablet Take 1,000 Units by mouth daily. Patient takes 6 tablets daily    . Degarelix Acetate (FIRMAGON [place]) One injection a month    . Denosumab (XGEVA [place]) One injection a month    . fish oil-omega-3 fatty acids 1000 MG capsule Take 1 g by mouth daily.     . folic acid (FOLVITE) 1 MG tablet Take 1 mg by mouth daily.    Marland Kitchen levothyroxine (SYNTHROID, LEVOTHROID) 75 MCG tablet Take 75 mcg by mouth daily before breakfast.   3  . meloxicam (MOBIC) 7.5 MG tablet Take 7.5 mg by mouth daily.    . methotrexate (RHEUMATREX) 2.5 MG tablet Take 15 mg by mouth once a week. Takes on sundays.Marland KitchenMarland KitchenCaution:Chemotherapy. Protect from light.    . Multiple Vitamin (MULTIVITAMIN WITH MINERALS) TABS Take 1 tablet by mouth daily.    . predniSONE (DELTASONE) 5 MG tablet Take 1 tablet (5 mg total) by mouth daily with breakfast. 100 tablet 0  . predniSONE (DELTASONE) 5 MG tablet TAKE 1 TABLET BY MOUTH EVERY DAY WITH BREAKFAST 100 tablet 0  .  simvastatin (ZOCOR) 20 MG tablet Take 1 tablet by mouth daily at 2 PM.  3  . vitamin E 400 UNIT capsule Take 800 Units by mouth daily.      No current facility-administered medications for this visit.      Allergies: No Known Allergies  Past Medical History, Surgical history, Social history, and Family History were reviewed and updated.  Physical Exam: Blood pressure 133/78, pulse 83, temperature 98 F (36.7 C), temperature source Oral, resp. rate 18, height  5\' 8"  (1.727 m), weight 169 lb 14.4 oz (77.1 kg), SpO2 98 %. ECOG: 0 General appearance: Well-appearing gentleman without distress. Head: Normocephalic, without obvious abnormality no oral thrush or ulcers. Neck: no adenopathy Lymph nodes: Cervical, supraclavicular, and axillary nodes normal. Heart:regular rate and rhythm, S1, S2 normal, no murmur, click, rub or gallop Lung:chest clear, no wheezing, rales, normal symmetric air entry Abdomin: soft, non-tender, without masses or organomegaly no shifting dullness or ascites. EXT:no edema noted.   Lab Results: Lab Results  Component Value Date   WBC 8.2 11/09/2016   HGB 14.4 11/09/2016   HCT 42.2 11/09/2016   MCV 97.9 11/09/2016   PLT 167 11/09/2016     Chemistry      Component Value Date/Time   NA 138 09/27/2016 1304   K 4.8 09/27/2016 1304   CO2 22 09/27/2016 1304   BUN 19.2 09/27/2016 1304   CREATININE 0.8 09/27/2016 1304      Component Value Date/Time   CALCIUM 9.1 09/27/2016 1304   ALKPHOS 94 09/27/2016 1304   AST 49 (H) 09/27/2016 1304   ALT 55 09/27/2016 1304   BILITOT 0.64 09/27/2016 1304      Results for UEL, Lucas Holloway (MRN [id number]) as of 11/09/2016 13:06  Ref. Range 05/02/2016 14:39 08/04/2016 14:22 09/27/2016 13:04  PSA Latest Ref Range: 0.0 - 4.0 ng/mL 0.9 1.2 1.6       Impression and Plan:  76 year old gentleman with the following issues:  1. Prostate cancer initially diagnosed in 2009 without Gleason score 6, stage TIc and a PSA of around 9. He was treated with brachytherapy utilizing seed implants and developed recurrent disease in October 2016. He developed a bony metastasis and treated with androgen deprivation.   He developed castration resistant disease in June 2017 with progressive bony metastasis.  He started Zytiga on 01/07/2016 and tolerated it well. His PSA has been rising slowly although he is completely asymptomatic. He continues to benefit clinically from this treatment despite the rise in his  PSA.  Plan is to continue with the same dose and schedule and repeat a bone scan in August 2018.    2. Bone directed therapy: He is currently receiving Xgeva on a monthly basis. He is on calcium and vitamin D supplements. He already obtained dental clearance prior to Xgeva start in 2016. He will receive this injection today and be repeated every visit. His next visit will be in August 2018.  3. Androgen depravation: I recommended to continue therapy indefinitely. He will receive Lupron on 08/04/2016 and will be repeated in June 2018. This will be repeated every 3 months at a dose of 22.5 mg daily.  4. Liver function test and electrolytes surveillance: Within normal range in March 2018.  5. Follow-up: Will be in August 2018 based on his request. He is traveling out of town until that date and he will return and have a restaging workup including a repeat PSA at that time.    Zola Button, MD 6/7/20181:20 PM

## 2016-11-09 NOTE — Addendum Note (Signed)
Addended by: Randolm Idol on: 11/09/2016 01:32 PM   Modules accepted: Orders

## 2016-11-10 ENCOUNTER — Telehealth: Payer: Self-pay | Admitting: *Deleted

## 2016-11-10 ENCOUNTER — Encounter: Payer: Self-pay | Admitting: *Deleted

## 2016-11-10 LAB — PSA: Prostate Specific Ag, Serum: 2.2 ng/mL (ref 0.0–4.0)

## 2016-11-10 NOTE — Telephone Encounter (Signed)
-----   Message from Wyatt Portela, MD sent at 11/10/2016  8:23 AM EDT ----- Please let him know his PSA slightly up. No change for now.

## 2016-11-10 NOTE — Telephone Encounter (Signed)
Per patient's request, faxed labs and Dr. Hazeline Junker o.v. Note from 11/09/16 to dr Leigh Aurora [phone]

## 2016-11-10 NOTE — Progress Notes (Unsigned)
Per patient's request, faxed labs and o.v. Note from 11/09/16 to dr Leigh Aurora [phone]

## 2016-11-10 NOTE — Telephone Encounter (Signed)
Spoke with patient, gave results of last PSA 

## 2016-11-21 ENCOUNTER — Other Ambulatory Visit: Payer: Self-pay | Admitting: Oncology

## 2016-11-21 ENCOUNTER — Telehealth: Payer: Self-pay

## 2016-11-21 DIAGNOSIS — C61 Malignant neoplasm of prostate: Secondary | ICD-10-CM

## 2016-11-21 NOTE — Telephone Encounter (Signed)
Pt called for prednisone refill to [facility] on [address]. He is going out of town.

## 2016-11-23 ENCOUNTER — Other Ambulatory Visit: Payer: Self-pay | Admitting: Oncology

## 2016-11-23 DIAGNOSIS — C61 Malignant neoplasm of prostate: Secondary | ICD-10-CM

## 2016-12-15 ENCOUNTER — Other Ambulatory Visit: Payer: Self-pay | Admitting: Oncology

## 2016-12-15 DIAGNOSIS — C61 Malignant neoplasm of prostate: Secondary | ICD-10-CM

## 2016-12-22 ENCOUNTER — Other Ambulatory Visit: Payer: Self-pay

## 2017-01-10 ENCOUNTER — Other Ambulatory Visit (HOSPITAL_BASED_OUTPATIENT_CLINIC_OR_DEPARTMENT_OTHER): Payer: Medicare Other

## 2017-01-10 ENCOUNTER — Encounter (HOSPITAL_COMMUNITY)
Admission: RE | Admit: 2017-01-10 | Discharge: 2017-01-10 | Disposition: A | Discharge status: Home / Self Care | Payer: Medicare Other | Source: Ambulatory Visit | Attending: Oncology | Admitting: Oncology

## 2017-01-10 ENCOUNTER — Ambulatory Visit (HOSPITAL_COMMUNITY)
Admission: RE | Admit: 2017-01-10 | Discharge: 2017-01-10 | Disposition: A | Discharge status: Home / Self Care | Payer: Medicare Other | Source: Ambulatory Visit | Attending: Oncology | Admitting: Oncology

## 2017-01-10 DIAGNOSIS — C7951 Secondary malignant neoplasm of bone: Secondary | ICD-10-CM | POA: Insufficient documentation

## 2017-01-10 DIAGNOSIS — C61 Malignant neoplasm of prostate: Secondary | ICD-10-CM

## 2017-01-10 LAB — CBC WITH DIFFERENTIAL/PLATELET
BASO%: 1.2 % (ref 0.0–2.0)
Basophils Absolute: 0.1 10*3/uL (ref 0.0–0.1)
EOS%: 2.1 % (ref 0.0–7.0)
Eosinophils Absolute: 0.1 10*3/uL (ref 0.0–0.5)
HCT: 44.8 % (ref 38.4–49.9)
HGB: 15.1 g/dL (ref 13.0–17.1)
LYMPH#: 1.3 10*3/uL (ref 0.9–3.3)
LYMPH%: 20.7 % (ref 14.0–49.0)
MCH: 33.5 pg — ABNORMAL HIGH (ref 27.2–33.4)
MCHC: 33.8 g/dL (ref 32.0–36.0)
MCV: 99.2 fL — ABNORMAL HIGH (ref 79.3–98.0)
MONO#: 0.6 10*3/uL (ref 0.1–0.9)
MONO%: 9.5 % (ref 0.0–14.0)
NEUT%: 66.5 % (ref 39.0–75.0)
NEUTROS ABS: 4.2 10*3/uL (ref 1.5–6.5)
Platelets: 184 10*3/uL (ref 140–400)
RBC: 4.51 10*6/uL (ref 4.20–5.82)
RDW: 15.2 % — AB (ref 11.0–14.6)
WBC: 6.3 10*3/uL (ref 4.0–10.3)

## 2017-01-10 LAB — COMPREHENSIVE METABOLIC PANEL
ALT: 33 U/L (ref 0–55)
AST: 42 U/L — AB (ref 5–34)
Albumin: 3.6 g/dL (ref 3.5–5.0)
Alkaline Phosphatase: 103 U/L (ref 40–150)
Anion Gap: 11 mEq/L (ref 3–11)
BILIRUBIN TOTAL: 0.96 mg/dL (ref 0.20–1.20)
BUN: 16 mg/dL (ref 7.0–26.0)
CALCIUM: 9.3 mg/dL (ref 8.4–10.4)
CHLORIDE: 103 meq/L (ref 98–109)
CO2: 22 meq/L (ref 22–29)
CREATININE: 0.9 mg/dL (ref 0.7–1.3)
EGFR: 83 mL/min/{1.73_m2} — ABNORMAL LOW (ref >90)
Glucose: 98 mg/dl (ref 70–140)
Potassium: 4.1 mEq/L (ref 3.5–5.1)
Sodium: 137 mEq/L (ref 136–145)
TOTAL PROTEIN: 6.7 g/dL (ref 6.4–8.3)

## 2017-01-10 MED ORDER — TECHNETIUM TC 99M MEDRONATE IV KIT
21.0000 | PACK | Freq: Once | INTRAVENOUS | Status: AC | PRN
  Administered 2017-01-10: 21 via INTRAVENOUS

## 2017-01-11 ENCOUNTER — Ambulatory Visit (HOSPITAL_BASED_OUTPATIENT_CLINIC_OR_DEPARTMENT_OTHER): Payer: Medicare Other | Admitting: Oncology

## 2017-01-11 ENCOUNTER — Ambulatory Visit (HOSPITAL_BASED_OUTPATIENT_CLINIC_OR_DEPARTMENT_OTHER): Payer: Medicare Other

## 2017-01-11 ENCOUNTER — Telehealth: Payer: Self-pay | Admitting: Oncology

## 2017-01-11 VITALS — BP 126/66 | HR 88 | Temp 97.7°F | Resp 18 | Ht 68.0 in | Wt 167.4 lb

## 2017-01-11 DIAGNOSIS — C61 Malignant neoplasm of prostate: Secondary | ICD-10-CM

## 2017-01-11 DIAGNOSIS — E291 Testicular hypofunction: Secondary | ICD-10-CM

## 2017-01-11 DIAGNOSIS — C7951 Secondary malignant neoplasm of bone: Secondary | ICD-10-CM | POA: Diagnosis not present

## 2017-01-11 LAB — PSA: PROSTATE SPECIFIC AG, SERUM: 3.9 ng/mL (ref 0.0–4.0)

## 2017-01-11 MED ORDER — ENZALUTAMIDE 40 MG PO CAPS: 160.0000 mg | ORAL_CAPSULE | Freq: Every day | ORAL | 0 refills | Status: DC

## 2017-01-11 MED ORDER — DENOSUMAB 120 MG/1.7ML ~~LOC~~ SOLN
120.0000 mg | Freq: Once | SUBCUTANEOUS | Status: AC
  Administered 2017-01-11: 120 mg via SUBCUTANEOUS
  Filled 2017-01-11: qty 1.7

## 2017-01-11 NOTE — Telephone Encounter (Signed)
Scheduled appt per 8/9 los - Gave patient AVS and calender per los.  

## 2017-01-11 NOTE — Patient Instructions (Signed)
Denosumab injection  What is this medicine?  DENOSUMAB (den oh sue mab) slows bone breakdown. Prolia is used to treat osteoporosis in women after menopause and in men. Xgeva is used to prevent bone fractures and other bone problems caused by cancer bone metastases. Xgeva is also used to treat giant cell tumor of the bone.  This medicine may be used for other purposes; ask your health care provider or pharmacist if you have questions.  What should I tell my health care provider before I take this medicine?  They need to know if you have any of these conditions:  -dental disease  -eczema  -infection or history of infections  -kidney disease or on dialysis  -low blood calcium or vitamin D  -malabsorption syndrome  -scheduled to have surgery or tooth extraction  -taking medicine that contains denosumab  -thyroid or parathyroid disease  -an unusual reaction to denosumab, other medicines, foods, dyes, or preservatives  -pregnant or trying to get pregnant  -breast-feeding  How should I use this medicine?  This medicine is for injection under the skin. It is given by a health care professional in a hospital or clinic setting.  If you are getting Prolia, a special MedGuide will be given to you by the pharmacist with each prescription and refill. Be sure to read this information carefully each time.  For Prolia, talk to your pediatrician regarding the use of this medicine in children. Special care may be needed. For Xgeva, talk to your pediatrician regarding the use of this medicine in children. While this drug may be prescribed for children as young as 13 years for selected conditions, precautions do apply.  Overdosage: If you think you have taken too much of this medicine contact a poison control center or emergency room at once.  NOTE: This medicine is only for you. Do not share this medicine with others.  What if I miss a dose?  It is important not to miss your dose. Call your doctor or health care professional if you are  unable to keep an appointment.  What may interact with this medicine?  Do not take this medicine with any of the following medications:  -other medicines containing denosumab  This medicine may also interact with the following medications:  -medicines that suppress the immune system  -medicines that treat cancer  -steroid medicines like prednisone or cortisone  This list may not describe all possible interactions. Give your health care provider a list of all the medicines, herbs, non-prescription drugs, or dietary supplements you use. Also tell them if you smoke, drink alcohol, or use illegal drugs. Some items may interact with your medicine.  What should I watch for while using this medicine?  Visit your doctor or health care professional for regular checks on your progress. Your doctor or health care professional may order blood tests and other tests to see how you are doing.  Call your doctor or health care professional if you get a cold or other infection while receiving this medicine. Do not treat yourself. This medicine may decrease your body's ability to fight infection.  You should make sure you get enough calcium and vitamin D while you are taking this medicine, unless your doctor tells you not to. Discuss the foods you eat and the vitamins you take with your health care professional.  See your dentist regularly. Brush and floss your teeth as directed. Before you have any dental work done, tell your dentist you are receiving this medicine.  Do   not become pregnant while taking this medicine or for 5 months after stopping it. Women should inform their doctor if they wish to become pregnant or think they might be pregnant. There is a potential for serious side effects to an unborn child. Talk to your health care professional or pharmacist for more information.  What side effects may I notice from receiving this medicine?  Side effects that you should report to your doctor or health care professional as soon as  possible:  -allergic reactions like skin rash, itching or hives, swelling of the face, lips, or tongue  -breathing problems  -chest pain  -fast, irregular heartbeat  -feeling faint or lightheaded, falls  -fever, chills, or any other sign of infection  -muscle spasms, tightening, or twitches  -numbness or tingling  -skin blisters or bumps, or is dry, peels, or red  -slow healing or unexplained pain in the mouth or jaw  -unusual bleeding or bruising  Side effects that usually do not require medical attention (Report these to your doctor or health care professional if they continue or are bothersome.):  -muscle pain  -stomach upset, gas  This list may not describe all possible side effects. Call your doctor for medical advice about side effects. You may report side effects to FDA at 1-800-FDA-1088.  Where should I keep my medicine?  This medicine is only given in a clinic, doctor's office, or other health care setting and will not be stored at home.  NOTE: This sheet is a summary. It may not cover all possible information. If you have questions about this medicine, talk to your doctor, pharmacist, or health care provider.      2016, [employer]vier/Gold Standard. (2011-11-20 12:37:47)

## 2017-01-11 NOTE — Progress Notes (Signed)
Hematology and Oncology Follow Up Visit  Lucas Holloway [id number] 23-Dec-1940 76 y.o. 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 He was initially diagnosed in 2009 without Gleason score 6, stage TIc and a PSA of around 9.   Prior Therapy: He underwent seed implant brachytherapy with excellent PSA response initially. His PSA nadir was 0.7 and 2013.  His PSA did go up to 1.2 in October 2016 however. His staging workup at that time including a bone scan which showed metastatic bony disease.  He was started on androgen deprivation under the care of Dr. [name] and his PSA did drop down initially to 0.61 in May 2017.  On 12/02/2015 his PSA was 1.44 with a testosterone level of 32. A repeat a bone scan obtained on 12/13/2015 showed progression of disease with increased uptake in the anterior and posterior ribs, sternum and right humerus  Current therapy:  Zytiga 1000 mg daily with prednisone 5 mg daily started on 01/07/2016. He is receiving Xgeva every 6 weeks. He is currently on Lupron every 3 months.   Interim History: [name] presents today for a follow-up visit. Since the last visit, he reports no major changes in his health. He remains active and continues to attempt activities of daily living. He continues to tolerate Zytiga without any major complications. He denied lower extremity edema, nausea, pruritus or other GI toxicities. His appetite remains about the same and weight is stable. He denies any recurrence of his arthralgias or myalgias. He denied any pathological fractures or bone pain. He denied any recent hospitalizations or illnesses. His quality of life remains excellent.  He does not report any headaches, blurry vision, syncope or seizures. He does not report any fevers or chills or sweats. He does not report any cough, wheezing or hemoptysis. He  does not report any nausea, vomiting or abdominal pain. He does not report any frequency urgency or hesitancy. He does not report any skeletal complaints of arthralgias myalgias. He does not report any lymphadenopathy or petechiae. Remaining review of systems unremarkable.   Medications: I have reviewed the patient's current medications.  Current Outpatient Prescriptions  Medication Sig Dispense Refill  . calcium-vitamin D (OSCAL WITH D) 500-200 MG-UNIT per tablet Take 1 tablet by mouth 2 (two) times daily.    . Degarelix Acetate (FIRMAGON [place]) One injection a month    . Denosumab (XGEVA [place]) One injection a month    . enzalutamide (XTANDI) 40 MG capsule Take 4 capsules (160 mg total) by mouth daily. 120 capsule 0  . fish oil-omega-3 fatty acids 1000 MG capsule Take 1 g by mouth daily.     . folic acid (FOLVITE) 1 MG tablet Take 1 mg by mouth daily.    Marland Kitchen levothyroxine (SYNTHROID, LEVOTHROID) 75 MCG tablet Take 75 mcg by mouth daily before breakfast.   3  . meloxicam (MOBIC) 7.5 MG tablet Take 7.5 mg by mouth daily.    . methotrexate (RHEUMATREX) 2.5 MG tablet Take 15 mg by mouth once a week. Takes on sundays.Marland KitchenMarland KitchenCaution:Chemotherapy. Protect from light.    . Multiple Vitamin (MULTIVITAMIN WITH MINERALS) TABS Take 1 tablet by mouth daily.    . simvastatin (ZOCOR) 20 MG tablet Take 1 tablet by mouth daily at 2 PM.  3  . Vitamin D, Ergocalciferol, (DRISDOL) 50000 units CAPS capsule Take 50,000 Units by mouth daily.    . vitamin E 400 UNIT  capsule Take 800 Units by mouth daily.      No current facility-administered medications for this visit.      Allergies: No Known Allergies  Past Medical History, Surgical history, Social history, and Family History were reviewed and updated.  Physical Exam: Blood pressure 126/66, pulse 88, temperature 97.7 F (36.5 C), temperature source Oral, resp. rate 18, height 5\' 8"  (1.727 m), weight 167 lb 6.4 oz (75.9 kg), SpO2 97 %. ECOG: 0 General appearance:  Alert, awake gentleman without distress. Head: Normocephalic, without obvious abnormality no oral thrush or ulcers. Neck: no adenopathy Lymph nodes: Cervical, supraclavicular, and axillary nodes normal. Heart:regular rate and rhythm, S1, S2 normal, no murmur, click, rub or gallop Lung:chest clear, no wheezing, rales, normal symmetric air entry Abdomin: soft, non-tender, without masses or organomegaly no rebound or guarding. EXT:no edema noted.   Lab Results: Lab Results  Component Value Date   WBC 6.3 01/10/2017   HGB 15.1 01/10/2017   HCT 44.8 01/10/2017   MCV 99.2 (H) 01/10/2017   PLT 184 01/10/2017     Chemistry      Component Value Date/Time   NA 137 01/10/2017 0808   K 4.1 01/10/2017 0808   CO2 22 01/10/2017 0808   BUN 16.0 01/10/2017 0808   CREATININE 0.9 01/10/2017 0808      Component Value Date/Time   CALCIUM 9.3 01/10/2017 0808   ALKPHOS 103 01/10/2017 0808   AST 42 (H) 01/10/2017 0808   ALT 33 01/10/2017 0808   BILITOT 0.96 01/10/2017 0808      Results for JOSAFAT, ENRICO (MRN [id number]) as of 01/11/2017 08:44  Ref. Range 09/27/2016 13:04 11/09/2016 12:52 01/10/2017 08:08  Prostate Specific Ag, Serum Latest Ref Range: 0.0 - 4.0 ng/mL 1.6 2.2 3.9   EXAM: NUCLEAR MEDICINE WHOLE BODY BONE SCAN  TECHNIQUE: Whole body anterior and posterior images were obtained approximately 3 hours after intravenous injection of radiopharmaceutical.  RADIOPHARMACEUTICALS:  21 mCi Technetium-65m MDP IV  COMPARISON:  12/13/2015  Radiographic correlation:  None since prior bone scan  FINDINGS: Multiple abnormal sites of increased osseous tracer accumulation again identified compatible with osseous metastatic disease.  These include the sternum, proximal BILATERAL humeri, anterior and posterior ribs bilaterally, thoracic and lumbar spine, pelvis, and BILATERAL proximal femora.  Foci of uptake at the proximal LEFT humerus, mid LEFT femoral diaphysis, and the  intertrochanteric region of the RIGHT femur appear new, as do additional lesions in an anterior lower RIGHT rib and bilaterally in the pelvis.  Uptake at the sternum appears to have increased in size.  Expected urinary tract and soft tissue distribution of tracer.  IMPRESSION: Progressive osseous metastatic disease as above.     Impression and Plan:  77 year old gentleman with the following issues:  1. Prostate cancer initially diagnosed in 2009 without Gleason score 6, stage TIc and a PSA of around 9. He was treated with brachytherapy utilizing seed implants and developed recurrent disease in October 2016. He developed a bony metastasis and treated with androgen deprivation.   He developed castration resistant disease in June 2017 with progressive bony metastasis.  He started Zytiga on 01/07/2016 and tolerated it well. PSA had an excellent response initially with a drop from 2.3 to 0.9. His PSA started to rise with a doubling time of less than 6 months with PSA on August 8 was 3.9. His bone scan obtained on the same day was personally reviewed today and reviewed with the patient. He appears to have developed progression of disease.  Options  of therapy were reviewed today with the patient and his wife. These options would include systemic chemotherapy, Xofigo or Xtandi. Given the fact that he got exposed to [place], the benefit from [place] could be marginal but it is possible. After discussion today, he is now willing to proceed with chemotherapy at this time. Trudi Ida would be a good option for him but he will be traveling for 3 months to [place] in December and would like to defer that option potentially in March 2019.  Risks and benefits associated with Gillermina Phy were reviewed today. Complications include nausea, fatigue, edema, hematuria and rarely seizures were reviewed. He is willing to try this medication at least for the time being. He will finish his current supply of Zytiga which  will take him to the end of August. He will start [place] beginning of September.    2. Bone directed therapy: He is currently receiving Xgeva . He is on calcium and vitamin D supplements. He already obtained dental clearance prior to Xgeva start in 2016. He will receive this injection today and be repeated every visit. His next injection will be in September 2018.  3. Androgen depravation: I recommended to continue therapy indefinitely. He will receive Lupron on 08/04/2016 and will be repeated in June 2018. This will be repeated every 3 months at a dose of 22.5 mg. his next injection will be in September 2018.  4. Liver function test and electrolytes surveillance: Within normal range in March 2018.  5. Follow-up: September 2018 to follow his progress.    [facility], MD 8/9/20188:42 AM

## 2017-01-15 ENCOUNTER — Telehealth: Payer: Self-pay | Admitting: Pharmacy Technician

## 2017-01-15 NOTE — Telephone Encounter (Signed)
Oral Oncology Patient Advocate Encounter  Prior Authorization for Lucas Holloway was submitted and has been approved.    PA# [id number]  Effective dates: 01-15-17 through 06-04-17.   Oral Oncology Clinic will continue to follow.   Fabio Asa. Melynda Keller, [place] Patient [place] [phone] 01/15/2017 9:39 AM

## 2017-01-22 ENCOUNTER — Telehealth: Payer: Self-pay | Admitting: Pharmacy Technician

## 2017-01-22 ENCOUNTER — Telehealth: Payer: Self-pay | Admitting: Pharmacist

## 2017-01-22 DIAGNOSIS — C61 Malignant neoplasm of prostate: Secondary | ICD-10-CM

## 2017-01-22 MED ORDER — ENZALUTAMIDE 40 MG PO CAPS: 160.0000 mg | ORAL_CAPSULE | Freq: Every day | ORAL | 0 refills | Status: DC

## 2017-01-22 NOTE — Telephone Encounter (Signed)
Oral Oncology Patient Advocate Encounter  Met patient in [place] to complete application for [employer] in an effort to reduce patient's out of pocket expense for Xtandi to $0.    Application completed and faxed to [phone].   [place] phone number for follow up is [phone].   This encounter will be updated until final determination.   Fabio Asa. Melynda Keller, [place] Patient [facility] [phone] 01/22/2017 4:07 PM

## 2017-01-22 NOTE — Telephone Encounter (Signed)
Oral Oncology Pharmacist Encounter  Received new prescription for [facility] for the treatment of metastatic, castration-resistant prostate cancer that has progressed on Zytiga therapy, planned duration until disease progression or unacceptable toxicity.  Labs from 01/10/17 assessed, OK for treatment. BPs in Epic reviewed, WNL, will continue to be monitored  Current medication list in Epic reviewed, DDIs with Gillermina Phy identified:  Xtandi and Mobic: category D interaction: Gillermina Phy is a CYP2C9 inducer and will decrease systemic exposure to Mobic  Xtandi and Zocor: category D interaction: Gillermina Phy is a CYP3A4 inducer and will decrease systemic exposure to Zocor  Prescription has been e-scribed to the [employer] for benefits analysis and approval. Copayment $556.24, Oral oncology Patient Advocate applying for manufacturer assistance for patient.   I spoke with patient in [facility] lobby for overview of Xtandi.   Counseled patient on administration, dosing, side effects, safe handling, and monitoring. Patient will take Xtandi 40mg  capsule, 4 capsules (160mg ) by mouth once daily without regard to food. Patient will continue taking Zytiga 250mg  tablets, 4 tablets (1000mg ) by mouth once daily on an empty stomach along with prednisone 5mg  tablet, 1 tablet once daily, until he finishes current Zytiga supply. He will discontinue prednisone when he discontinues Zytiga. Hopefully we will have Xtandi in patient's hand at that point.  Side effects include but not limited to: fatigue, hot flashes, GI upset, LE edema, hypertension, and arthralgias. Patient currently experiencing daily hot flashes that last 1-3 minutes in duration.  Patient counseled he may also experience them with [facility] therapy   Patient made aware of DDIs as above. Patient counseled to let us know if he experiences increased pain due to lack of Mobic effect, and that his cholesterol medication may have to be increased or changed  if it becomes uncontrolled due to [place] usage.  Reviewed with patient importance of keeping a medication schedule and plan for any missed doses.  Mr. Swallows voiced understanding and appreciation.   All questions answered.  Will follow up with patient regarding start date of Xtandi.   Patient knows to call the office with questions or concerns. Oral Oncology Clinic will continue to follow.  Thank you,  Johny Drilling, PharmD, BCPS, BCOP 01/22/2017  1:05 PM Oral Oncology Clinic [phone]

## 2017-01-31 NOTE — Telephone Encounter (Signed)
Oral Oncology Patient Advocate Encounter  Received notification from [employer] that patient has been successfully enrolled into their program to receive Xtandi from the manufacturer at $0 out of pocket until 06/04/2017.   I called and spoke with patient. He knows we will have to re-apply for 2019.  He had already received a call from the [facility] to arrange shipment of his medication.  He is understanding and appreciative of the plan.     Patient knows to call the office with questions or concerns.  Gilmore Laroche, CPhT, [place] Oral Oncology Patient Advocate [phone] 01/31/2017 12:17 PM

## 2017-01-31 NOTE — Telephone Encounter (Signed)
Oral Chemotherapy Pharmacist Encounter  Received call from patient for update on [place] applications. Application still pending.  Patient to complete current bottle of Zytiga today and asking when he can start on Xtandi.  Patient counseled that he is no longer on effective treatment. If he wishes to start [facility] tomorrow he can pick-up a bottle at the pharmacy, however he will need to pay ~$550 copayment. He can also wait for final determination about free drug from the manufacturer, unknown date for determination.  Patient would like some advice from Dr. [name] about whether to pay for his medication or if it is OK to wait another few weeks for free medication. This will be discussed with MD.  Patient knows to call the office with questions or concerns. Oral Oncology Clinic will continue to follow.  Thank you,  Johny Drilling, PharmD, BCPS, BCOP 01/31/2017  8:49 AM Oral Oncology Clinic [phone]

## 2017-02-06 ENCOUNTER — Encounter: Payer: Self-pay | Admitting: *Deleted

## 2017-02-06 ENCOUNTER — Telehealth: Payer: Self-pay

## 2017-02-06 NOTE — Telephone Encounter (Signed)
Pt started on xtandi last Friday morning. By noon started headache, started vomiting. Took on Saturday and stomach was nauseated all day. On Sunday threw it up and saw the pill. Nausea diarrhea and headache all day. Did not take Monday and Tuesday. Vomiting is gone, nausea still present, headache still present. Diarrhea gone. He started eating this AM, he is nauseated still but able to keep it down. Fluids are staying down. coffee and milk.   Instructed pt to drink 5 - 16 oz bottles of water today. To eat easily digested food like BRAT diet.  To use tylenol for the headache.

## 2017-02-07 DIAGNOSIS — Z6824 Body mass index (BMI) 24.0-24.9, adult: Secondary | ICD-10-CM | POA: Diagnosis not present

## 2017-02-07 DIAGNOSIS — L4059 Other psoriatic arthropathy: Secondary | ICD-10-CM | POA: Diagnosis not present

## 2017-02-09 ENCOUNTER — Telehealth: Payer: Self-pay | Admitting: Oncology

## 2017-02-09 NOTE — Telephone Encounter (Signed)
sw pt to confirm 9/10 appt at 0930 per sch msg

## 2017-02-09 NOTE — Telephone Encounter (Addendum)
Pt is now eating well. Headache is gone, nausea is gone. His balance is still off where his is holding onto things or walking slow. He had been dizzy but that is cleared. They did not mention this on 9/4.  What is the plan? Wait until 9/25 with next appt? He does not really want to try the xtandi again.   Gone next Thurs thru Sunday. (13-16)

## 2017-02-12 ENCOUNTER — Ambulatory Visit (HOSPITAL_BASED_OUTPATIENT_CLINIC_OR_DEPARTMENT_OTHER): Payer: Medicare Other | Admitting: Oncology

## 2017-02-12 ENCOUNTER — Other Ambulatory Visit: Payer: Self-pay | Admitting: Oncology

## 2017-02-12 VITALS — BP 123/64 | HR 71 | Temp 97.6°F | Resp 18 | Ht 68.0 in | Wt 169.2 lb

## 2017-02-12 DIAGNOSIS — C7951 Secondary malignant neoplasm of bone: Secondary | ICD-10-CM | POA: Diagnosis not present

## 2017-02-12 DIAGNOSIS — C61 Malignant neoplasm of prostate: Secondary | ICD-10-CM

## 2017-02-12 DIAGNOSIS — E291 Testicular hypofunction: Secondary | ICD-10-CM | POA: Diagnosis not present

## 2017-02-12 MED ORDER — PROCHLORPERAZINE MALEATE 10 MG PO TABS: 10.0000 mg | ORAL_TABLET | Freq: Four times a day (QID) | ORAL | 0 refills | Status: DC | PRN

## 2017-02-12 NOTE — Progress Notes (Signed)
Hematology and Oncology Follow Up Visit  Lucas Holloway [id number] 07-25-1940 76 y.o. 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 He was initially diagnosed in 2009 without Gleason score 6, stage TIc and a PSA of around 9.   Prior Therapy: He underwent seed implant brachytherapy with excellent PSA response initially. His PSA nadir was 0.7 and 2013.  His PSA did go up to 1.2 in October 2016 however. His staging workup at that time including a bone scan which showed metastatic bony disease.  He was started on androgen deprivation under the care of Dr. [name] and his PSA did drop down initially to 0.61 in May 2017.  On 12/02/2015 his PSA was 1.44 with a testosterone level of 32. A repeat a bone scan obtained on 12/13/2015 showed progression of disease with increased uptake in the anterior and posterior ribs, sternum and right humerus Zytiga 1000 mg daily with prednisone 5 mg daily started on 01/07/2016.  Current therapy:  Xtandi 160 mg daily started in August 2018. The dose was reduced to 80 mg daily starting on 02/12/2017 for better tolerance. He is receiving Xgeva every 6 weeks. He is currently on Lupron every 3 months.   Interim History: Lucas Holloway presents today for a follow-up visit. Since the last visit, he started Xtandi and tolerated it poorly. He developed symptoms of headaches, fatigue and had 1 episode of vomiting. He took it for 3 days and stop taking it for the last few days. After stopping it, his symptoms resolved. He is asymptomatic at this time. He did not report feeling ill before that. He remains active attending to her activities of daily living.  He does not report any headaches, blurry vision, syncope or seizures. He does not report any fevers or chills or sweats. He does not report any cough, wheezing or hemoptysis. He does not report any  nausea, vomiting or abdominal pain. He does not report any frequency urgency or hesitancy. He does not report any skeletal complaints of arthralgias myalgias. He does not report any lymphadenopathy or petechiae. Remaining review of systems unremarkable.   Medications: I have reviewed the patient's current medications.  Current Outpatient Prescriptions  Medication Sig Dispense Refill  . calcium-vitamin D (OSCAL WITH D) 500-200 MG-UNIT per tablet Take 1 tablet by mouth 2 (two) times daily.    . Degarelix Acetate (FIRMAGON [place]) One injection a month    . Denosumab (XGEVA [place]) One injection a month    . enzalutamide (XTANDI) 40 MG capsule Take 4 capsules (160 mg total) by mouth daily. 120 capsule 0  . fish oil-omega-3 fatty acids 1000 MG capsule Take 1 g by mouth daily.     . folic acid (FOLVITE) 1 MG tablet Take 1 mg by mouth daily.    Marland Kitchen levothyroxine (SYNTHROID, LEVOTHROID) 75 MCG tablet Take 75 mcg by mouth daily before breakfast.   3  . meloxicam (MOBIC) 7.5 MG tablet Take 7.5 mg by mouth daily.    . methotrexate (RHEUMATREX) 2.5 MG tablet Take 15 mg by mouth once a week. Takes on sundays.Marland KitchenMarland KitchenCaution:Chemotherapy. Protect from light.    . Multiple Vitamin (MULTIVITAMIN WITH MINERALS) TABS Take 1 tablet by mouth daily.    . simvastatin (ZOCOR) 20 MG tablet Take 1 tablet by mouth daily at 2 PM.  3  . Vitamin D, Ergocalciferol, (DRISDOL) 50000 units CAPS capsule Take 50,000 Units by mouth daily.    Marland Kitchen  vitamin E 400 UNIT capsule Take 800 Units by mouth daily.      No current facility-administered medications for this visit.      Allergies: No Known Allergies  Past Medical History, Surgical history, Social history, and Family History were reviewed and updated.  Physical Exam: Blood pressure 123/64, pulse 71, temperature 97.6 F (36.4 C), temperature source Oral, resp. rate 18, height 5\' 8"  (1.727 m), weight 169 lb 3.2 oz (76.7 kg), SpO2 97 %. ECOG: 0 General appearance: Well-appearing gentleman  without distress. Head: Normocephalic, without obvious abnormality no oral ulcers. Neck: no adenopathy no neck masses. Lymph nodes: Cervical, supraclavicular, and axillary nodes normal. Heart:regular rate and rhythm, S1, S2 normal, no murmur, click, rub or gallop Lung:chest clear, no wheezing, rales, normal symmetric air entry Abdomin: soft, non-tender, without masses or organomegaly no shifting dullness or ascites. EXT:no edema noted.   Lab Results: Lab Results  Component Value Date   WBC 6.3 01/10/2017   HGB 15.1 01/10/2017   HCT 44.8 01/10/2017   MCV 99.2 (H) 01/10/2017   PLT 184 01/10/2017     Chemistry      Component Value Date/Time   NA 137 01/10/2017 0808   K 4.1 01/10/2017 0808   CO2 22 01/10/2017 0808   BUN 16.0 01/10/2017 0808   CREATININE 0.9 01/10/2017 0808      Component Value Date/Time   CALCIUM 9.3 01/10/2017 0808   ALKPHOS 103 01/10/2017 0808   AST 42 (H) 01/10/2017 0808   ALT 33 01/10/2017 0808   BILITOT 0.96 01/10/2017 0808          Impression and Plan:  76 year old gentleman with the following issues:  1. Prostate cancer initially diagnosed in 2009 without Gleason score 6, stage TIc and a PSA of around 9. He was treated with brachytherapy utilizing seed implants and developed recurrent disease in October 2016. He developed a bony metastasis and treated with androgen deprivation.   He developed castration resistant disease in June 2017 with progressive bony metastasis.  He S/P Zytiga on 01/07/2016 to August 2018. Therapy discontinued because of progression of disease.   He is currently on Xtandi and tolerated the 160 mg dose poorly. The plan is to reduce his dose to 80 mg daily and follow his progress. If he continues to tolerate this lower dose poorly, we will consider alternative therapy. I will be in the form of chemotherapy or Xofigo.    2. Bone directed therapy: He is currently receiving Xgeva . He is on calcium and vitamin D supplements.  He already obtained dental clearance prior to Xgeva start in 2016. He will receive this injection today and be repeated every visit. His next injection will be in September 2018.  3. Androgen depravation: I recommended to continue therapy indefinitely. He will receive Lupron on 08/04/2016 and will be repeated in June 2018. This will be repeated every 3 months at a dose of 22.5 mg. his next injection will be in September 2018.  4. Nausea: I will prescribe Compazine for him to use as needed in case he develops nausea.  5. Follow-up: September 25th as scheduled.    Zola Button, MD 9/10/201810:06 AM

## 2017-02-14 ENCOUNTER — Telehealth: Payer: Self-pay | Admitting: Oncology

## 2017-02-14 DIAGNOSIS — Z6826 Body mass index (BMI) 26.0-26.9, adult: Secondary | ICD-10-CM | POA: Diagnosis not present

## 2017-02-14 DIAGNOSIS — E039 Hypothyroidism, unspecified: Secondary | ICD-10-CM | POA: Diagnosis not present

## 2017-02-14 DIAGNOSIS — Z23 Encounter for immunization: Secondary | ICD-10-CM | POA: Diagnosis not present

## 2017-02-14 DIAGNOSIS — E782 Mixed hyperlipidemia: Secondary | ICD-10-CM | POA: Diagnosis not present

## 2017-02-14 DIAGNOSIS — C61 Malignant neoplasm of prostate: Secondary | ICD-10-CM | POA: Diagnosis not present

## 2017-02-14 NOTE — Telephone Encounter (Signed)
Per 9/11 - there is no 9/10 los °

## 2017-02-27 ENCOUNTER — Telehealth: Payer: Self-pay | Admitting: Oncology

## 2017-02-27 ENCOUNTER — Ambulatory Visit (HOSPITAL_BASED_OUTPATIENT_CLINIC_OR_DEPARTMENT_OTHER): Payer: Medicare Other

## 2017-02-27 ENCOUNTER — Other Ambulatory Visit (HOSPITAL_BASED_OUTPATIENT_CLINIC_OR_DEPARTMENT_OTHER): Payer: Medicare Other

## 2017-02-27 ENCOUNTER — Ambulatory Visit (HOSPITAL_BASED_OUTPATIENT_CLINIC_OR_DEPARTMENT_OTHER): Payer: Medicare Other | Admitting: Oncology

## 2017-02-27 VITALS — BP 129/66 | HR 69 | Temp 97.8°F | Resp 17 | Ht 68.0 in | Wt 165.8 lb

## 2017-02-27 DIAGNOSIS — E291 Testicular hypofunction: Secondary | ICD-10-CM

## 2017-02-27 DIAGNOSIS — Z5111 Encounter for antineoplastic chemotherapy: Secondary | ICD-10-CM | POA: Diagnosis present

## 2017-02-27 DIAGNOSIS — R110 Nausea: Secondary | ICD-10-CM | POA: Diagnosis not present

## 2017-02-27 DIAGNOSIS — C61 Malignant neoplasm of prostate: Secondary | ICD-10-CM | POA: Diagnosis not present

## 2017-02-27 DIAGNOSIS — C7951 Secondary malignant neoplasm of bone: Secondary | ICD-10-CM | POA: Diagnosis not present

## 2017-02-27 LAB — COMPREHENSIVE METABOLIC PANEL
ALT: 26 U/L (ref 0–55)
ANION GAP: 6 meq/L (ref 3–11)
AST: 42 U/L — ABNORMAL HIGH (ref 5–34)
Albumin: 3.5 g/dL (ref 3.5–5.0)
Alkaline Phosphatase: 100 U/L (ref 40–150)
BILIRUBIN TOTAL: 0.67 mg/dL (ref 0.20–1.20)
BUN: 10.5 mg/dL (ref 7.0–26.0)
CO2: 23 meq/L (ref 22–29)
Calcium: 9.2 mg/dL (ref 8.4–10.4)
Chloride: 93 mEq/L — ABNORMAL LOW (ref 98–109)
Creatinine: 0.7 mg/dL (ref 0.7–1.3)
Glucose: 99 mg/dl (ref 70–140)
POTASSIUM: 4.3 meq/L (ref 3.5–5.1)
Sodium: 122 mEq/L — ABNORMAL LOW (ref 136–145)
TOTAL PROTEIN: 6.5 g/dL (ref 6.4–8.3)

## 2017-02-27 LAB — CBC WITH DIFFERENTIAL/PLATELET
BASO%: 0.5 % (ref 0.0–2.0)
BASOS ABS: 0 10*3/uL (ref 0.0–0.1)
EOS ABS: 0.1 10*3/uL (ref 0.0–0.5)
EOS%: 2.6 % (ref 0.0–7.0)
HCT: 39.5 % (ref 38.4–49.9)
HGB: 14 g/dL (ref 13.0–17.1)
LYMPH%: 21.7 % (ref 14.0–49.0)
MCH: 32.9 pg (ref 27.2–33.4)
MCHC: 35.4 g/dL (ref 32.0–36.0)
MCV: 92.7 fL (ref 79.3–98.0)
MONO#: 0.5 10*3/uL (ref 0.1–0.9)
MONO%: 12 % (ref 0.0–14.0)
NEUT%: 63.2 % (ref 39.0–75.0)
NEUTROS ABS: 2.4 10*3/uL (ref 1.5–6.5)
PLATELETS: 197 10*3/uL (ref 140–400)
RBC: 4.26 10*6/uL (ref 4.20–5.82)
RDW: 13.4 % (ref 11.0–14.6)
WBC: 3.8 10*3/uL — ABNORMAL LOW (ref 4.0–10.3)
lymph#: 0.8 10*3/uL — ABNORMAL LOW (ref 0.9–3.3)

## 2017-02-27 MED ORDER — LEUPROLIDE ACETATE (4 MONTH) 30 MG IM KIT
22.5000 mg | PACK | Freq: Once | INTRAMUSCULAR | Status: AC
  Administered 2017-02-27: 22.5 mg via INTRAMUSCULAR
  Filled 2017-02-27: qty 30

## 2017-02-27 MED ORDER — DENOSUMAB 120 MG/1.7ML ~~LOC~~ SOLN
120.0000 mg | Freq: Once | SUBCUTANEOUS | Status: AC
  Administered 2017-02-27: 120 mg via SUBCUTANEOUS
  Filled 2017-02-27: qty 1.7

## 2017-02-27 NOTE — Telephone Encounter (Signed)
Gave avs and calendar for October  °

## 2017-02-27 NOTE — Progress Notes (Signed)
Hematology and Oncology Follow Up Visit  Lucas Holloway [id number] 1940-09-22 76 y.o. 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 He was initially diagnosed in 2009 without Gleason score 6, stage TIc and a PSA of around 9.   Prior Therapy: He underwent seed implant brachytherapy with excellent PSA response initially. His PSA nadir was 0.7 and 2013.  His PSA did go up to 1.2 in October 2016 however. His staging workup at that time including a bone scan which showed metastatic bony disease.  He was started on androgen deprivation under the care of Dr. [name] and his PSA did drop down initially to 0.61 in May 2017.  On 12/02/2015 his PSA was 1.44 with a testosterone level of 32. A repeat a bone scan obtained on 12/13/2015 showed progression of disease with increased uptake in the anterior and posterior ribs, sternum and right humerus Zytiga 1000 mg daily with prednisone 5 mg daily started on 01/07/2016.  Current therapy:  Xtandi 160 mg daily started in August 2018. The dose was reduced to 80 mg daily starting on 02/12/2017 for better tolerance. He is receiving Xgeva every 6 weeks. He is currently on Lupron every 3 months.   Interim History: [name] presents today for a follow-up visit. Since the last visit, he has been taking Xtandi at a lower dose and has been better tolerated. He denied any nausea, vomiting or syncope. He does report some mild fatigue and arthralgias. He remains active attending to his activities of daily living. He denied any bone pain or pathological fractures. He denied any hospitalizations or illnesses.  He does not report any headaches, blurry vision, syncope or seizures. He does not report any fevers or chills or sweats. He does not report any cough, wheezing or hemoptysis. He does not report any nausea, vomiting or abdominal pain. He  does not report any frequency urgency or hesitancy. He does not report any skeletal complaints of arthralgias myalgias. He does not report any lymphadenopathy or petechiae. Remaining review of systems unremarkable.   Medications: I have reviewed the patient's current medications.  Current Outpatient Prescriptions  Medication Sig Dispense Refill  . calcium-vitamin D (OSCAL WITH D) 500-200 MG-UNIT per tablet Take 1 tablet by mouth 2 (two) times daily.    . Degarelix Acetate (FIRMAGON [place]) One injection a month    . Denosumab (XGEVA ) One injection a month    . enzalutamide (XTANDI) 40 MG capsule Take 4 capsules (160 mg total) by mouth daily. 120 capsule 0  . fish oil-omega-3 fatty acids 1000 MG capsule Take 1 g by mouth daily.     . folic acid (FOLVITE) 1 MG tablet Take 1 mg by mouth daily.    Marland Kitchen levothyroxine (SYNTHROID, LEVOTHROID) 75 MCG tablet Take 75 mcg by mouth daily before breakfast.   3  . meloxicam (MOBIC) 7.5 MG tablet Take 7.5 mg by mouth daily.    . methotrexate (RHEUMATREX) 2.5 MG tablet Take 15 mg by mouth once a week. Takes on sundays.Marland KitchenMarland KitchenCaution:Chemotherapy. Protect from light.    . Multiple Vitamin (MULTIVITAMIN WITH MINERALS) TABS Take 1 tablet by mouth daily.    . prochlorperazine (COMPAZINE) 10 MG tablet TAKE 1 TABLET(10 MG) BY MOUTH EVERY 6 HOURS AS NEEDED FOR NAUSEA OR VOMITING 385 tablet 0  . simvastatin (ZOCOR) 20 MG tablet Take 1 tablet by mouth daily at 2 PM.  3  . Vitamin  D, Ergocalciferol, (DRISDOL) 50000 units CAPS capsule Take 50,000 Units by mouth daily.    . vitamin E 400 UNIT capsule Take 800 Units by mouth daily.      No current facility-administered medications for this visit.      Allergies: No Known Allergies  Past Medical History, Surgical history, Social history, and Family History were reviewed and updated.  Physical Exam: Blood pressure 129/66, pulse 69, temperature 97.8 F (36.6 C), temperature source Oral, resp. rate 17, height 5\' 8"  (1.727 m),  weight 165 lb 12.8 oz (75.2 kg), SpO2 97 %. ECOG: 0 General appearance: Alert, awake gentleman without distress. Head: Normocephalic, without obvious abnormality no oral ulcers. Neck: no adenopathy no neck masses. Lymph nodes: Cervical, supraclavicular, and axillary nodes normal. Heart:regular rate and rhythm, S1, S2 normal, no murmur, click, rub or gallop Lung:chest clear, no wheezing, rales, normal symmetric air entry Abdomin: soft, non-tender, without masses or organomegaly no shifting dullness or ascites. EXT:no edema noted.   Lab Results: Lab Results  Component Value Date   WBC 3.8 (L) 02/27/2017   HGB 14.0 02/27/2017   HCT 39.5 02/27/2017   MCV 92.7 02/27/2017   PLT 197 02/27/2017     Chemistry      Component Value Date/Time   NA 122 (L) 02/27/2017 0857   K 4.3 02/27/2017 0857   CO2 23 02/27/2017 0857   BUN 10.5 02/27/2017 0857   CREATININE 0.7 02/27/2017 0857      Component Value Date/Time   CALCIUM 9.2 02/27/2017 0857   ALKPHOS 100 02/27/2017 0857   AST 42 (H) 02/27/2017 0857   ALT 26 02/27/2017 0857   BILITOT 0.67 02/27/2017 0857      Results for [name], [name] (MRN [id number]) as of 02/27/2017 09:50  Ref. Range 11/09/2016 12:52 01/10/2017 08:08  Prostate Specific Ag, Serum Latest Ref Range: 0.0 - 4.0 ng/mL 2.2 3.9      Impression and Plan:  76 year old gentleman with the following issues:  1. Prostate cancer initially diagnosed in 2009 without Gleason score 6, stage TIc and a PSA of around 9. He was treated with brachytherapy utilizing seed implants and developed recurrent disease in October 2016. He developed a bony metastasis and treated with androgen deprivation.   He developed castration resistant disease in June 2017 with progressive bony metastasis.  He S/P Zytiga on 01/07/2016 to August 2018. Therapy discontinued because of progression of disease.   He is currently on Xtandi 80 mg daily which she has tolerated better. The plan is to continue the same  dose and schedule and repeat CT scan of the abdomen and pelvis in the near future. His PSA starts to decline, we'll continue on the same treatment. His PSA continues to rise experiencing progression of disease, we will consider either systemic chemotherapy or Xofigo. This will be dictated by the results of his CT scan whether his visceral disease or not.  2. Bone directed therapy: He is currently receiving Xgeva . He is on calcium and vitamin D supplements. He already obtained dental clearance prior to Xgeva start in 2016. He will receive this injection today and will be repeated in 8 weeks.  3. Androgen depravation: I recommended to continue therapy indefinitely. He will receive Lupron on 02/27/2017 and will be repeated in 3 months.  4. Nausea: I will prescribe Compazine for him to use as needed in case he develops nausea.  5. Follow-up: In 4 weeks to follow his progress.    Zola Button, MD 9/25/20189:47 AM

## 2017-02-28 ENCOUNTER — Telehealth: Payer: Self-pay | Admitting: *Deleted

## 2017-02-28 LAB — PSA: PROSTATE SPECIFIC AG, SERUM: 2.4 ng/mL (ref 0.0–4.0)

## 2017-02-28 NOTE — Telephone Encounter (Signed)
Spoke with wife, gave results of last PSA 

## 2017-02-28 NOTE — Telephone Encounter (Signed)
-----   Message from Wyatt Portela, MD sent at 02/28/2017  8:04 AM EDT ----- Please let him know his PSA is down.

## 2017-03-02 ENCOUNTER — Ambulatory Visit: Payer: Medicare Other | Admitting: Oncology

## 2017-03-02 ENCOUNTER — Other Ambulatory Visit: Payer: Medicare Other

## 2017-03-02 ENCOUNTER — Ambulatory Visit: Payer: Medicare Other

## 2017-03-07 ENCOUNTER — Telehealth: Payer: Self-pay | Admitting: *Deleted

## 2017-03-07 NOTE — Telephone Encounter (Signed)
Spoke with wife Lucas Holloway. Patient's ankles are swollen. Instructed him to keep elevated as much as possible. If worsens, call back. Keep regularly scheduled appt with dr Marcelyn Ditty this month.

## 2017-03-27 ENCOUNTER — Ambulatory Visit (HOSPITAL_COMMUNITY)
Admission: RE | Admit: 2017-03-27 | Discharge: 2017-03-27 | Disposition: A | Discharge status: Home / Self Care | Payer: Medicare Other | Source: Ambulatory Visit | Attending: Oncology | Admitting: Oncology

## 2017-03-27 ENCOUNTER — Other Ambulatory Visit (HOSPITAL_BASED_OUTPATIENT_CLINIC_OR_DEPARTMENT_OTHER): Payer: Medicare Other

## 2017-03-27 DIAGNOSIS — K760 Fatty (change of) liver, not elsewhere classified: Secondary | ICD-10-CM | POA: Insufficient documentation

## 2017-03-27 DIAGNOSIS — I700 Atherosclerosis of aorta: Secondary | ICD-10-CM | POA: Insufficient documentation

## 2017-03-27 DIAGNOSIS — I2510 Atherosclerotic heart disease of native coronary artery without angina pectoris: Secondary | ICD-10-CM | POA: Insufficient documentation

## 2017-03-27 DIAGNOSIS — J90 Pleural effusion, not elsewhere classified: Secondary | ICD-10-CM | POA: Diagnosis not present

## 2017-03-27 DIAGNOSIS — R160 Hepatomegaly, not elsewhere classified: Secondary | ICD-10-CM | POA: Diagnosis not present

## 2017-03-27 DIAGNOSIS — C61 Malignant neoplasm of prostate: Secondary | ICD-10-CM | POA: Diagnosis not present

## 2017-03-27 DIAGNOSIS — N200 Calculus of kidney: Secondary | ICD-10-CM | POA: Diagnosis not present

## 2017-03-27 DIAGNOSIS — C7951 Secondary malignant neoplasm of bone: Secondary | ICD-10-CM | POA: Diagnosis not present

## 2017-03-27 DIAGNOSIS — J9811 Atelectasis: Secondary | ICD-10-CM | POA: Diagnosis not present

## 2017-03-27 LAB — COMPREHENSIVE METABOLIC PANEL
ALBUMIN: 3.7 g/dL (ref 3.5–5.0)
ALK PHOS: 77 U/L (ref 40–150)
ALT: 24 U/L (ref 0–55)
ANION GAP: 9 meq/L (ref 3–11)
AST: 40 U/L — ABNORMAL HIGH (ref 5–34)
BILIRUBIN TOTAL: 0.71 mg/dL (ref 0.20–1.20)
BUN: 13.6 mg/dL (ref 7.0–26.0)
CO2: 22 meq/L (ref 22–29)
CREATININE: 0.8 mg/dL (ref 0.7–1.3)
Calcium: 9.1 mg/dL (ref 8.4–10.4)
Chloride: 95 mEq/L — ABNORMAL LOW (ref 98–109)
GLUCOSE: 116 mg/dL (ref 70–140)
Potassium: 4.3 mEq/L (ref 3.5–5.1)
Sodium: 126 mEq/L — ABNORMAL LOW (ref 136–145)
TOTAL PROTEIN: 6.8 g/dL (ref 6.4–8.3)

## 2017-03-27 LAB — CBC WITH DIFFERENTIAL/PLATELET
BASO%: 1.2 % (ref 0.0–2.0)
Basophils Absolute: 0.1 10*3/uL (ref 0.0–0.1)
EOS%: 3.4 % (ref 0.0–7.0)
Eosinophils Absolute: 0.1 10*3/uL (ref 0.0–0.5)
HEMATOCRIT: 45 % (ref 38.4–49.9)
HEMOGLOBIN: 15.3 g/dL (ref 13.0–17.1)
LYMPH#: 1 10*3/uL (ref 0.9–3.3)
LYMPH%: 22.9 % (ref 14.0–49.0)
MCH: 32.5 pg (ref 27.2–33.4)
MCHC: 34 g/dL (ref 32.0–36.0)
MCV: 95.5 fL (ref 79.3–98.0)
MONO#: 0.5 10*3/uL (ref 0.1–0.9)
MONO%: 12.4 % (ref 0.0–14.0)
NEUT%: 60.1 % (ref 39.0–75.0)
NEUTROS ABS: 2.6 10*3/uL (ref 1.5–6.5)
PLATELETS: 248 10*3/uL (ref 140–400)
RBC: 4.71 10*6/uL (ref 4.20–5.82)
RDW: 13.7 % (ref 11.0–14.6)
WBC: 4.3 10*3/uL (ref 4.0–10.3)

## 2017-03-27 LAB — TECHNOLOGIST REVIEW

## 2017-03-27 MED ORDER — IOPAMIDOL (ISOVUE-300) INJECTION 61%
100.0000 mL | Freq: Once | INTRAVENOUS | Status: AC | PRN
  Administered 2017-03-27: 100 mL via INTRAVENOUS

## 2017-03-27 MED ORDER — IOPAMIDOL (ISOVUE-300) INJECTION 61%
INTRAVENOUS | Status: AC
  Filled 2017-03-27: qty 100

## 2017-03-28 LAB — PSA: Prostate Specific Ag, Serum: 3.2 ng/mL (ref 0.0–4.0)

## 2017-03-29 ENCOUNTER — Telehealth: Payer: Self-pay

## 2017-03-29 ENCOUNTER — Ambulatory Visit (HOSPITAL_BASED_OUTPATIENT_CLINIC_OR_DEPARTMENT_OTHER): Payer: Medicare Other

## 2017-03-29 ENCOUNTER — Ambulatory Visit (HOSPITAL_BASED_OUTPATIENT_CLINIC_OR_DEPARTMENT_OTHER): Payer: Medicare Other | Admitting: Oncology

## 2017-03-29 VITALS — BP 166/83 | HR 73 | Temp 97.7°F | Resp 20 | Ht 68.0 in | Wt 157.1 lb

## 2017-03-29 DIAGNOSIS — E291 Testicular hypofunction: Secondary | ICD-10-CM | POA: Diagnosis not present

## 2017-03-29 DIAGNOSIS — C7951 Secondary malignant neoplasm of bone: Secondary | ICD-10-CM | POA: Diagnosis not present

## 2017-03-29 DIAGNOSIS — M2550 Pain in unspecified joint: Secondary | ICD-10-CM | POA: Diagnosis not present

## 2017-03-29 DIAGNOSIS — R630 Anorexia: Secondary | ICD-10-CM

## 2017-03-29 DIAGNOSIS — R627 Adult failure to thrive: Secondary | ICD-10-CM

## 2017-03-29 DIAGNOSIS — C61 Malignant neoplasm of prostate: Secondary | ICD-10-CM

## 2017-03-29 DIAGNOSIS — R5383 Other fatigue: Secondary | ICD-10-CM | POA: Diagnosis not present

## 2017-03-29 MED ORDER — DENOSUMAB 120 MG/1.7ML ~~LOC~~ SOLN
120.0000 mg | Freq: Once | SUBCUTANEOUS | Status: AC
  Administered 2017-03-29: 120 mg via SUBCUTANEOUS
  Filled 2017-03-29: qty 1.7

## 2017-03-29 NOTE — Progress Notes (Addendum)
Hematology and Oncology Follow Up Visit  Lucas Holloway [id number] 1941-03-05 76 y.o. 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 He was initially diagnosed in 2009 without Gleason score 6, stage TIc and a PSA of around 9.   Prior Therapy: He underwent seed implant brachytherapy with excellent PSA response initially. His PSA nadir was 0.7 and 2013.  His PSA did go up to 1.2 in October 2016 however. His staging workup at that time including a bone scan which showed metastatic bony disease.  He was started on androgen deprivation under the care of Dr. Jeffie Holloway and his PSA did drop down initially to 0.61 in May 2017.  On 12/02/2015 his PSA was 1.44 with a testosterone level of 32. A repeat a bone scan obtained on 12/13/2015 showed progression of disease with increased uptake in the anterior and posterior ribs, sternum and right humerus Zytiga 1000 mg daily with prednisone 5 mg daily started on 01/07/2016.  Current therapy:  Xtandi 160 mg daily started in August 2018. The dose was reduced to 80 mg daily starting on 02/12/2017 for better tolerance. He is receiving Xgeva every 6 weeks. He is currently on Lupron every 3 months.   Interim History: Lucas Holloway presents today for a follow-up visit. Since the last visit, he continues to take Xtandi at 80 mg daily with few complaints.  He continues to have issues with anorexia and lost 9 pounds since last visit.  He reports also overall weakness and unsteadiness despite improvement slightly with a lower dose continues to be an issue. He denied any nausea, vomiting or syncope. He does report some mild fatigue and arthralgias.  His ability to perform activities of daily living have also declined.  He does report diffuse arthralgias and myalgias as well related to his prostate cancer.  He also reported episodic confusion at  times and word finding difficulties.  He does not report any headaches, blurry vision, syncope or seizures. He does not report any fevers or chills or sweats. He does not report any cough, wheezing or hemoptysis. He does not report any nausea, vomiting or abdominal pain. He does not report any frequency urgency or hesitancy. He does not report any skeletal complaints of arthralgias myalgias. He does not report any lymphadenopathy or petechiae. Remaining review of systems unremarkable.   Medications: I have reviewed the patient's current medications.  Current Outpatient Prescriptions  Medication Sig Dispense Refill  . calcium-vitamin D (OSCAL WITH D) 500-200 MG-UNIT per tablet Take 1 tablet by mouth 2 (two) times daily.    . Degarelix Acetate (FIRMAGON [place]) One injection a month    . Denosumab (XGEVA [place]) One injection a month    . enzalutamide (XTANDI) 40 MG capsule Take 4 capsules (160 mg total) by mouth daily. 120 capsule 0  . fish oil-omega-3 fatty acids 1000 MG capsule Take 1 g by mouth daily.     . folic acid (FOLVITE) 1 MG tablet Take 1 mg by mouth daily.    Marland Kitchen levothyroxine (SYNTHROID, LEVOTHROID) 75 MCG tablet Take 75 mcg by mouth daily before breakfast.   3  . meloxicam (MOBIC) 7.5 MG tablet Take 7.5 mg by mouth daily.    . methotrexate (RHEUMATREX) 2.5 MG tablet Take 15 mg by mouth once a week. Takes on sundays.Marland KitchenMarland KitchenCaution:Chemotherapy. Protect from light.    . Multiple Vitamin (MULTIVITAMIN WITH MINERALS) TABS Take 1 tablet by mouth daily.    Marland Kitchen  prochlorperazine (COMPAZINE) 10 MG tablet TAKE 1 TABLET(10 MG) BY MOUTH EVERY 6 HOURS AS NEEDED FOR NAUSEA OR VOMITING 385 tablet 0  . simvastatin (ZOCOR) 20 MG tablet Take 1 tablet by mouth daily at 2 PM.  3  . Vitamin D, Ergocalciferol, (DRISDOL) 50000 units CAPS capsule Take 50,000 Units by mouth daily.    . vitamin E 400 UNIT capsule Take 800 Units by mouth daily.      No current facility-administered medications for this visit.       Allergies: No Known Allergies  Past Medical History, Surgical history, Social history, and Family History were reviewed and updated.  Physical Exam: Blood pressure (!) 166/83, pulse 73, temperature 97.7 F (36.5 C), temperature source Oral, resp. rate 20, height 5\' 8"  (1.727 m), weight 157 lb 1.6 oz (71.3 kg), SpO2 97 %. ECOG: 1 General appearance: Chronically ill-appearing gentleman appears without distress today. Head: Normocephalic, without obvious abnormality no oral thrush or ulcers. Neck: no adenopathy no neck masses. Lymph nodes: Cervical, supraclavicular, and axillary nodes normal. Heart:regular rate and rhythm, S1, S2 normal, no murmur, click, rub or gallop Lung:chest clear, no wheezing, rales, normal symmetric air entry Abdomin: soft, non-tender, without masses or organomegaly no rebound or guarding. EXT:no edema noted.   Lab Results: Lab Results  Component Value Date   WBC 4.3 03/27/2017   HGB 15.3 03/27/2017   HCT 45.0 03/27/2017   MCV 95.5 03/27/2017   PLT 248 03/27/2017     Chemistry      Component Value Date/Time   NA 126 (L) 03/27/2017 0914   K 4.3 03/27/2017 0914   CO2 22 03/27/2017 0914   BUN 13.6 03/27/2017 0914   CREATININE 0.8 03/27/2017 0914      Component Value Date/Time   CALCIUM 9.1 03/27/2017 0914   ALKPHOS 77 03/27/2017 0914   AST 40 (H) 03/27/2017 0914   ALT 24 03/27/2017 0914   BILITOT 0.71 03/27/2017 0914       Results for Lucas Holloway, Lucas Holloway (MRN [id number]) as of 03/29/2017 15:42  Ref. Range 01/10/2017 08:08 02/27/2017 08:57 03/27/2017 09:14  Prostate Specific Ag, Serum Latest Ref Range: 0.0 - 4.0 ng/mL 3.9 2.4 3.2    EXAM: CT ABDOMEN AND PELVIS WITH CONTRAST  TECHNIQUE: Multidetector CT imaging of the abdomen and pelvis was performed using the standard protocol following bolus administration of intravenous contrast.  CONTRAST:  151mL ISOVUE-300 IOPAMIDOL (ISOVUE-300) INJECTION 61%  COMPARISON:  12/13/2015 CT.  Bone scan  01/10/2017.  FINDINGS: Lower chest: Normal heart size with right coronary artery atherosclerosis. A left-sided pleural effusion is small and new. Adjacent left base dependent atelectasis.  Hepatobiliary: mild hepatic steatosis, without focal liver lesion. Hepatomegaly at 18.8 cm craniocaudal. Normal gallbladder, without biliary ductal dilatation.  Pancreas: Normal, without mass or ductal dilatation.  Spleen: Normal in size, without focal abnormality.  Adrenals/Urinary Tract: Normal adrenal glands. 2 mm interpolar left renal collecting system calculus, similar. Normal right kidney. Normal urinary bladder.  Stomach/Bowel: Normal stomach, without wall thickening. Colonic stool burden suggests constipation. Normal terminal ileum and appendix. Normal small bowel.  Vascular/Lymphatic: Aortic and branch vessel atherosclerosis. Nonaneurysmal infrarenal aortic dilatation again identified, including 2.6 cm. The right common iliac is borderline ectatic, similar at 1.5 cm. No abdominopelvic adenopathy.  Reproductive: Radiation seeds in the prostate.  Other: No significant free fluid.  Musculoskeletal: Increasing size of a left ischial sclerotic lesion which measures 2.5 cm on image 84/series 2 versus 1.4 cm on 12/13/2015.  A right iliac index sclerotic lesion measures  2.9 cm on image 60/series 2 today versus 2.1 cm on the prior. Progressive bilateral rib lesions. No complicating vertebral body compression fracture. Disc bulges, including a moderate bulge at L2-3.  IMPRESSION: 1. Since 12/13/2015, progression of osseous metastasis. 2. No soft tissue metastasis identified. 3. New small left pleural effusion. 4. Coronary artery atherosclerosis. Aortic Atherosclerosis (ICD10-I70.0). 5. Hepatic steatosis. 6. Infrarenal aortic and right common iliac artery dilatation, similar. 7. Left nephrolithiasis.  Impression and Plan:  76 year old gentleman with the following  issues:  1. Prostate cancer initially diagnosed in 2009 without Gleason score 6, stage TIc and a PSA of around 9. He was treated with brachytherapy utilizing seed implants and developed recurrent disease in October 2016. He developed a bony metastasis and treated with androgen deprivation.   He developed castration resistant disease in June 2017 with progressive bony metastasis.  He S/P Zytiga on 01/07/2016 to August 2018. Therapy discontinued because of progression of disease.   He is currently on Xtandi 80 mg daily which was better tolerated.  Despite the lower dose, he continues to have issues with anorexia and generalized fatigue.  His CT scan of the abdomen and pelvis obtained on 03/27/2017 was reviewed and showed no visceral metastasis.  His PSA continues to be relatively stable although slightly increased.  Based on these findings, I have recommended discontinuation of Xtandi and I will refer him to radiation oncology for evaluation for [facility].  Complications associated with this medication were reviewed today briefly as well as the benefits which include overall survival.  I feel is a reasonable treatment to consider given his bone disease only as well as his symptoms related to his prostate cancer.  2. Bone directed therapy: He is currently receiving Xgeva . He is on calcium and vitamin D supplements. He already obtained dental clearance prior to Xgeva start in 2016.  Will continue to receive this treatment every 4-6 weeks.  3. Androgen depravation: I recommended to continue therapy indefinitely. He will receive Lupron after May 29, 2017.  4. Nausea: Remains to be reasonably controlled at this time.   5.  Anorexia: We have discussed different strategies to boost his nutritional intake.  Discontinuation of Xtandi will help.  6.  Weakness and failure to thrive: Could be related to [facility] and stopping this medication should help.  7. Follow-up: In 4 weeks to follow his  progress.    Zola Button, MD 10/25/20183:40 PM

## 2017-03-29 NOTE — Telephone Encounter (Signed)
Printed avs and calender for up coming appointment. Per 10/25 los

## 2017-03-30 NOTE — Progress Notes (Signed)
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 His PSA nadir was 0.7 and 2013.  His PSA did go up to 1.2 in October 2016 however. His staging workup at that time including a bone scan which showed metastatic bony disease.  He was started on androgen deprivation under the care of Dr. [name] and his PSA did drop down initially to 0.61 in May 2017.  On 12/02/2015 his PSA was 1.44 with a testosterone level of 32. A repeat a bone scan obtained on 12/13/2015 showed progression of disease with increased uptake in the anterior and posterior ribs, sternum and right humerus Zytiga 1000 mg daily with prednisone 5 mg daily started on 01/07/2016.  Current therapy:  Xtandi 160 mg daily started in August 2018. The dose was reduced to 80 mg daily starting on 02/12/2017 for better tolerance. Stopped  He is receiving Xgeva every 6 weeks. Last injection 03/29/17 He is currently on Lupron every 3 months. Last injection 02/27/17  Pain on a scale of 0-10 is: none     If Spine Met(s), symptoms, if any, include:  Bowel/Bladder retention or incontinence (please describe): regular bowels 2x day, 8-9x nocturia, urgency,not fully emptying, normal stream  Numbness or weakness in extremities (please describe): none  Current Decadron regimen, if applicable: none  Ambulatory status? Walker? Wheelchair?: None  SAFETY ISSUES: no  Prior radiation? Brachytherapy 2009 with Dr. Tammi Klippel  Pacemaker/ICD?NO  Possible current pregnancy?   Is the patient on methotrexate? yes  Current Complaints / other details:  76 year old male. Dr. [name]  recommended discontinuation of Gillermina Phy and is referring him to radiation oncology for evaluation for [facility]. Reports issues with anorexia and generalized fatigue.  He will receive Lupron after May 29, 2017.He reports overall weakness and unsteadiness despite improvement slightly. His ability to perform activities of daily living have declined.  He does report diffuse arthralgias and myalgias. He reported episodic confusion at times and word finding difficulties.    BP 130/75   Pulse 74   Temp 97.8 F (36.6 C) (Oral)   Resp 20   Ht 5' 8"  (1.727 m)   Wt 157 lb 12.8 oz (71.6 kg)   SpO2 98%   BMI 23.99 kg/m   Wt Readings from Last 3 Encounters:  04/02/17 157 lb 12.8 oz (71.6 kg)  03/29/17 157 lb 1.6 oz (71.3 kg)  02/27/17 165 lb 12.8 oz (75.2 kg)

## 2017-04-02 ENCOUNTER — Encounter: Payer: Self-pay | Admitting: Radiation Oncology

## 2017-04-02 ENCOUNTER — Encounter: Payer: Self-pay | Admitting: Medical Oncology

## 2017-04-02 ENCOUNTER — Ambulatory Visit
Admission: RE | Admit: 2017-04-02 | Discharge: 2017-04-02 | Disposition: A | Discharge status: Home / Self Care | Payer: Medicare Other | Source: Ambulatory Visit | Attending: Radiation Oncology | Admitting: Radiation Oncology

## 2017-04-02 VITALS — BP 130/75 | HR 74 | Temp 97.8°F | Resp 20 | Ht 68.0 in | Wt 157.8 lb

## 2017-04-02 DIAGNOSIS — Z9289 Personal history of other medical treatment: Secondary | ICD-10-CM | POA: Diagnosis not present

## 2017-04-02 DIAGNOSIS — Z79818 Long term (current) use of other agents affecting estrogen receptors and estrogen levels: Secondary | ICD-10-CM | POA: Diagnosis not present

## 2017-04-02 DIAGNOSIS — Z79899 Other long term (current) drug therapy: Secondary | ICD-10-CM | POA: Diagnosis not present

## 2017-04-02 DIAGNOSIS — Z809 Family history of malignant neoplasm, unspecified: Secondary | ICD-10-CM

## 2017-04-02 DIAGNOSIS — R9721 Rising PSA following treatment for malignant neoplasm of prostate: Secondary | ICD-10-CM | POA: Diagnosis not present

## 2017-04-02 DIAGNOSIS — L4050 Arthropathic psoriasis, unspecified: Secondary | ICD-10-CM | POA: Insufficient documentation

## 2017-04-02 DIAGNOSIS — C7951 Secondary malignant neoplasm of bone: Secondary | ICD-10-CM

## 2017-04-02 DIAGNOSIS — C61 Malignant neoplasm of prostate: Secondary | ICD-10-CM | POA: Diagnosis not present

## 2017-04-02 DIAGNOSIS — C7952 Secondary malignant neoplasm of bone marrow: Principal | ICD-10-CM

## 2017-04-02 DIAGNOSIS — Z8546 Personal history of malignant neoplasm of prostate: Secondary | ICD-10-CM | POA: Diagnosis not present

## 2017-04-02 DIAGNOSIS — Z87891 Personal history of nicotine dependence: Secondary | ICD-10-CM | POA: Diagnosis not present

## 2017-04-02 DIAGNOSIS — Z79890 Hormone replacement therapy (postmenopausal): Secondary | ICD-10-CM | POA: Diagnosis not present

## 2017-04-02 DIAGNOSIS — Z8041 Family history of malignant neoplasm of ovary: Secondary | ICD-10-CM | POA: Insufficient documentation

## 2017-04-02 HISTORY — DX: Other psoriatic arthropathy: L40.59

## 2017-04-02 NOTE — Progress Notes (Signed)
Radiation Oncology         [phone]) [phone] ________________________________  Initial outpatient Consultation  Name: Lucas Holloway MRN: [id number]  Date: 04/02/2017  DOB: [birth year]  ZC:HYIFO, [name], MD  Wyatt Portela, MD   REFERRING PHYSICIAN: Wyatt Portela, MD  DIAGNOSIS: 76 y.o. 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 Malignant neoplasm metastatic to bone ([place]) C79.51   2. Prostate cancer ([place]) C61     HISTORY OF PRESENT ILLNESS: Lucas Holloway is a 76 y.o. male with a history of prostate cancer, Gleason score 3+3 and PSA of 9. He underwent seed implant brachytherapy in 2009 with excellent PSA response, and his nadir PSA was 0.7 in 2013. His PSA did go up to 1.2 in October 2016 however. His staging workup at that time included a bone scan which showed metastatic bony disease. He was started on androgen deprivation under the care of Dr. [name] and his PSA dropped down initially to 0.61 in May 2017. On 12/02/2015, his PSA was 1.44 with a testosterone level of 32. Repeat bone scan obtained on 12/13/2015 showed progression of disease with increased uptake in the anterior and posterior ribs, sternum, and right humerus. The patient was started on Zytiga 1000 mg daily with prednisone 5 mg daily on 01/07/2016. He had progression and was started on  Xtandi 160 mgin August 2018. The dose was reduced to 80 mg daily starting on 02/12/17 for better tolerance and has since been stopped due to side effects. He is receiving Xgeva every 6 weeks, with last injection on 03/29/17. He is currently on Lupron every 3 months, with last injection on 02/27/17.  CT Abdomen/Pelvis on 03/27/17 showed progression of osseous metastasis compared with scans on 12/13/15. This included increasing size of a left ischial sclerotic lesion which measures 2.5 cm versus 1.4 cm on 12/13/15. Also a right iliac index sclerotic lesion measuring 2.9 cm versus 2.1 cm on prior exam,  and progressive bilateral rib lesions. There was no soft tissue metastasis identified. As a result of this, Dr. [name] recommended discontinuation of Gillermina Phy and is referring him to radiation oncology for evaluation for [facility] treatment.    PREVIOUS RADIATION THERAPY:    2009: Radioactive seed implant to the prostate with Dr. Tammi Klippel  PAST MEDICAL HISTORY:  Past Medical History:  Diagnosis Date  . Polyarticular psoriatic arthritis ([place])   . Prostate cancer ([place])       PAST SURGICAL HISTORY: Past Surgical History:  Procedure Laterality Date  . radioactive seed implant prostate  2009    FAMILY HISTORY:  Family History  Problem Relation Age of Onset  . Cancer Sister        Ovarian    SOCIAL HISTORY:  Social History   Social History  . Marital status: Married    Spouse name: N/A  . Number of children: N/A  . Years of education: N/A   Occupational History  . Not on file.   Social History Main Topics  . Smoking status: Former Research scientist (life sciences)  . Smokeless tobacco: Never Used  . Alcohol use No  . Drug use: No  . Sexual activity: Not on file   Other Topics Concern  . Not on file   Social History Narrative  . No narrative on file  The patient is married and lives in [place]. He used to play golf regularly prior to his bone metastases.  ALLERGIES: Patient has no known allergies.  MEDICATIONS:  Current  Outpatient Prescriptions  Medication Sig Dispense Refill  . calcium-vitamin D (OSCAL WITH D) 500-200 MG-UNIT per tablet Take 1 tablet by mouth 2 (two) times daily.    . Degarelix Acetate (FIRMAGON [place]) One injection a month    . Denosumab (XGEVA [place]) One injection a month    . fish oil-omega-3 fatty acids 1000 MG capsule Take 1 g by mouth daily.     . folic acid (FOLVITE) 1 MG tablet Take 1 mg by mouth daily.    Marland Kitchen levothyroxine (SYNTHROID, LEVOTHROID) 75 MCG tablet Take 75 mcg by mouth daily before breakfast.   3  . meloxicam (MOBIC) 7.5 MG tablet Take 7.5 mg by mouth daily.     . methotrexate (RHEUMATREX) 2.5 MG tablet Take 15 mg by mouth once a week. Takes on sundays.Marland KitchenMarland KitchenCaution:Chemotherapy. Protect from light.    . Multiple Vitamin (MULTIVITAMIN WITH MINERALS) TABS Take 1 tablet by mouth daily.    . simvastatin (ZOCOR) 20 MG tablet Take 1 tablet by mouth daily at 2 PM.  3  . Vitamin D, Ergocalciferol, (DRISDOL) 50000 units CAPS capsule Take 50,000 Units by mouth daily.    . vitamin E 400 UNIT capsule Take 800 Units by mouth daily.     . enzalutamide (XTANDI) 40 MG capsule Take 4 capsules (160 mg total) by mouth daily. (Patient not taking: Reported on 04/02/2017) 120 capsule 0  . prochlorperazine (COMPAZINE) 10 MG tablet TAKE 1 TABLET(10 MG) BY MOUTH EVERY 6 HOURS AS NEEDED FOR NAUSEA OR VOMITING (Patient not taking: Reported on 04/02/2017) 385 tablet 0   No current facility-administered medications for this encounter.     REVIEW OF SYSTEMS:  On review of systems, the patient reports that he is doing well overall. He denies any pain. He reports feeling 60% better since discontinuing Xtandi. He denies any chest pain, shortness of breath, cough, fevers, chills, night sweats, unintended weight changes. He denies any bowel disturbances, and denies abdominal pain, nausea or vomiting. He reports regular bowels 2x day. He denies any new musculoskeletal or joint aches or pains. He denies any numbness or weakness in extremities. He is not currently on Decadron. He is currently on methotrexate for rheumatoid arthritis. He reports nocturia x10-12, urgency, not fully emptying, and normal stream. 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 A complete review of systems is obtained and is otherwise negative.    PHYSICAL EXAM:  Wt Readings from Last 3 Encounters:  04/02/17 157 lb 12.8 oz (71.6 kg)  03/29/17 157 lb 1.6 oz (71.3 kg)  02/27/17 165 lb 12.8 oz (75.2 kg)   Temp Readings from Last 3 Encounters:  04/02/17 97.8 F (36.6 C) (Oral)  03/29/17 97.7 F (36.5 C) (Oral)    02/27/17 97.8 F (36.6 C) (Oral)   BP Readings from Last 3 Encounters:  04/02/17 130/75  03/29/17 (!) 166/83  02/27/17 129/66   Pulse Readings from Last 3 Encounters:  04/02/17 74  03/29/17 73  02/27/17 69   Pain Assessment Pain Score: 0-No pain/10  In general this is a well appearing caucasian male in no acute distress. Accompanied by his wife today. He is alert and oriented x4 and appropriate throughout the examination. HEENT reveals that the patient is normocephalic, atraumatic. EOMs are intact. PERRLA. Skin is intact without any evidence of gross lesions. Cardiovascular exam reveals a regular rate and rhythm, no clicks rubs or murmurs are auscultated. Chest is clear to auscultation bilaterally. Lymphatic assessment is performed and does not reveal any adenopathy  in the cervical, supraclavicular, axillary, or inguinal chains. Abdomen has active bowel sounds in all quadrants and is intact. The abdomen is soft, non tender, non distended. Lower extremities are negative for pretibial pitting edema, deep calf tenderness, cyanosis or clubbing.   KPS = 90  100 - Normal; no complaints; no evidence of disease. 90   - Able to carry on normal activity; minor signs or symptoms of disease. 80   - Normal activity with effort; some signs or symptoms of disease. 37   - Cares for self; unable to carry on normal activity or to do active work. 60   - Requires occasional assistance, but is able to care for most of his personal needs. 50   - Requires considerable assistance and frequent medical care. 19   - Disabled; requires special care and assistance. 31   - Severely disabled; hospital admission is indicated although death not imminent. 43   - Very sick; hospital admission necessary; active supportive treatment necessary. 10   - Moribund; fatal processes progressing rapidly. 0     - Dead  Karnofsky DA, Abelmann Burley, Craver LS and Burchenal [facility] [phone]) The use of the nitrogen mustards in the  palliative treatment of carcinoma: with particular reference to bronchogenic carcinoma Cancer 1 [phone]  LABORATORY DATA:  Lab Results  Component Value Date   WBC 4.3 03/27/2017   HGB 15.3 03/27/2017   HCT 45.0 03/27/2017   MCV 95.5 03/27/2017   PLT 248 03/27/2017   Lab Results  Component Value Date   NA 126 (L) 03/27/2017   K 4.3 03/27/2017   CO2 22 03/27/2017   Lab Results  Component Value Date   ALT 24 03/27/2017   AST 40 (H) 03/27/2017   ALKPHOS 77 03/27/2017   BILITOT 0.71 03/27/2017     RADIOGRAPHY: Ct Abdomen Pelvis W Contrast  Result Date: 03/27/2017 CLINICAL DATA:  Prostate cancer diagnosed in 2016. Status post systemic therapy. Evaluate treatment response. EXAM: CT ABDOMEN AND PELVIS WITH CONTRAST TECHNIQUE: Multidetector CT imaging of the abdomen and pelvis was performed using the standard protocol following bolus administration of intravenous contrast. CONTRAST:  154m ISOVUE-300 IOPAMIDOL (ISOVUE-300) INJECTION 61% COMPARISON:  12/13/2015 CT.  Bone scan 01/10/2017. FINDINGS: Lower chest: Normal heart size with right coronary artery atherosclerosis. A left-sided pleural effusion is small and new. Adjacent left base dependent atelectasis. Hepatobiliary: mild hepatic steatosis, without focal liver lesion. Hepatomegaly at 18.8 cm craniocaudal. Normal gallbladder, without biliary ductal dilatation. Pancreas: Normal, without mass or ductal dilatation. Spleen: Normal in size, without focal abnormality. Adrenals/Urinary Tract: Normal adrenal glands. 2 mm interpolar left renal collecting system calculus, similar. Normal right kidney. Normal urinary bladder. Stomach/Bowel: Normal stomach, without wall thickening. Colonic stool burden suggests constipation. Normal terminal ileum and appendix. Normal small bowel. Vascular/Lymphatic: Aortic and branch vessel atherosclerosis. Nonaneurysmal infrarenal aortic dilatation again identified, including 2.6 cm. The right common iliac is borderline  ectatic, similar at 1.5 cm. No abdominopelvic adenopathy. Reproductive: Radiation seeds in the prostate. Other: No significant free fluid. Musculoskeletal: Increasing size of a left ischial sclerotic lesion which measures 2.5 cm on image 84/series 2 versus 1.4 cm on 12/13/2015. A right iliac index sclerotic lesion measures 2.9 cm on image 60/series 2 today versus 2.1 cm on the prior. Progressive bilateral rib lesions. No complicating vertebral body compression fracture. Disc bulges, including a moderate bulge at L2-3. IMPRESSION: 1. Since 12/13/2015, progression of osseous metastasis. 2. No soft tissue metastasis identified. 3. New small left pleural effusion. 4. Coronary artery  atherosclerosis. Aortic Atherosclerosis (ICD10-I70.0). 5. Hepatic steatosis. 6. Infrarenal aortic and right common iliac artery dilatation, similar. 7. Left nephrolithiasis. Electronically Signed   By: Abigail Miyamoto M.D.   On: 03/27/2017 12:18      IMPRESSION/PLAN: 1. 76 y.o. 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  We met with the patient and family about the findings and work-up thus far.  We discussed the natural history of castration-resistant prostate cancer and general treatment, highlighting the role of Xofigo infusion in the management.  We discussed the available radioactive isotope techniques, and focused on the details of logistics and delivery at length.  We reviewed the anticipated acute and late sequelae associated with Xofigo infusion in this setting, as well as the delivery and logistics of therapy. We would anticipate 6 treatments given q 4 weeks and administered in Nuclear Medicine.  The patient was encouraged to ask questions that we answered to the best of our ability. The patient would like to proceed with [facility] treatment. We will work on scheduling treatment to begin in the near future. 2.  Possible genetic predisposition to malignancy. Given his personal and family  history, he may have a genetic predisposition to disease and a referral to genetics counseling was offered. However, the patient is not interested in a genetics referral at this time.      Carola Rhine, PAC    Tyler Pita, MD  [facility] Direct Dial: [phone]  Fax: [phone] [email or web address]  Skype  [email or web address]    Page Me      This document serves as a record of services personally performed by Tyler Pita, MD and Shona Simpson, PA-C. It was created on their behalf by Arlyce Harman, a trained medical scribe. The creation of this record is based on the scribe's personal observations and the provider's statements to them. This document has been checked and approved by the attending provider.

## 2017-04-02 NOTE — Progress Notes (Signed)
Mr. Hanken and his wife here to discuss Xofigo for bone mets. He was diagnosed 9 years ago with prostate cancer. He states he took [place] and due to progression started [place]. He stopped the Zytiga last Thursday and staetes he is beginning to feel human. He states he lost weight, and had gotten weak and unstable on his feet.  He is hoping his energy will continue to improve so he can play golf. We discussed Trudi Ida cost and patient assistance. He will be seen by Toniann Ket before he leaves to sign the forms to apply for patient assistance.

## 2017-04-02 NOTE — Progress Notes (Signed)
Please see the Nurse Progress Note in the MD Initial Consult Encounter for this patient. 

## 2017-04-04 ENCOUNTER — Encounter: Payer: Self-pay | Admitting: Radiation Oncology

## 2017-04-04 NOTE — Progress Notes (Signed)
  Radiation Oncology         [phone]) [phone] ________________________________  Name: Lucas Holloway MRN: [id number]  Date: 04/02/2017  DOB: 06-Apr-1941  Xofigo Treatment Planning Note:  Diagnosis:  Castration resistant prostate cancer with painful bone involvement  Narrative: Mr.Lucas Holloway is a patient who has been diagnosed with castration resistant prostate cancer with painful bone involvement.  His most recent blood counts show that he remains a good candidate to proceed with Ra-223.  The patient is going to receive Xofigo for his treatment.   Radiation Treatment Planning:  The prescribed radiation activity will be 50 kBq per kg per infusions. The plan is to offer a total of 6 IV administrations of this agent, assuming the blood counts are adequate prior to each administration, with each infusion done at 4 week intervals.  This will be done as an IV administration in the nuclear medicine department, with care to undertake all radiation protection precautions as recommended.   ________________________________  Sheral Apley. Tammi Klippel, M.D.

## 2017-04-04 NOTE — Progress Notes (Signed)
  Radiation Oncology         [phone]) [phone] ________________________________  Name: Lucas Holloway MRN: [id number]  Date: 04/02/2017  DOB: 10/28/1940   To whom it may concern:   Lucas Holloway is a patient with metastatic, castrate resistant prostate cancer with predominantly bone disease.  For details of his clinical history, please see the previously detailed consultation report that can be supplied with this letter.  I have recommended Radium 223 Dichloride treatment for monthly IV infusion over six months.  Based upon the randomized phase III Alsympca trial, we would expect an improvement in the patient's overall survival, improved pain control, decreased narcotic requirements, and decreased probability of skeletal related adverse events moving forward.  Given the data and this patient's situation, I feel that the delivery of Trudi Ida is medical necessity.  Please call if any further information is required to aid in this patient's treatment.    Sincerely yours,  Sheral Apley. Tammi Klippel, M.D.   -----------------------------------------------------------------------------------------------------------  Ref:  Zack Seal, Diannia Ruder, et al., Alpha emitter radium-223 and survival in metastatic prostate cancer. [place] Med. 2013 Jul 18;369(3):213-23

## 2017-04-23 ENCOUNTER — Telehealth: Payer: Self-pay | Admitting: *Deleted

## 2017-04-23 ENCOUNTER — Other Ambulatory Visit: Payer: Self-pay | Admitting: Radiation Oncology

## 2017-04-23 DIAGNOSIS — C7951 Secondary malignant neoplasm of bone: Principal | ICD-10-CM

## 2017-04-23 DIAGNOSIS — C61 Malignant neoplasm of prostate: Secondary | ICD-10-CM

## 2017-04-23 NOTE — Telephone Encounter (Signed)
CALLED PATIENT TO INFORM OF LABS AND WEIGHT FOR 04-24-17 @ 12 PM AND HIS INJECTION ON 05-04-17 @ 12 PM @ [facility], LVM FOR A RETURN CALL

## 2017-04-24 ENCOUNTER — Ambulatory Visit
Admission: RE | Admit: 2017-04-24 | Discharge: 2017-04-24 | Disposition: A | Discharge status: Home / Self Care | Payer: Medicare Other | Source: Ambulatory Visit | Attending: Radiation Oncology | Admitting: Radiation Oncology

## 2017-04-24 ENCOUNTER — Other Ambulatory Visit: Payer: Self-pay | Admitting: Radiation Oncology

## 2017-04-24 DIAGNOSIS — C7952 Secondary malignant neoplasm of bone marrow: Principal | ICD-10-CM

## 2017-04-24 DIAGNOSIS — C7951 Secondary malignant neoplasm of bone: Secondary | ICD-10-CM

## 2017-04-24 LAB — CBC WITH DIFFERENTIAL/PLATELET
BASO%: 1 % (ref 0.0–2.0)
Basophils Absolute: 0.1 10*3/uL (ref 0.0–0.1)
EOS%: 1.9 % (ref 0.0–7.0)
Eosinophils Absolute: 0.1 10*3/uL (ref 0.0–0.5)
HEMATOCRIT: 41.2 % (ref 38.4–49.9)
HGB: 14.1 g/dL (ref 13.0–17.1)
LYMPH#: 0.8 10*3/uL — AB (ref 0.9–3.3)
LYMPH%: 14.5 % (ref 14.0–49.0)
MCH: 32.6 pg (ref 27.2–33.4)
MCHC: 34.2 g/dL (ref 32.0–36.0)
MCV: 95.2 fL (ref 79.3–98.0)
MONO#: 0.5 10*3/uL (ref 0.1–0.9)
MONO%: 8.8 % (ref 0.0–14.0)
NEUT%: 73.8 % (ref 39.0–75.0)
NEUTROS ABS: 4.1 10*3/uL (ref 1.5–6.5)
Platelets: 239 10*3/uL (ref 140–400)
RBC: 4.33 10*6/uL (ref 4.20–5.82)
RDW: 13.6 % (ref 11.0–14.6)
WBC: 5.5 10*3/uL (ref 4.0–10.3)

## 2017-05-03 ENCOUNTER — Telehealth: Payer: Self-pay | Admitting: *Deleted

## 2017-05-03 NOTE — Telephone Encounter (Signed)
CALLED PATIENT TO REMIND OF XOFIGO INJ. FOR 05-04-17- ARRIVAL  TIME 11:45 AM @ [facility], LVM FOR A RETURN CALL

## 2017-05-04 ENCOUNTER — Ambulatory Visit (HOSPITAL_BASED_OUTPATIENT_CLINIC_OR_DEPARTMENT_OTHER): Payer: Medicare Other | Admitting: Oncology

## 2017-05-04 ENCOUNTER — Telehealth: Payer: Self-pay | Admitting: Oncology

## 2017-05-04 ENCOUNTER — Other Ambulatory Visit (HOSPITAL_BASED_OUTPATIENT_CLINIC_OR_DEPARTMENT_OTHER): Payer: Medicare Other

## 2017-05-04 ENCOUNTER — Ambulatory Visit (HOSPITAL_BASED_OUTPATIENT_CLINIC_OR_DEPARTMENT_OTHER): Payer: Medicare Other

## 2017-05-04 ENCOUNTER — Encounter (HOSPITAL_COMMUNITY)
Admission: RE | Admit: 2017-05-04 | Discharge: 2017-05-04 | Disposition: A | Discharge status: Home / Self Care | Payer: Medicare Other | Source: Ambulatory Visit | Attending: Radiation Oncology | Admitting: Radiation Oncology

## 2017-05-04 VITALS — BP 122/73 | HR 85 | Temp 98.0°F | Resp 17 | Ht 68.0 in | Wt 159.4 lb

## 2017-05-04 DIAGNOSIS — E291 Testicular hypofunction: Secondary | ICD-10-CM | POA: Diagnosis not present

## 2017-05-04 DIAGNOSIS — C7951 Secondary malignant neoplasm of bone: Secondary | ICD-10-CM

## 2017-05-04 DIAGNOSIS — C61 Malignant neoplasm of prostate: Secondary | ICD-10-CM

## 2017-05-04 LAB — CBC WITH DIFFERENTIAL/PLATELET
BASO%: 0.4 % (ref 0.0–2.0)
BASOS ABS: 0 10*3/uL (ref 0.0–0.1)
EOS%: 2.5 % (ref 0.0–7.0)
Eosinophils Absolute: 0.2 10*3/uL (ref 0.0–0.5)
HEMATOCRIT: 40.2 % (ref 38.4–49.9)
HEMOGLOBIN: 13.9 g/dL (ref 13.0–17.1)
LYMPH#: 1.1 10*3/uL (ref 0.9–3.3)
LYMPH%: 16.2 % (ref 14.0–49.0)
MCH: 32.6 pg (ref 27.2–33.4)
MCHC: 34.6 g/dL (ref 32.0–36.0)
MCV: 94.4 fL (ref 79.3–98.0)
MONO#: 0.7 10*3/uL (ref 0.1–0.9)
MONO%: 10.5 % (ref 0.0–14.0)
NEUT#: 4.7 10*3/uL (ref 1.5–6.5)
NEUT%: 70.4 % (ref 39.0–75.0)
PLATELETS: 212 10*3/uL (ref 140–400)
RBC: 4.26 10*6/uL (ref 4.20–5.82)
RDW: 13.6 % (ref 11.0–14.6)
WBC: 6.7 10*3/uL (ref 4.0–10.3)

## 2017-05-04 LAB — COMPREHENSIVE METABOLIC PANEL
ALBUMIN: 3.4 g/dL — AB (ref 3.5–5.0)
ALK PHOS: 115 U/L (ref 40–150)
ALT: 22 U/L (ref 0–55)
ANION GAP: 9 meq/L (ref 3–11)
AST: 27 U/L (ref 5–34)
BUN: 21.2 mg/dL (ref 7.0–26.0)
CALCIUM: 9.1 mg/dL (ref 8.4–10.4)
CO2: 21 mEq/L — ABNORMAL LOW (ref 22–29)
CREATININE: 0.7 mg/dL (ref 0.7–1.3)
Chloride: 101 mEq/L (ref 98–109)
EGFR: >60 mL/min/{1.73_m2} (ref >60)
Glucose: 111 mg/dl (ref 70–140)
POTASSIUM: 4.6 meq/L (ref 3.5–5.1)
Sodium: 131 mEq/L — ABNORMAL LOW (ref 136–145)
Total Bilirubin: 0.36 mg/dL (ref 0.20–1.20)
Total Protein: 6.9 g/dL (ref 6.4–8.3)

## 2017-05-04 MED ORDER — DENOSUMAB 120 MG/1.7ML ~~LOC~~ SOLN
120.0000 mg | Freq: Once | SUBCUTANEOUS | Status: AC
  Administered 2017-05-04: 120 mg via SUBCUTANEOUS
  Filled 2017-05-04: qty 1.7

## 2017-05-04 MED ORDER — RADIUM RA 223 DICHLORIDE 30 MCCI/ML IV SOLN
106.6000 | Freq: Once | INTRAVENOUS | Status: AC
  Administered 2017-05-04: 102.65 via INTRAVENOUS

## 2017-05-04 NOTE — Telephone Encounter (Signed)
Gave avs and calendar for December and March 2019

## 2017-05-04 NOTE — Patient Instructions (Signed)
Denosumab injection  What is this medicine?  DENOSUMAB (den oh sue mab) slows bone breakdown. Prolia is used to treat osteoporosis in women after menopause and in men. Xgeva is used to prevent bone fractures and other bone problems caused by cancer bone metastases. Xgeva is also used to treat giant cell tumor of the bone.  This medicine may be used for other purposes; ask your health care provider or pharmacist if you have questions.  What should I tell my health care provider before I take this medicine?  They need to know if you have any of these conditions:  -dental disease  -eczema  -infection or history of infections  -kidney disease or on dialysis  -low blood calcium or vitamin D  -malabsorption syndrome  -scheduled to have surgery or tooth extraction  -taking medicine that contains denosumab  -thyroid or parathyroid disease  -an unusual reaction to denosumab, other medicines, foods, dyes, or preservatives  -pregnant or trying to get pregnant  -breast-feeding  How should I use this medicine?  This medicine is for injection under the skin. It is given by a health care professional in a hospital or clinic setting.  If you are getting Prolia, a special MedGuide will be given to you by the pharmacist with each prescription and refill. Be sure to read this information carefully each time.  For Prolia, talk to your pediatrician regarding the use of this medicine in children. Special care may be needed. For Xgeva, talk to your pediatrician regarding the use of this medicine in children. While this drug may be prescribed for children as young as 13 years for selected conditions, precautions do apply.  Overdosage: If you think you have taken too much of this medicine contact a poison control center or emergency room at once.  NOTE: This medicine is only for you. Do not share this medicine with others.  What if I miss a dose?  It is important not to miss your dose. Call your doctor or health care professional if you are  unable to keep an appointment.  What may interact with this medicine?  Do not take this medicine with any of the following medications:  -other medicines containing denosumab  This medicine may also interact with the following medications:  -medicines that suppress the immune system  -medicines that treat cancer  -steroid medicines like prednisone or cortisone  This list may not describe all possible interactions. Give your health care provider a list of all the medicines, herbs, non-prescription drugs, or dietary supplements you use. Also tell them if you smoke, drink alcohol, or use illegal drugs. Some items may interact with your medicine.  What should I watch for while using this medicine?  Visit your doctor or health care professional for regular checks on your progress. Your doctor or health care professional may order blood tests and other tests to see how you are doing.  Call your doctor or health care professional if you get a cold or other infection while receiving this medicine. Do not treat yourself. This medicine may decrease your body's ability to fight infection.  You should make sure you get enough calcium and vitamin D while you are taking this medicine, unless your doctor tells you not to. Discuss the foods you eat and the vitamins you take with your health care professional.  See your dentist regularly. Brush and floss your teeth as directed. Before you have any dental work done, tell your dentist you are receiving this medicine.  Do   not become pregnant while taking this medicine or for 5 months after stopping it. Women should inform their doctor if they wish to become pregnant or think they might be pregnant. There is a potential for serious side effects to an unborn child. Talk to your health care professional or pharmacist for more information.  What side effects may I notice from receiving this medicine?  Side effects that you should report to your doctor or health care professional as soon as  possible:  -allergic reactions like skin rash, itching or hives, swelling of the face, lips, or tongue  -breathing problems  -chest pain  -fast, irregular heartbeat  -feeling faint or lightheaded, falls  -fever, chills, or any other sign of infection  -muscle spasms, tightening, or twitches  -numbness or tingling  -skin blisters or bumps, or is dry, peels, or red  -slow healing or unexplained pain in the mouth or jaw  -unusual bleeding or bruising  Side effects that usually do not require medical attention (Report these to your doctor or health care professional if they continue or are bothersome.):  -muscle pain  -stomach upset, gas  This list may not describe all possible side effects. Call your doctor for medical advice about side effects. You may report side effects to FDA at 1-800-FDA-1088.  Where should I keep my medicine?  This medicine is only given in a clinic, doctor's office, or other health care setting and will not be stored at home.  NOTE: This sheet is a summary. It may not cover all possible information. If you have questions about this medicine, talk to your doctor, pharmacist, or health care provider.      2016, [employer]vier/Gold Standard. (2011-11-20 12:37:47)

## 2017-05-04 NOTE — Progress Notes (Signed)
Hematology and Oncology Follow Up Visit  Lucas Holloway [id number] 05/08/1941 76 y.o. 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 He was initially diagnosed in 2009 without Gleason score 6, stage TIc and a PSA of around 9.   Prior Therapy: He underwent seed implant brachytherapy with excellent PSA response initially. His PSA nadir was 0.7 and 2013.  His PSA did go up to 1.2 in October 2016 however. His staging workup at that time including a bone scan which showed metastatic bony disease.  He was started on androgen deprivation under the care of Dr. [name] and his PSA did drop down initially to 0.61 in May 2017.  On 12/02/2015 his PSA was 1.44 with a testosterone level of 32. A repeat a bone scan obtained on 12/13/2015 showed progression of disease with increased uptake in the anterior and posterior ribs, sternum and right humerus Zytiga 1000 mg daily with prednisone 5 mg daily started on 01/07/2016. Xtandi 160 mg daily started in August 2018. The dose was reduced to 80 mg daily starting on 02/12/2017 for better tolerance.  Therapy discontinued in October 2018 because of poor tolerance and progression of disease.  Current therapy:  Xofigo infusion on a monthly basis started on May 04, 2017.  Plan is complete 6 treatments. He is receiving Xgeva every 6 weeks. He is currently on Lupron every 3 months.   Interim History: Lucas Holloway presents today for a follow-up visit. Since the last visit, he reports feeling better after stopping Xtandi.  He regained his appetite and gained some weight back.  He is no longer reporting any weight loss or excessive fatigue or tiredness.  He denies any mental status changes.  He received the first of Xofigo infusion without complications.  He denies any pathological fractures or bone pain.  He does not report any headaches,  blurry vision, syncope or seizures. He does not report any fevers or chills or sweats. He does not report any cough, wheezing or hemoptysis. He does not report any nausea, vomiting or abdominal pain. He does not report any frequency urgency or hesitancy. He does not report any skeletal complaints of arthralgias myalgias. He does not report any lymphadenopathy or petechiae. Remaining review of systems unremarkable.   Medications: I have reviewed the patient's current medications.  Current Outpatient Medications  Medication Sig Dispense Refill  . calcium-vitamin D (OSCAL WITH D) 500-200 MG-UNIT per tablet Take 1 tablet by mouth 2 (two) times daily.    . Degarelix Acetate (FIRMAGON [place]) One injection a month    . Denosumab (XGEVA [place]) One injection a month    . enzalutamide (XTANDI) 40 MG capsule Take 4 capsules (160 mg total) by mouth daily. (Patient not taking: Reported on 04/02/2017) 120 capsule 0  . fish oil-omega-3 fatty acids 1000 MG capsule Take 1 g by mouth daily.     . folic acid (FOLVITE) 1 MG tablet Take 1 mg by mouth daily.    Marland Kitchen levothyroxine (SYNTHROID, LEVOTHROID) 75 MCG tablet Take 75 mcg by mouth daily before breakfast.   3  . meloxicam (MOBIC) 7.5 MG tablet Take 7.5 mg by mouth daily.    . methotrexate (RHEUMATREX) 2.5 MG tablet Take 15 mg by mouth once a week. Takes on sundays.Marland KitchenMarland KitchenCaution:Chemotherapy. Protect from light.    . Multiple Vitamin (MULTIVITAMIN WITH MINERALS) TABS Take 1 tablet by mouth daily.    . prochlorperazine (COMPAZINE) 10  MG tablet TAKE 1 TABLET(10 MG) BY MOUTH EVERY 6 HOURS AS NEEDED FOR NAUSEA OR VOMITING (Patient not taking: Reported on 04/02/2017) 385 tablet 0  . simvastatin (ZOCOR) 20 MG tablet Take 1 tablet by mouth daily at 2 PM.  3  . Vitamin D, Ergocalciferol, (DRISDOL) 50000 units CAPS capsule Take 50,000 Units by mouth daily.    . vitamin E 400 UNIT capsule Take 800 Units by mouth daily.      No current facility-administered medications for this visit.       Allergies: No Known Allergies  Past Medical History, Surgical history, Social history, and Family History were reviewed and updated.  Physical Exam: Blood pressure 122/73, pulse 85, temperature 98 F (36.7 C), temperature source Oral, resp. rate 17, height 5\' 8"  (1.727 m), weight 159 lb 6.4 oz (72.3 kg), SpO2 96 %. ECOG: 1 General appearance: Alert, awake gentleman without distress. Head: Normocephalic, without obvious abnormality no oral ulcers or thrush. Neck: no adenopathy no thyroid masses. Lymph nodes: Cervical, supraclavicular, and axillary nodes normal. Heart:regular rate and rhythm, S1, S2 normal, no murmur, click, rub or gallop Lung:chest clear, no wheezing, rales, normal symmetric air entry Abdomin: soft, non-tender, without masses or organomegaly no pitting dullness or ascites. EXT:no edema noted.   Lab Results: Lab Results  Component Value Date   WBC 6.7 05/04/2017   HGB 13.9 05/04/2017   HCT 40.2 05/04/2017   MCV 94.4 05/04/2017   PLT 212 05/04/2017     Chemistry      Component Value Date/Time   NA 126 (L) 03/27/2017 0914   K 4.3 03/27/2017 0914   CO2 22 03/27/2017 0914   BUN 13.6 03/27/2017 0914   CREATININE 0.8 03/27/2017 0914      Component Value Date/Time   CALCIUM 9.1 03/27/2017 0914   ALKPHOS 77 03/27/2017 0914   AST 40 (H) 03/27/2017 0914   ALT 24 03/27/2017 0914   BILITOT 0.71 03/27/2017 0914       Results for CANNON, ARREOLA (MRN [id number]) as of 03/29/2017 15:42  Ref. Range 01/10/2017 08:08 02/27/2017 08:57 03/27/2017 09:14  Prostate Specific Ag, Serum Latest Ref Range: 0.0 - 4.0 ng/mL 3.9 2.4 3.2      IMPRESSION: 1. Since 12/13/2015, progression of osseous metastasis. 2. No soft tissue metastasis identified. 3. New small left pleural effusion. 4. Coronary artery atherosclerosis. Aortic Atherosclerosis (ICD10-I70.0). 5. Hepatic steatosis. 6. Infrarenal aortic and right common iliac artery dilatation, similar. 7. Left  nephrolithiasis.  Impression and Plan:  76 year old gentleman with the following issues:  1. Prostate cancer initially diagnosed in 2009 without Gleason score 6, stage TIc and a PSA of around 9. He was treated with brachytherapy utilizing seed implants and developed recurrent disease in October 2016. He developed a bony metastasis and treated with androgen deprivation.   He developed castration resistant disease in June 2017 with progressive bony metastasis.  He S/P Zytiga on 01/07/2016 to August 2018. Therapy discontinued because of progression of disease.   He is S/P Gillermina Phy which was discontinued in October 2018 because of progression of disease.Marland Kitchen  His CT scan of the abdomen and pelvis obtained on 03/27/2017 was reviewed and showed no visceral metastasis.    He is currently receiving Xofigo which she has tolerated the first treatment without any complications.  I recommended that he complete the 88-month course before any additional therapy is recommended.  2. Bone directed therapy: He is currently receiving Xgeva . He is on calcium and vitamin D supplements. He already obtained  dental clearance prior to Xgeva start in 2016.  Will continue to receive this treatment every 4 weeks.  3. Androgen depravation: I recommended to continue therapy indefinitely. He will receive Lupron after May 29, 2017.  After that it will be repeated every 4 months.  4. Nausea: No issues reported since last visit.  5.  Anorexia: His weight is stable and appetite is improving.  6.  Weakness and failure to thrive: Resolved after stopping Xtandi.  7. Follow-up: In 8 weeks to follow his progress.    Zola Button, MD 11/30/20182:41 PM

## 2017-05-05 LAB — PSA: Prostate Specific Ag, Serum: 4.7 ng/mL — ABNORMAL HIGH (ref 0.0–4.0)

## 2017-05-07 ENCOUNTER — Telehealth: Payer: Self-pay | Admitting: *Deleted

## 2017-05-07 ENCOUNTER — Other Ambulatory Visit: Payer: Self-pay | Admitting: Radiation Oncology

## 2017-05-07 DIAGNOSIS — C61 Malignant neoplasm of prostate: Secondary | ICD-10-CM

## 2017-05-07 DIAGNOSIS — C7951 Secondary malignant neoplasm of bone: Principal | ICD-10-CM

## 2017-05-07 NOTE — Telephone Encounter (Signed)
CALLED PATIENT TO INFORM OF LAB AND WEIGHT ON 05-17-17 - @ 12 PM @ [place]. ON 05-24-17- ARRIVAL TIME- 10:15 AM @ [facility], SPOKE WITH PATIENT AND HE IS AWARE OF THESE APPTS.

## 2017-05-09 DIAGNOSIS — Z6823 Body mass index (BMI) 23.0-23.9, adult: Secondary | ICD-10-CM | POA: Diagnosis not present

## 2017-05-09 DIAGNOSIS — L4059 Other psoriatic arthropathy: Secondary | ICD-10-CM | POA: Diagnosis not present

## 2017-05-10 ENCOUNTER — Encounter: Payer: Self-pay | Admitting: *Deleted

## 2017-05-16 ENCOUNTER — Other Ambulatory Visit: Payer: Self-pay | Admitting: Radiation Oncology

## 2017-05-16 DIAGNOSIS — C7951 Secondary malignant neoplasm of bone: Secondary | ICD-10-CM

## 2017-05-16 DIAGNOSIS — C7952 Secondary malignant neoplasm of bone marrow: Principal | ICD-10-CM

## 2017-05-17 ENCOUNTER — Ambulatory Visit
Admission: RE | Admit: 2017-05-17 | Discharge: 2017-05-17 | Disposition: A | Discharge status: Home / Self Care | Payer: Medicare Other | Source: Ambulatory Visit | Attending: Radiation Oncology | Admitting: Radiation Oncology

## 2017-05-17 DIAGNOSIS — Z8041 Family history of malignant neoplasm of ovary: Secondary | ICD-10-CM | POA: Diagnosis not present

## 2017-05-17 DIAGNOSIS — Z87891 Personal history of nicotine dependence: Secondary | ICD-10-CM | POA: Diagnosis not present

## 2017-05-17 DIAGNOSIS — C7951 Secondary malignant neoplasm of bone: Secondary | ICD-10-CM | POA: Diagnosis not present

## 2017-05-17 DIAGNOSIS — L4050 Arthropathic psoriasis, unspecified: Secondary | ICD-10-CM | POA: Diagnosis not present

## 2017-05-17 DIAGNOSIS — Z79818 Long term (current) use of other agents affecting estrogen receptors and estrogen levels: Secondary | ICD-10-CM | POA: Diagnosis not present

## 2017-05-17 DIAGNOSIS — Z8546 Personal history of malignant neoplasm of prostate: Secondary | ICD-10-CM | POA: Diagnosis not present

## 2017-05-17 DIAGNOSIS — C7952 Secondary malignant neoplasm of bone marrow: Principal | ICD-10-CM

## 2017-05-17 LAB — CBC WITH DIFFERENTIAL/PLATELET
BASO%: 0.9 % (ref 0.0–2.0)
BASOS ABS: 0 10*3/uL (ref 0.0–0.1)
EOS%: 2.7 % (ref 0.0–7.0)
Eosinophils Absolute: 0.1 10*3/uL (ref 0.0–0.5)
HCT: 40.7 % (ref 38.4–49.9)
HGB: 13.8 g/dL (ref 13.0–17.1)
LYMPH%: 15.6 % (ref 14.0–49.0)
MCH: 32 pg (ref 27.2–33.4)
MCHC: 33.9 g/dL (ref 32.0–36.0)
MCV: 94.5 fL (ref 79.3–98.0)
MONO#: 0.6 10*3/uL (ref 0.1–0.9)
MONO%: 10.6 % (ref 0.0–14.0)
NEUT#: 3.7 10*3/uL (ref 1.5–6.5)
NEUT%: 70.2 % (ref 39.0–75.0)
PLATELETS: 262 10*3/uL (ref 140–400)
RBC: 4.31 10*6/uL (ref 4.20–5.82)
RDW: 13.9 % (ref 11.0–14.6)
WBC: 5.3 10*3/uL (ref 4.0–10.3)
lymph#: 0.8 10*3/uL — ABNORMAL LOW (ref 0.9–3.3)

## 2017-05-23 ENCOUNTER — Telehealth: Payer: Self-pay | Admitting: *Deleted

## 2017-05-23 NOTE — Telephone Encounter (Signed)
CALLED PATIENT TO REMIND OF XOFIGO INJ. FOR 05-24-17 @ 10:30 AM, LVM FOR A RETURN CALL

## 2017-05-24 ENCOUNTER — Ambulatory Visit (HOSPITAL_COMMUNITY): Payer: Medicare Other

## 2017-05-24 ENCOUNTER — Ambulatory Visit (HOSPITAL_COMMUNITY)
Admission: RE | Admit: 2017-05-24 | Discharge: 2017-05-24 | Disposition: A | Discharge status: Home / Self Care | Payer: Medicare Other | Source: Ambulatory Visit | Attending: Radiation Oncology | Admitting: Radiation Oncology

## 2017-05-24 DIAGNOSIS — C61 Malignant neoplasm of prostate: Secondary | ICD-10-CM | POA: Diagnosis not present

## 2017-05-24 DIAGNOSIS — C7951 Secondary malignant neoplasm of bone: Secondary | ICD-10-CM | POA: Insufficient documentation

## 2017-05-24 MED ORDER — RADIUM RA 223 DICHLORIDE 30 MCCI/ML IV SOLN
102.7000 | Freq: Once | INTRAVENOUS | Status: AC
  Administered 2017-05-24: 102.7 via INTRAVENOUS

## 2017-05-24 NOTE — Progress Notes (Signed)
  Radiation Oncology         [phone]) [phone] ________________________________  Name: Lucas Holloway MRN: [id number]  Date: 05/24/2017  DOB: 1940/09/08  Radium-223 Infusion Note  Diagnosis:  Castration resistant prostate cancer with bone involvement  Current Infusion:    2  Planned Infusions:  6  Narrative: [name] presented to nuclear medicine for treatment. His most recent blood counts were reviewed.  He remains a good candidate to proceed with Ra-223.  The patient was situated in an infusion suite with a contact barrier placed under his arm. Intravenous access was established, using sterile technique, and a normal saline infusion from a syringe was started.  Micro-dosimetry:  The prescribed radiation activity was assayed and confirmed to be within specified tolerance.  Special Treatment Procedure - Infusion:  The nuclear medicine technologist and I personally verified the dose activity to be delivered as specified in the written directive, and verified the patient identification via 2 separate methods.  The syringe containing the dose was attached to a 3 way stopcock, and then the valve was opened to the patient, and the dose delivered over a minute. No complications were noted.  The total administered dose was 107 microcuries in a volume of 4.18 cc.   A saline flush of the line and the syringe that contained the isotope was then performed.  The residual radioactivity in the syringe was 4.3 microcuries, so the actual infused isotope activity was 102.7 microcuries.   Pressure was applied to the venipuncture site, and a compression bandage placed.   Radiation Safety personnel were present to perform the discharge survey, as detailed on their documentation.   After a short period of observation, the patient had his IV removed.  Impression:  The patient tolerated his infusion relatively well.  Plan:  The patient will return in one month for ongoing care.     ________________________________  Sheral Apley. Tammi Klippel, M.D.  This document serves as a record of services personally performed by Tyler Pita, MD. It was created on his behalf by Valeta Harms, a trained medical scribe. The creation of this record is based on the scribe's personal observations and the provider's statements to them. This document has been checked and approved by the attending provider.

## 2017-05-25 ENCOUNTER — Other Ambulatory Visit: Payer: Self-pay | Admitting: Radiation Oncology

## 2017-05-25 ENCOUNTER — Telehealth: Payer: Self-pay | Admitting: *Deleted

## 2017-05-25 DIAGNOSIS — C7951 Secondary malignant neoplasm of bone: Principal | ICD-10-CM

## 2017-05-25 DIAGNOSIS — C61 Malignant neoplasm of prostate: Secondary | ICD-10-CM

## 2017-05-25 NOTE — Telephone Encounter (Signed)
CALLED PATIENT TO INFORM OF LABS AND WEIGHT ON 06-21-17 @ 12 PM @ [place]. FOR 06-28-17 - ARRIVAL TIME - 9:45 AM @ [facility], LVM FOR A RETURN CALL

## 2017-06-01 ENCOUNTER — Other Ambulatory Visit (HOSPITAL_BASED_OUTPATIENT_CLINIC_OR_DEPARTMENT_OTHER): Payer: Medicare Other

## 2017-06-01 ENCOUNTER — Ambulatory Visit (HOSPITAL_BASED_OUTPATIENT_CLINIC_OR_DEPARTMENT_OTHER): Payer: Medicare Other

## 2017-06-01 VITALS — BP 138/76 | HR 72 | Temp 98.0°F | Resp 18

## 2017-06-01 DIAGNOSIS — Z5111 Encounter for antineoplastic chemotherapy: Secondary | ICD-10-CM

## 2017-06-01 DIAGNOSIS — C7951 Secondary malignant neoplasm of bone: Secondary | ICD-10-CM

## 2017-06-01 DIAGNOSIS — C61 Malignant neoplasm of prostate: Secondary | ICD-10-CM

## 2017-06-01 LAB — COMPREHENSIVE METABOLIC PANEL
ALBUMIN: 3.3 g/dL — AB (ref 3.5–5.0)
ALK PHOS: 94 U/L (ref 40–150)
ALT: 20 U/L (ref 0–55)
AST: 29 U/L (ref 5–34)
Anion Gap: 8 mEq/L (ref 3–11)
BUN: 20.6 mg/dL (ref 7.0–26.0)
CALCIUM: 9.1 mg/dL (ref 8.4–10.4)
CO2: 23 mEq/L (ref 22–29)
Chloride: 94 mEq/L — ABNORMAL LOW (ref 98–109)
Creatinine: 0.7 mg/dL (ref 0.7–1.3)
Glucose: 92 mg/dl (ref 70–140)
POTASSIUM: 4.6 meq/L (ref 3.5–5.1)
Sodium: 125 mEq/L — ABNORMAL LOW (ref 136–145)
Total Bilirubin: 0.4 mg/dL (ref 0.20–1.20)
Total Protein: 6.7 g/dL (ref 6.4–8.3)

## 2017-06-01 LAB — CBC WITH DIFFERENTIAL/PLATELET
BASO%: 1.1 % (ref 0.0–2.0)
Basophils Absolute: 0.1 10*3/uL (ref 0.0–0.1)
EOS%: 2.7 % (ref 0.0–7.0)
Eosinophils Absolute: 0.2 10*3/uL (ref 0.0–0.5)
HCT: 39.6 % (ref 38.4–49.9)
HGB: 13.4 g/dL (ref 13.0–17.1)
LYMPH%: 14.1 % (ref 14.0–49.0)
MCH: 31.8 pg (ref 27.2–33.4)
MCHC: 33.9 g/dL (ref 32.0–36.0)
MCV: 93.8 fL (ref 79.3–98.0)
MONO#: 0.6 10*3/uL (ref 0.1–0.9)
MONO%: 10.6 % (ref 0.0–14.0)
NEUT%: 71.5 % (ref 39.0–75.0)
NEUTROS ABS: 4 10*3/uL (ref 1.5–6.5)
PLATELETS: 263 10*3/uL (ref 140–400)
RBC: 4.22 10*6/uL (ref 4.20–5.82)
RDW: 13.9 % (ref 11.0–14.6)
WBC: 5.7 10*3/uL (ref 4.0–10.3)
lymph#: 0.8 10*3/uL — ABNORMAL LOW (ref 0.9–3.3)

## 2017-06-01 MED ORDER — LEUPROLIDE ACETATE (4 MONTH) 30 MG IM KIT
30.0000 mg | PACK | Freq: Once | INTRAMUSCULAR | Status: AC
  Administered 2017-06-01: 30 mg via INTRAMUSCULAR
  Filled 2017-06-01: qty 30

## 2017-06-01 MED ORDER — DENOSUMAB 120 MG/1.7ML ~~LOC~~ SOLN
120.0000 mg | Freq: Once | SUBCUTANEOUS | Status: AC
  Administered 2017-06-01: 120 mg via SUBCUTANEOUS

## 2017-06-01 NOTE — Patient Instructions (Signed)
Leuprolide depot injection What is this medicine? LEUPROLIDE (loo PROE lide) is a man-made protein that acts like a natural hormone in the body. It decreases testosterone in men and decreases estrogen in women. In men, this medicine is used to treat advanced prostate cancer. In women, some forms of this medicine may be used to treat endometriosis, uterine fibroids, or other male hormone-related problems. This medicine may be used for other purposes; ask your health care provider or pharmacist if you have questions. COMMON BRAND NAME(S): Eligard, Lupron Depot, Lupron Depot-Ped, Viadur What should I tell my health care provider before I take this medicine? They need to know if you have any of these conditions: -diabetes -heart disease or previous heart attack -high blood pressure -high cholesterol -mental illness -osteoporosis -pain or difficulty passing urine -seizures -spinal cord metastasis -stroke -suicidal thoughts, plans, or attempt; a previous suicide attempt by you or a family member -tobacco smoker -unusual vaginal bleeding (women) -an unusual or allergic reaction to leuprolide, benzyl alcohol, other medicines, foods, dyes, or preservatives -pregnant or trying to get pregnant -breast-feeding How should I use this medicine? This medicine is for injection into a muscle or for injection under the skin. It is given by a health care professional in a hospital or clinic setting. The specific product will determine how it will be given to you. Make sure you understand which product you receive and how often you will receive it. Talk to your pediatrician regarding the use of this medicine in children. Special care may be needed. Overdosage: If you think you have taken too much of this medicine contact a poison control center or emergency room at once. NOTE: This medicine is only for you. Do not share this medicine with others. What if I miss a dose? It is important not to miss a dose.  Call your doctor or health care professional if you are unable to keep an appointment. Depot injections: Depot injections are given either once-monthly, every 12 weeks, every 16 weeks, or every 24 weeks depending on the product you are prescribed. The product you are prescribed will be based on if you are male or male, and your condition. Make sure you understand your product and dosing. What may interact with this medicine? Do not take this medicine with any of the following medications: -chasteberry This medicine may also interact with the following medications: -herbal or dietary supplements, like black cohosh or DHEA -male hormones, like estrogens or progestins and birth control pills, patches, rings, or injections -male hormones, like testosterone This list may not describe all possible interactions. Give your health care provider a list of all the medicines, herbs, non-prescription drugs, or dietary supplements you use. Also tell them if you smoke, drink alcohol, or use illegal drugs. Some items may interact with your medicine. What should I watch for while using this medicine? Visit your doctor or health care professional for regular checks on your progress. During the first weeks of treatment, your symptoms may get worse, but then will improve as you continue your treatment. You may get hot flashes, increased bone pain, increased difficulty passing urine, or an aggravation of nerve symptoms. Discuss these effects with your doctor or health care professional, some of them may improve with continued use of this medicine. Male patients may experience a menstrual cycle or spotting during the first months of therapy with this medicine. If this continues, contact your doctor or health care professional. What side effects may I notice from receiving this medicine? Side   effects that you should report to your doctor or health care professional as soon as possible: -allergic reactions like skin  rash, itching or hives, swelling of the face, lips, or tongue -breathing problems -chest pain -depression or memory disorders -pain in your legs or groin -pain at site where injected or implanted -seizures -severe headache -swelling of the feet and legs -suicidal thoughts or other mood changes -visual changes -vomiting Side effects that usually do not require medical attention (report to your doctor or health care professional if they continue or are bothersome): -breast swelling or tenderness -decrease in sex drive or performance -diarrhea -hot flashes -loss of appetite -muscle, joint, or bone pains -nausea -redness or irritation at site where injected or implanted -skin problems or acne This list may not describe all possible side effects. Call your doctor for medical advice about side effects. You may report side effects to FDA at 1-800-FDA-1088. Where should I keep my medicine? This drug is given in a hospital or clinic and will not be stored at home. NOTE: This sheet is a summary. It may not cover all possible information. If you have questions about this medicine, talk to your doctor, pharmacist, or health care provider.  2018 Elsevier/Gold Standard (2015-11-04 09:45:53) Denosumab injection What is this medicine? DENOSUMAB (den oh sue mab) slows bone breakdown. Prolia is used to treat osteoporosis in women after menopause and in men. Delton See is used to prevent bone fractures and other bone problems caused by cancer bone metastases. Delton See is also used to treat giant cell tumor of the bone. This medicine may be used for other purposes; ask your health care provider or pharmacist if you have questions. What should I tell my health care provider before I take this medicine? They need to know if you have any of these conditions: -dental disease -eczema -infection or history of infections -kidney disease or on dialysis -low blood calcium or vitamin D -malabsorption  syndrome -scheduled to have surgery or tooth extraction -taking medicine that contains denosumab -thyroid or parathyroid disease -an unusual reaction to denosumab, other medicines, foods, dyes, or preservatives -pregnant or trying to get pregnant -breast-feeding How should I use this medicine? This medicine is for injection under the skin. It is given by a health care professional in a hospital or clinic setting. If you are getting Prolia, a special MedGuide will be given to you by the pharmacist with each prescription and refill. Be sure to read this information carefully each time. For Prolia, talk to your pediatrician regarding the use of this medicine in children. Special care may be needed. For Delton See, talk to your pediatrician regarding the use of this medicine in children. While this drug may be prescribed for children as young as 13 years for selected conditions, precautions do apply. Overdosage: If you think you have taken too much of this medicine contact a poison control center or emergency room at once. NOTE: This medicine is only for you. Do not share this medicine with others. What if I miss a dose? It is important not to miss your dose. Call your doctor or health care professional if you are unable to keep an appointment. What may interact with this medicine? Do not take this medicine with any of the following medications: -other medicines containing denosumab This medicine may also interact with the following medications: -medicines that suppress the immune system -medicines that treat cancer -steroid medicines like prednisone or cortisone This list may not describe all possible interactions. Give your health care  provider a list of all the medicines, herbs, non-prescription drugs, or dietary supplements you use. Also tell them if you smoke, drink alcohol, or use illegal drugs. Some items may interact with your medicine. What should I watch for while using this medicine? Visit  your doctor or health care professional for regular checks on your progress. Your doctor or health care professional may order blood tests and other tests to see how you are doing. Call your doctor or health care professional if you get a cold or other infection while receiving this medicine. Do not treat yourself. This medicine may decrease your body's ability to fight infection. You should make sure you get enough calcium and vitamin D while you are taking this medicine, unless your doctor tells you not to. Discuss the foods you eat and the vitamins you take with your health care professional. See your dentist regularly. Brush and floss your teeth as directed. Before you have any dental work done, tell your dentist you are receiving this medicine. Do not become pregnant while taking this medicine or for 5 months after stopping it. Women should inform their doctor if they wish to become pregnant or think they might be pregnant. There is a potential for serious side effects to an unborn child. Talk to your health care professional or pharmacist for more information. What side effects may I notice from receiving this medicine? Side effects that you should report to your doctor or health care professional as soon as possible: -allergic reactions like skin rash, itching or hives, swelling of the face, lips, or tongue -breathing problems -chest pain -fast, irregular heartbeat -feeling faint or lightheaded, falls -fever, chills, or any other sign of infection -muscle spasms, tightening, or twitches -numbness or tingling -skin blisters or bumps, or is dry, peels, or red -slow healing or unexplained pain in the mouth or jaw -unusual bleeding or bruising Side effects that usually do not require medical attention (Report these to your doctor or health care professional if they continue or are bothersome.): -muscle pain -stomach upset, gas This list may not describe all possible side effects. Call your  doctor for medical advice about side effects. You may report side effects to FDA at 1-800-FDA-1088. Where should I keep my medicine? This medicine is only given in a clinic, doctor's office, or other health care setting and will not be stored at home. NOTE: This sheet is a summary. It may not cover all possible information. If you have questions about this medicine, talk to your doctor, pharmacist, or health care provider.    2016, Elsevier/Gold Standard. (2011-11-20 12:37:47)

## 2017-06-02 LAB — PSA: PROSTATE SPECIFIC AG, SERUM: 7.4 ng/mL — AB (ref 0.0–4.0)

## 2017-06-04 ENCOUNTER — Ambulatory Visit (HOSPITAL_COMMUNITY): Payer: Medicare Other

## 2017-06-06 DIAGNOSIS — E039 Hypothyroidism, unspecified: Secondary | ICD-10-CM | POA: Diagnosis not present

## 2017-06-06 DIAGNOSIS — E782 Mixed hyperlipidemia: Secondary | ICD-10-CM | POA: Diagnosis not present

## 2017-06-06 DIAGNOSIS — E559 Vitamin D deficiency, unspecified: Secondary | ICD-10-CM | POA: Diagnosis not present

## 2017-06-08 DIAGNOSIS — E039 Hypothyroidism, unspecified: Secondary | ICD-10-CM | POA: Diagnosis not present

## 2017-06-08 DIAGNOSIS — E559 Vitamin D deficiency, unspecified: Secondary | ICD-10-CM | POA: Diagnosis not present

## 2017-06-08 DIAGNOSIS — E782 Mixed hyperlipidemia: Secondary | ICD-10-CM | POA: Diagnosis not present

## 2017-06-08 DIAGNOSIS — Z6823 Body mass index (BMI) 23.0-23.9, adult: Secondary | ICD-10-CM | POA: Diagnosis not present

## 2017-06-14 DIAGNOSIS — E871 Hypo-osmolality and hyponatremia: Secondary | ICD-10-CM | POA: Diagnosis not present

## 2017-06-14 DIAGNOSIS — E860 Dehydration: Secondary | ICD-10-CM | POA: Diagnosis not present

## 2017-06-20 ENCOUNTER — Telehealth: Payer: Self-pay | Admitting: *Deleted

## 2017-06-20 ENCOUNTER — Other Ambulatory Visit: Payer: Self-pay | Admitting: Radiation Oncology

## 2017-06-20 DIAGNOSIS — C7951 Secondary malignant neoplasm of bone: Secondary | ICD-10-CM

## 2017-06-20 DIAGNOSIS — C7952 Secondary malignant neoplasm of bone marrow: Principal | ICD-10-CM

## 2017-06-20 NOTE — Telephone Encounter (Signed)
Called patient to inform of labs and weight for 06-21-17 @ 12 pm, spoke with patient and he is aware of this appt.

## 2017-06-21 ENCOUNTER — Encounter: Payer: Self-pay | Admitting: Radiation Oncology

## 2017-06-21 ENCOUNTER — Ambulatory Visit
Admission: RE | Admit: 2017-06-21 | Discharge: 2017-06-21 | Disposition: A | Discharge status: Home / Self Care | Payer: Medicare Other | Source: Ambulatory Visit | Attending: Radiation Oncology | Admitting: Radiation Oncology

## 2017-06-21 DIAGNOSIS — L4050 Arthropathic psoriasis, unspecified: Secondary | ICD-10-CM | POA: Diagnosis not present

## 2017-06-21 DIAGNOSIS — Z79899 Other long term (current) drug therapy: Secondary | ICD-10-CM | POA: Diagnosis not present

## 2017-06-21 DIAGNOSIS — Z8041 Family history of malignant neoplasm of ovary: Secondary | ICD-10-CM | POA: Diagnosis not present

## 2017-06-21 DIAGNOSIS — Z6823 Body mass index (BMI) 23.0-23.9, adult: Secondary | ICD-10-CM | POA: Diagnosis not present

## 2017-06-21 DIAGNOSIS — C7952 Secondary malignant neoplasm of bone marrow: Principal | ICD-10-CM

## 2017-06-21 DIAGNOSIS — C7951 Secondary malignant neoplasm of bone: Secondary | ICD-10-CM

## 2017-06-21 DIAGNOSIS — Z8546 Personal history of malignant neoplasm of prostate: Secondary | ICD-10-CM | POA: Diagnosis not present

## 2017-06-21 DIAGNOSIS — Z79818 Long term (current) use of other agents affecting estrogen receptors and estrogen levels: Secondary | ICD-10-CM | POA: Diagnosis not present

## 2017-06-21 DIAGNOSIS — C61 Malignant neoplasm of prostate: Secondary | ICD-10-CM | POA: Diagnosis not present

## 2017-06-21 DIAGNOSIS — B354 Tinea corporis: Secondary | ICD-10-CM | POA: Diagnosis not present

## 2017-06-21 DIAGNOSIS — E871 Hypo-osmolality and hyponatremia: Secondary | ICD-10-CM | POA: Diagnosis not present

## 2017-06-21 DIAGNOSIS — Z87891 Personal history of nicotine dependence: Secondary | ICD-10-CM | POA: Diagnosis not present

## 2017-06-21 DIAGNOSIS — Z79890 Hormone replacement therapy (postmenopausal): Secondary | ICD-10-CM | POA: Diagnosis not present

## 2017-06-21 LAB — CBC WITH DIFFERENTIAL (CANCER CENTER ONLY)
BASOS ABS: 0 10*3/uL (ref 0.0–0.1)
BASOS PCT: 1 %
EOS ABS: 0.2 10*3/uL (ref 0.0–0.5)
Eosinophils Relative: 4 %
HCT: 39.1 % (ref 38.4–49.9)
HEMOGLOBIN: 13.7 g/dL (ref 13.0–17.1)
Lymphocytes Relative: 18 %
Lymphs Abs: 1.1 10*3/uL (ref 0.9–3.3)
MCH: 32.2 pg (ref 27.2–33.4)
MCHC: 35 g/dL (ref 32.0–36.0)
MCV: 91.8 fL (ref 79.3–98.0)
Monocytes Absolute: 0.6 10*3/uL (ref 0.1–0.9)
Monocytes Relative: 10 %
NEUTROS PCT: 67 %
Neutro Abs: 4 10*3/uL (ref 1.5–6.5)
Platelet Count: 247 10*3/uL (ref 140–400)
RBC: 4.26 MIL/uL (ref 4.20–5.82)
RDW: 13.4 % (ref 11.0–15.6)
WBC: 6 10*3/uL (ref 4.0–10.3)

## 2017-06-21 NOTE — Progress Notes (Signed)
   Patient presented to the clinic today to be weighed. Patients weight  needed prior to [facility] on 06/28/2017. Patient requested this RN assess his back for new onset rash since 05/24/2018. Patient denies this rash is present anywhere else on his body. Patient denies the rash is painful. Patient reports the rash only itches if he wears a shirt that isn't cotton. Patient scheduled to see PCP, Dr Ernie Hew, today at 3 pm. Encouraged patient to follow up with Dr. Ernie Hew since she initially assessed the rash. Attempted to reassure patient that we don't believe this rash is associated with the xofigo injections. Will check in with patient via phone on Monday, January 21st to assess status prior to injection on 06/28/2017. Patient left the clinic ambulatory in no distress.

## 2017-06-25 ENCOUNTER — Telehealth: Payer: Self-pay | Admitting: Radiation Oncology

## 2017-06-25 NOTE — Telephone Encounter (Signed)
Patient scheduled for xofigo injection on Thursday, 06/28/2017. Phoned patient to inquire about status of rash on back. Patient reports the rash is still present. Patient reports the rash is no worse or better. Patient reports the rash has not spread. Patient reports that he has a follow up appointment with Dr. Ernie Hew this morning. Phoned Sabrina at Dr. Ival Bible office [phone]) and requested her last two office notes.

## 2017-06-27 ENCOUNTER — Telehealth: Payer: Self-pay | Admitting: *Deleted

## 2017-06-27 NOTE — Telephone Encounter (Signed)
Called patient to remind of Xofigo Inj. for 06-28-17 - arrival time - 9:45 am @ [facility], spoke with patient and he is aware of this inj.

## 2017-06-28 ENCOUNTER — Encounter (HOSPITAL_COMMUNITY)
Admission: RE | Admit: 2017-06-28 | Discharge: 2017-06-28 | Disposition: A | Discharge status: Home / Self Care | Payer: Medicare Other | Source: Ambulatory Visit | Attending: Radiation Oncology | Admitting: Radiation Oncology

## 2017-06-28 DIAGNOSIS — C7951 Secondary malignant neoplasm of bone: Secondary | ICD-10-CM | POA: Diagnosis not present

## 2017-06-28 DIAGNOSIS — C61 Malignant neoplasm of prostate: Secondary | ICD-10-CM | POA: Insufficient documentation

## 2017-06-28 MED ORDER — RADIUM RA 223 DICHLORIDE 30 MCCI/ML IV SOLN: 106.5000 | Freq: Once | INTRAVENOUS | Status: DC

## 2017-06-28 MED ORDER — RADIUM RA 223 DICHLORIDE 30 MCCI/ML IV SOLN
104.2000 | Freq: Once | INTRAVENOUS | Status: AC
  Administered 2017-06-28: 104.2 via INTRAVENOUS

## 2017-06-28 NOTE — Progress Notes (Signed)
  Radiation Oncology         [phone] ________________________________  Name: Lucas Holloway MRN: [id number]  Date: 06/28/2017  DOB: Jul 10, 1940  Radium-223 Infusion Note  Diagnosis:  Castration resistant prostate cancer with painful bone involvement  Current Infusion:    3  Planned Infusions:  6  Narrative: [name] presented to nuclear medicine for treatment. His most recent blood counts were reviewed.  He remains a good candidate to proceed with Ra-223.  The patient was situated in an infusion suite with a contact barrier placed under his arm. Intravenous access was established, using sterile technique, and a normal saline infusion from a syringe was started.  Micro-dosimetry:  The prescribed radiation activity was assayed and confirmed to be within specified tolerance.  Special Treatment Procedure - Infusion:  The nuclear medicine technologist and I personally verified the dose activity to be delivered as specified in the written directive, and verified the patient identification via 2 separate methods.  The syringe containing the dose was attached to a 3 way stopcock, and then the valve was opened to the patient, and the dose delivered over a minute. No complications were noted.  The total administered dose was 106.5 microcuries in a volume of 8 cc.   A saline flush of the line and the syringe that contained the isotope was then performed.  The residual radioactivity in the syringe was 2.28 microcuries, so the actual infused isotope activity was 104.22 microcuries.   Pressure was applied to the venipuncture site, and a compression bandage placed.   Radiation Safety personnel were present to perform the discharge survey, as detailed on their documentation.   After a short period of observation, the patient had his IV removed.  Impression:  The patient tolerated his infusion relatively well.  Plan:  The patient will return in one month for ongoing care.      ________________________________  Sheral Apley. Tammi Klippel, M.D.  This document serves as a record of services personally performed by Tyler Pita, MD. It was created on his behalf by Rae Lips, a trained medical scribe. The creation of this record is based on the scribe's personal observations and the provider's statements to them. This document has been checked and approved by the attending provider.

## 2017-07-02 ENCOUNTER — Telehealth: Payer: Self-pay | Admitting: *Deleted

## 2017-07-02 ENCOUNTER — Other Ambulatory Visit: Payer: Self-pay | Admitting: Radiation Oncology

## 2017-07-02 DIAGNOSIS — C61 Malignant neoplasm of prostate: Secondary | ICD-10-CM

## 2017-07-02 DIAGNOSIS — C7951 Secondary malignant neoplasm of bone: Principal | ICD-10-CM

## 2017-07-02 NOTE — Telephone Encounter (Signed)
CALLED PATIENT TO INFORM OF LAB AND WEIGHT ON 07-26-17 @ 12 PM @ [facility]. ON 08-02-17 @ 12:30 PM, SPOKE WITH PATIENT'S WIFE RITA AND SHE IS AWARE OF THESE APPTS.

## 2017-07-03 DIAGNOSIS — L4059 Other psoriatic arthropathy: Secondary | ICD-10-CM | POA: Diagnosis not present

## 2017-07-03 DIAGNOSIS — Z6823 Body mass index (BMI) 23.0-23.9, adult: Secondary | ICD-10-CM | POA: Diagnosis not present

## 2017-07-03 DIAGNOSIS — R21 Rash and other nonspecific skin eruption: Secondary | ICD-10-CM | POA: Diagnosis not present

## 2017-07-04 ENCOUNTER — Encounter: Payer: Self-pay | Admitting: *Deleted

## 2017-07-05 DIAGNOSIS — L920 Granuloma annulare: Secondary | ICD-10-CM | POA: Diagnosis not present

## 2017-07-25 ENCOUNTER — Telehealth: Payer: Self-pay | Admitting: *Deleted

## 2017-07-25 ENCOUNTER — Other Ambulatory Visit: Payer: Self-pay | Admitting: Radiation Oncology

## 2017-07-25 DIAGNOSIS — C7952 Secondary malignant neoplasm of bone marrow: Principal | ICD-10-CM

## 2017-07-25 DIAGNOSIS — C7951 Secondary malignant neoplasm of bone: Secondary | ICD-10-CM

## 2017-07-25 NOTE — Telephone Encounter (Signed)
CALLED PATIENT TO REMIND OF LAB AND WEIGHT FOR 07-26-17 @ 12 PM, LVM FOR A RETURN CALL

## 2017-07-26 ENCOUNTER — Ambulatory Visit
Admission: RE | Admit: 2017-07-26 | Discharge: 2017-07-26 | Disposition: A | Discharge status: Home / Self Care | Payer: Medicare Other | Source: Ambulatory Visit | Attending: Radiation Oncology | Admitting: Radiation Oncology

## 2017-07-26 DIAGNOSIS — C7952 Secondary malignant neoplasm of bone marrow: Secondary | ICD-10-CM

## 2017-07-26 DIAGNOSIS — C7951 Secondary malignant neoplasm of bone: Secondary | ICD-10-CM

## 2017-07-26 LAB — CBC WITH DIFFERENTIAL (CANCER CENTER ONLY)
BASOS PCT: 0 %
Basophils Absolute: 0 10*3/uL (ref 0.0–0.1)
EOS PCT: 3 %
Eosinophils Absolute: 0.2 10*3/uL (ref 0.0–0.5)
HCT: 39 % (ref 38.4–49.9)
Hemoglobin: 13.5 g/dL (ref 13.0–17.1)
LYMPHS ABS: 0.9 10*3/uL (ref 0.9–3.3)
Lymphocytes Relative: 13 %
MCH: 31.2 pg (ref 27.2–33.4)
MCHC: 34.6 g/dL (ref 32.0–36.0)
MCV: 90.1 fL (ref 79.3–98.0)
MONOS PCT: 15 %
Monocytes Absolute: 1 10*3/uL — ABNORMAL HIGH (ref 0.1–0.9)
Neutro Abs: 4.8 10*3/uL (ref 1.5–6.5)
Neutrophils Relative %: 69 %
Platelet Count: 228 10*3/uL (ref 140–400)
RBC: 4.33 MIL/uL (ref 4.20–5.82)
RDW: 13 % (ref 11.0–14.6)
WBC: 6.9 10*3/uL (ref 4.0–10.3)

## 2017-08-01 ENCOUNTER — Telehealth: Payer: Self-pay | Admitting: *Deleted

## 2017-08-01 NOTE — Telephone Encounter (Signed)
Called patient to remind of xofigo inj. for 08-02-17, spoke with patient and he is aware of this inj.

## 2017-08-02 ENCOUNTER — Ambulatory Visit (HOSPITAL_COMMUNITY)
Admission: RE | Admit: 2017-08-02 | Discharge: 2017-08-02 | Disposition: A | Discharge status: Home / Self Care | Payer: Medicare Other | Source: Ambulatory Visit | Attending: Radiation Oncology | Admitting: Radiation Oncology

## 2017-08-02 DIAGNOSIS — C7951 Secondary malignant neoplasm of bone: Secondary | ICD-10-CM | POA: Insufficient documentation

## 2017-08-02 DIAGNOSIS — C61 Malignant neoplasm of prostate: Secondary | ICD-10-CM | POA: Diagnosis not present

## 2017-08-02 MED ORDER — RADIUM RA 223 DICHLORIDE 30 MCCI/ML IV SOLN
103.5000 | Freq: Once | INTRAVENOUS | Status: AC
  Administered 2017-08-02: 103.5 via INTRAVENOUS

## 2017-08-02 NOTE — Progress Notes (Signed)
  Radiation Oncology         [phone]) [phone] ________________________________  Name: Lucas Holloway MRN: [id number]  Date: 08/02/2017  DOB: April 19, 1941  Radium-223 Infusion Note  Diagnosis:  Castration resistant prostate cancer with painful bone involvement  Current Infusion:    4  Planned Infusions:  6  Narrative: Mr. [name] presented to nuclear medicine for treatment. His most recent blood counts were reviewed.  He remains a good candidate to proceed with Ra-223.  The patient was situated in an infusion suite with a contact barrier placed under his arm. Intravenous access was established, using sterile technique, and a normal saline infusion from a syringe was started.  Micro-dosimetry:  The prescribed radiation activity was assayed and confirmed to be within specified tolerance.  Special Treatment Procedure - Infusion:  The nuclear medicine technologist and I personally verified the dose activity to be delivered as specified in the written directive, and verified the patient identification via 2 separate methods.  The syringe containing the dose was attached to a 3 way stopcock, and then the valve was opened to the patient, and the dose delivered over a minute. No complications were noted.  The total administered dose was 106.8 microcuries in a volume of  5 cc.   A saline flush of the line and the syringe that contained the isotope was then performed.  The residual radioactivity in the syringe was 3.32 microcuries, so the actual infused isotope activity was 103.48 microcuries.   Pressure was applied to the venipuncture site, and a compression bandage placed.   Radiation Safety personnel were present to perform the discharge survey, as detailed on their documentation.   After a short period of observation, the patient had his IV removed.  Impression:  The patient tolerated his infusion relatively well.  Plan:  The patient will return in one month for ongoing care.      ________________________________  Sheral Apley. Tammi Klippel, M.D.  This document serves as a record of services personally performed by Tyler Pita, MD. It was created on his behalf by Bethann Humble, a trained medical scribe. The creation of this record is based on the scribe's personal observations and the provider's statements to them. This document has been checked and approved by the attending provider.

## 2017-08-03 ENCOUNTER — Inpatient Hospital Stay: Payer: Medicare Other

## 2017-08-03 ENCOUNTER — Telehealth: Payer: Self-pay | Admitting: Oncology

## 2017-08-03 ENCOUNTER — Inpatient Hospital Stay: Payer: Medicare Other | Attending: Oncology | Admitting: Oncology

## 2017-08-03 VITALS — BP 107/62 | HR 82 | Temp 96.9°F | Resp 18 | Wt 153.8 lb

## 2017-08-03 DIAGNOSIS — Z79899 Other long term (current) drug therapy: Secondary | ICD-10-CM | POA: Diagnosis not present

## 2017-08-03 DIAGNOSIS — C7951 Secondary malignant neoplasm of bone: Secondary | ICD-10-CM | POA: Insufficient documentation

## 2017-08-03 DIAGNOSIS — C61 Malignant neoplasm of prostate: Secondary | ICD-10-CM | POA: Diagnosis not present

## 2017-08-03 DIAGNOSIS — R2681 Unsteadiness on feet: Secondary | ICD-10-CM | POA: Diagnosis not present

## 2017-08-03 DIAGNOSIS — R630 Anorexia: Secondary | ICD-10-CM | POA: Insufficient documentation

## 2017-08-03 DIAGNOSIS — Z192 Hormone resistant malignancy status: Secondary | ICD-10-CM

## 2017-08-03 LAB — CBC WITH DIFFERENTIAL/PLATELET
BASOS ABS: 0 10*3/uL (ref 0.0–0.1)
Basophils Relative: 1 %
EOS ABS: 0.1 10*3/uL (ref 0.0–0.5)
Eosinophils Relative: 3 %
HEMATOCRIT: 39.8 % (ref 38.4–49.9)
HEMOGLOBIN: 13.9 g/dL (ref 13.0–17.1)
Lymphocytes Relative: 14 %
Lymphs Abs: 0.6 10*3/uL — ABNORMAL LOW (ref 0.9–3.3)
MCH: 30.9 pg (ref 27.2–33.4)
MCHC: 34.9 g/dL (ref 32.0–36.0)
MCV: 88.4 fL (ref 79.3–98.0)
Monocytes Absolute: 0.5 10*3/uL (ref 0.1–0.9)
Monocytes Relative: 10 %
NEUTROS ABS: 3.1 10*3/uL (ref 1.5–6.5)
NEUTROS PCT: 72 %
Platelets: 258 10*3/uL (ref 140–400)
RBC: 4.5 MIL/uL (ref 4.20–5.82)
RDW: 13.1 % (ref 11.0–14.6)
WBC: 4.3 10*3/uL (ref 4.0–10.3)

## 2017-08-03 LAB — COMPREHENSIVE METABOLIC PANEL
ALBUMIN: 3.1 g/dL — AB (ref 3.5–5.0)
ALK PHOS: 129 U/L (ref 40–150)
ALT: 19 U/L (ref 0–55)
AST: 24 U/L (ref 5–34)
Anion gap: 10 (ref 3–11)
BILIRUBIN TOTAL: 0.4 mg/dL (ref 0.2–1.2)
BUN: 21 mg/dL (ref 7–26)
CALCIUM: 9.4 mg/dL (ref 8.4–10.4)
CO2: 22 mmol/L (ref 22–29)
Chloride: 93 mmol/L — ABNORMAL LOW (ref 98–109)
Creatinine, Ser: 0.7 mg/dL (ref 0.70–1.30)
GFR calc Af Amer: >60 mL/min (ref >60)
GFR calc non Af Amer: >60 mL/min (ref >60)
GLUCOSE: 101 mg/dL (ref 70–140)
POTASSIUM: 4.6 mmol/L (ref 3.5–5.1)
SODIUM: 125 mmol/L — AB (ref 136–145)
Total Protein: 6.6 g/dL (ref 6.4–8.3)

## 2017-08-03 MED ORDER — DENOSUMAB 120 MG/1.7ML ~~LOC~~ SOLN
120.0000 mg | Freq: Once | SUBCUTANEOUS | Status: AC
  Administered 2017-08-03: 120 mg via SUBCUTANEOUS

## 2017-08-03 NOTE — Progress Notes (Signed)
Hematology and Oncology Follow Up Visit  Lucas Holloway [id number] [birth year] 77 y.o. 08/03/2017 1:24 PM [name], MDDewey, [name], MD   Principle Diagnosis: 77 year old man with advanced castration-resistant prostate cancer initially diagnosed in 2009. He had a Gleason score 6 and a PSA of 9. He has  metastatic disease to the bone.   Prior Therapy: He was treated with brachytherapy with excellent PSA response initially. His PSA nadir was 0.7 and 2013.   He developed metastases in October 2016 with bone metastasis. He was started on androgen deprivation under the care of Dr. [name] and his PSA did drop down initially to 0.61 in May 2017. He developed castration resistant disease in 2017. Zytiga 1000 mg daily with prednisone 5 mg daily started on 01/07/2016. Therapy discontinued and 2018 because of progression of disease. Xtandi 160 mg daily started in August 2018. The dose was reduced to 80 mg daily starting on 02/12/2017 for better tolerance.  Therapy discontinued in October 2018 because of poor tolerance and progression of disease.  Current therapy:  Xofigo infusion on a monthly basis started on May 04, 2017.  He completed 4 out of a planned 6 treatments. He is receiving Xgeva every 8 weeks. He is currently on Lupron at 30 mg every 4 months.  Interim History: Lucas Holloway here for a follow-up visit. Since the last visit, he continues to tolerate Xofigo without complications. He received 4 out of planned 6 injections without any complications. He denied excessive fatigue or tiredness. His bone pain has improved overall. He denied any pelvic discomfort or shoulder pain. He continues to have issues with unsteadiness and balance issues. He denied any neurological deficits including weakness or seizures.  His appetite has been reasonable but he is having difficulty getting adequate nutrition because of lack of lower teeth. He is trying to use nutritional supplements and has been using it  twice a day.  He does not report any headaches, blurry vision, syncope. He does not report any fevers or chills or sweats. He has lost close to 6 pounds. He does not report any cough, wheezing or hemoptysis. He does not report any chest pain, palpitation orthopnea. He does not report any nausea, vomiting or abdominal pain. He does not report any frequency urgency or hesitancy. He does not report any skeletal complaints of arthralgias myalgias. He does not report any lymphadenopathy or petechiae. He does not report any skin rashes or lesions. Remaining review of systems is negative.  Medications: I have reviewed the patient's current medications.  Current Outpatient Medications  Medication Sig Dispense Refill  . calcium-vitamin D (OSCAL WITH D) 500-200 MG-UNIT per tablet Take 1 tablet by mouth 2 (two) times daily.    . Degarelix Acetate (FIRMAGON [place]) One injection a month    . Denosumab (XGEVA [place]) One injection a month    . enzalutamide (XTANDI) 40 MG capsule Take 4 capsules (160 mg total) by mouth daily. (Patient not taking: Reported on 04/02/2017) 120 capsule 0  . fish oil-omega-3 fatty acids 1000 MG capsule Take 1 g by mouth daily.     . folic acid (FOLVITE) 1 MG tablet Take 1 mg by mouth daily.    Marland Kitchen levothyroxine (SYNTHROID, LEVOTHROID) 75 MCG tablet Take 75 mcg by mouth daily before breakfast.   3  . meloxicam (MOBIC) 7.5 MG tablet Take 7.5 mg by mouth daily.    . methotrexate (RHEUMATREX) 2.5 MG tablet Take 15 mg by mouth once a week. Takes on sundays.Marland KitchenMarland KitchenCaution:Chemotherapy. Protect from  light.    . Multiple Vitamin (MULTIVITAMIN WITH MINERALS) TABS Take 1 tablet by mouth daily.    . prochlorperazine (COMPAZINE) 10 MG tablet TAKE 1 TABLET(10 MG) BY MOUTH EVERY 6 HOURS AS NEEDED FOR NAUSEA OR VOMITING (Patient not taking: Reported on 04/02/2017) 385 tablet 0  . simvastatin (ZOCOR) 20 MG tablet Take 1 tablet by mouth daily at 2 PM.  3  . Vitamin D, Ergocalciferol, (DRISDOL) 50000 units CAPS  capsule Take 50,000 Units by mouth daily.    . vitamin E 400 UNIT capsule Take 800 Units by mouth daily.      No current facility-administered medications for this visit.      Allergies: No Known Allergies  Past Medical History, Surgical history, Social history, and Family History reviewed today and remain unchanged.  Physical Exam: Blood pressure 107/62, pulse 82, temperature (!) 96.9 F (36.1 C), temperature source Oral, resp. rate 18, weight 153 lb 12.8 oz (69.8 kg), SpO2 96 %. ECOG: 1 General appearance: Comfortable-appearing gentleman without distress today. Head: Atraumatic without abnormalities. Oropharynx: no oral ulcers or thrush. Ears: cerumen  noted in the right canal. Tympanic membrane without erythema Eyes: No scleral icterus. Pupils equal and round and reactive. Lymph nodes: Cervical, supraclavicular, and axillary nodes normal. Heart: Regular rate and rhythm without any murmurs rubs or gallops. Lung: Clear without any rhonchi, wheezes or dullness to percussion. Abdomin: Soft, nontender without rebound or guarding. No shifting dullness or ascites. Musculoskeletal: No joint deformity or effusion. Neurological: No motor or sensory deficits. Intact cerebellar function.   Lab Results: Lab Results  Component Value Date   WBC 4.3 08/03/2017   HGB 13.9 08/03/2017   HCT 39.8 08/03/2017   MCV 88.4 08/03/2017   PLT 258 08/03/2017     Chemistry      Component Value Date/Time   NA 125 (L) 06/01/2017 1406   K 4.6 06/01/2017 1406   CO2 23 06/01/2017 1406   BUN 20.6 06/01/2017 1406   CREATININE 0.7 06/01/2017 1406      Component Value Date/Time   CALCIUM 9.1 06/01/2017 1406   ALKPHOS 94 06/01/2017 1406   AST 29 06/01/2017 1406   ALT 20 06/01/2017 1406   BILITOT 0.40 06/01/2017 1406       Impression and Plan:  77 year old gentleman with the following issues:  1. Castration resistant metastatic prostate cancer with disease to the bone. He was initially  diagnosed in 2009 without Gleason score 6, stage TIc and a PSA of around 9.   He has progressed on multiple therapies outlined above and has developed bone disease that is symptomatic.  He is currently receiving Xofigo and has tolerated the first 4 months of therapy. I recommended continuing full 6 months of therapy before considering any additional intervention. Additional treatment would include systemic chemotherapy as his best option if he is symptomatic at the time. He is open to the idea of systemic chemotherapy at this time and we will restage him upon completing Xofigo treatment.  2. Bone directed therapy: He has increased risk of skeletal-related events. He will receive Xgeva every 2 months for better tolerance.  3. Androgen depravation: Risks and benefits of continuing this therapy was discussed today. He is agreeable to receive that and will be due in 2 months.  4.  Anorexia: He has lost close to 6 pounds since the last visit. We have discussed strategies to improve his intake including nutritional supplements as well as soft diet.  5. Unsteadiness and balance issues: Unclear etiology  at this time. He had that year pain in the past I recommended ENT evaluation at this time.  6. Follow-up: In 8 weeks to follow his progress.  25  minutes was spent with the patient face-to-face today.  More than 50% of time was dedicated to patient counseling, education, answering questions regarding diagnosis and prognosis and coordination of his care.     Zola Button, MD 3/1/20191:24 PM

## 2017-08-03 NOTE — Telephone Encounter (Signed)
Scheduled appt per 3/1 los - Gave patient AVS and calender per los.  

## 2017-08-03 NOTE — Patient Instructions (Signed)
Denosumab injection  What is this medicine?  DENOSUMAB (den oh sue mab) slows bone breakdown. Prolia is used to treat osteoporosis in women after menopause and in men. Xgeva is used to prevent bone fractures and other bone problems caused by cancer bone metastases. Xgeva is also used to treat giant cell tumor of the bone.  This medicine may be used for other purposes; ask your health care provider or pharmacist if you have questions.  What should I tell my health care provider before I take this medicine?  They need to know if you have any of these conditions:  -dental disease  -eczema  -infection or history of infections  -kidney disease or on dialysis  -low blood calcium or vitamin D  -malabsorption syndrome  -scheduled to have surgery or tooth extraction  -taking medicine that contains denosumab  -thyroid or parathyroid disease  -an unusual reaction to denosumab, other medicines, foods, dyes, or preservatives  -pregnant or trying to get pregnant  -breast-feeding  How should I use this medicine?  This medicine is for injection under the skin. It is given by a health care professional in a hospital or clinic setting.  If you are getting Prolia, a special MedGuide will be given to you by the pharmacist with each prescription and refill. Be sure to read this information carefully each time.  For Prolia, talk to your pediatrician regarding the use of this medicine in children. Special care may be needed. For Xgeva, talk to your pediatrician regarding the use of this medicine in children. While this drug may be prescribed for children as young as 13 years for selected conditions, precautions do apply.  Overdosage: If you think you have taken too much of this medicine contact a poison control center or emergency room at once.  NOTE: This medicine is only for you. Do not share this medicine with others.  What if I miss a dose?  It is important not to miss your dose. Call your doctor or health care professional if you are  unable to keep an appointment.  What may interact with this medicine?  Do not take this medicine with any of the following medications:  -other medicines containing denosumab  This medicine may also interact with the following medications:  -medicines that suppress the immune system  -medicines that treat cancer  -steroid medicines like prednisone or cortisone  This list may not describe all possible interactions. Give your health care provider a list of all the medicines, herbs, non-prescription drugs, or dietary supplements you use. Also tell them if you smoke, drink alcohol, or use illegal drugs. Some items may interact with your medicine.  What should I watch for while using this medicine?  Visit your doctor or health care professional for regular checks on your progress. Your doctor or health care professional may order blood tests and other tests to see how you are doing.  Call your doctor or health care professional if you get a cold or other infection while receiving this medicine. Do not treat yourself. This medicine may decrease your body's ability to fight infection.  You should make sure you get enough calcium and vitamin D while you are taking this medicine, unless your doctor tells you not to. Discuss the foods you eat and the vitamins you take with your health care professional.  See your dentist regularly. Brush and floss your teeth as directed. Before you have any dental work done, tell your dentist you are receiving this medicine.  Do   not become pregnant while taking this medicine or for 5 months after stopping it. Women should inform their doctor if they wish to become pregnant or think they might be pregnant. There is a potential for serious side effects to an unborn child. Talk to your health care professional or pharmacist for more information.  What side effects may I notice from receiving this medicine?  Side effects that you should report to your doctor or health care professional as soon as  possible:  -allergic reactions like skin rash, itching or hives, swelling of the face, lips, or tongue  -breathing problems  -chest pain  -fast, irregular heartbeat  -feeling faint or lightheaded, falls  -fever, chills, or any other sign of infection  -muscle spasms, tightening, or twitches  -numbness or tingling  -skin blisters or bumps, or is dry, peels, or red  -slow healing or unexplained pain in the mouth or jaw  -unusual bleeding or bruising  Side effects that usually do not require medical attention (Report these to your doctor or health care professional if they continue or are bothersome.):  -muscle pain  -stomach upset, gas  This list may not describe all possible side effects. Call your doctor for medical advice about side effects. You may report side effects to FDA at 1-800-FDA-1088.  Where should I keep my medicine?  This medicine is only given in a clinic, doctor's office, or other health care setting and will not be stored at home.  NOTE: This sheet is a summary. It may not cover all possible information. If you have questions about this medicine, talk to your doctor, pharmacist, or health care provider.      2016, [employer]vier/Gold Standard. (2011-11-20 12:37:47)

## 2017-08-04 LAB — PROSTATE-SPECIFIC AG, SERUM (LABCORP): Prostate Specific Ag, Serum: 19.4 ng/mL — ABNORMAL HIGH (ref 0.0–4.0)

## 2017-08-06 ENCOUNTER — Other Ambulatory Visit: Payer: Self-pay | Admitting: Radiation Oncology

## 2017-08-06 ENCOUNTER — Telehealth: Payer: Self-pay | Admitting: *Deleted

## 2017-08-06 DIAGNOSIS — C7951 Secondary malignant neoplasm of bone: Principal | ICD-10-CM

## 2017-08-06 DIAGNOSIS — C61 Malignant neoplasm of prostate: Secondary | ICD-10-CM

## 2017-08-06 NOTE — Telephone Encounter (Signed)
Called patient to inform of labs and weight on 08-23-17 and his xofigo inj. on 08-30-17, spoke with patient and he is aware of these appts.

## 2017-08-07 DIAGNOSIS — L4059 Other psoriatic arthropathy: Secondary | ICD-10-CM | POA: Diagnosis not present

## 2017-08-07 DIAGNOSIS — Z6822 Body mass index (BMI) 22.0-22.9, adult: Secondary | ICD-10-CM | POA: Diagnosis not present

## 2017-08-07 DIAGNOSIS — R21 Rash and other nonspecific skin eruption: Secondary | ICD-10-CM | POA: Diagnosis not present

## 2017-08-09 ENCOUNTER — Telehealth: Payer: Self-pay | Admitting: *Deleted

## 2017-08-09 NOTE — Telephone Encounter (Signed)
Spoke with patient regarding PSA level. Per Dr. [name], he needs to finish the Xofigo treatments. Xofigo usually does not cause the PSA to rise. After these treatments, we will draw a PSA level. He verbalized understanding.

## 2017-08-10 DIAGNOSIS — H6123 Impacted cerumen, bilateral: Secondary | ICD-10-CM | POA: Diagnosis not present

## 2017-08-10 DIAGNOSIS — H903 Sensorineural hearing loss, bilateral: Secondary | ICD-10-CM | POA: Diagnosis not present

## 2017-08-10 DIAGNOSIS — R42 Dizziness and giddiness: Secondary | ICD-10-CM | POA: Diagnosis not present

## 2017-08-10 DIAGNOSIS — H838X3 Other specified diseases of inner ear, bilateral: Secondary | ICD-10-CM | POA: Diagnosis not present

## 2017-08-22 ENCOUNTER — Telehealth: Payer: Self-pay | Admitting: *Deleted

## 2017-08-22 ENCOUNTER — Other Ambulatory Visit: Payer: Self-pay | Admitting: Radiation Oncology

## 2017-08-22 DIAGNOSIS — C7951 Secondary malignant neoplasm of bone: Secondary | ICD-10-CM

## 2017-08-22 DIAGNOSIS — C7952 Secondary malignant neoplasm of bone marrow: Principal | ICD-10-CM

## 2017-08-22 NOTE — Telephone Encounter (Signed)
CALLED PATIENT TO INFORM OF LABS AND WEIGHT FOR 08-23-17 @ 12:15 PM @ [facility], SPOKE WITH PATIENT AND HE IS AWARE OF THIS APPT.

## 2017-08-23 ENCOUNTER — Ambulatory Visit
Admission: RE | Admit: 2017-08-23 | Discharge: 2017-08-23 | Disposition: A | Discharge status: Home / Self Care | Payer: Medicare Other | Source: Ambulatory Visit | Attending: Radiation Oncology | Admitting: Radiation Oncology

## 2017-08-23 DIAGNOSIS — C7951 Secondary malignant neoplasm of bone: Secondary | ICD-10-CM | POA: Diagnosis not present

## 2017-08-23 DIAGNOSIS — C7952 Secondary malignant neoplasm of bone marrow: Secondary | ICD-10-CM

## 2017-08-23 LAB — CBC WITH DIFFERENTIAL (CANCER CENTER ONLY)
BASOS PCT: 1 %
Basophils Absolute: 0 10*3/uL (ref 0.0–0.1)
EOS ABS: 0.2 10*3/uL (ref 0.0–0.5)
EOS PCT: 3 %
HEMATOCRIT: 37.5 % — AB (ref 38.4–49.9)
Hemoglobin: 12.9 g/dL — ABNORMAL LOW (ref 13.0–17.1)
Lymphocytes Relative: 14 %
Lymphs Abs: 0.9 10*3/uL (ref 0.9–3.3)
MCH: 30.1 pg (ref 27.2–33.4)
MCHC: 34.4 g/dL (ref 32.0–36.0)
MCV: 87.6 fL (ref 79.3–98.0)
MONO ABS: 0.7 10*3/uL (ref 0.1–0.9)
MONOS PCT: 12 %
Neutro Abs: 4.2 10*3/uL (ref 1.5–6.5)
Neutrophils Relative %: 70 %
PLATELETS: 198 10*3/uL (ref 140–400)
RBC: 4.28 MIL/uL (ref 4.20–5.82)
RDW: 13.7 % (ref 11.0–14.6)
WBC Count: 6 10*3/uL (ref 4.0–10.3)

## 2017-08-30 ENCOUNTER — Telehealth: Payer: Self-pay | Admitting: *Deleted

## 2017-08-30 ENCOUNTER — Ambulatory Visit (HOSPITAL_COMMUNITY)
Admission: RE | Admit: 2017-08-30 | Discharge: 2017-08-30 | Disposition: A | Discharge status: Home / Self Care | Payer: Medicare Other | Source: Ambulatory Visit | Attending: Radiation Oncology | Admitting: Radiation Oncology

## 2017-08-30 DIAGNOSIS — C7951 Secondary malignant neoplasm of bone: Secondary | ICD-10-CM | POA: Insufficient documentation

## 2017-08-30 DIAGNOSIS — C61 Malignant neoplasm of prostate: Secondary | ICD-10-CM | POA: Diagnosis not present

## 2017-08-30 MED ORDER — RADIUM RA 223 DICHLORIDE 30 MCCI/ML IV SOLN
100.1700 | Freq: Once | INTRAVENOUS | Status: AC
  Administered 2017-08-30: 100.17 via INTRAVENOUS

## 2017-08-30 NOTE — Progress Notes (Signed)
  Radiation Oncology         (336) [phone] ________________________________  Name: Lucas Holloway MRN: [id number]  Date: 08/30/2017  DOB: Aug 14, 1940  Radium-223 Infusion Note  Diagnosis:  Castration resistant prostate cancer with painful bone involvement  Current Infusion:   5  Planned Infusions:  6  Narrative: [name] presented to nuclear medicine for treatment. His most recent blood counts were reviewed.  He remains a good candidate to proceed with Ra-223.  The patient was situated in an infusion suite with a contact barrier placed under his arm. Intravenous access was established, using sterile technique, and a normal saline infusion from a syringe was started.  Micro-dosimetry:  The prescribed radiation activity was assayed and confirmed to be within specified tolerance.  Special Treatment Procedure - Infusion:  The nuclear medicine technologist and I personally verified the dose activity to be delivered as specified in the written directive, and verified the patient identification via 2 separate methods.  The syringe containing the dose was attached to a 3 way stopcock, and then the valve was opened to the patient, and the dose delivered over a minute. No complications were noted.  The total administered dose was 104.4 microcuries in a volume of 5 cc.   A saline flush of the line and the syringe that contained the isotope was then performed.  The residual radioactivity in the syringe was 4.23 microcuries, so the actual infused isotope activity was 100.17 microcuries.   Pressure was applied to the venipuncture site, and a compression bandage placed.   Radiation Safety personnel were present to perform the discharge survey, as detailed on their documentation.   After a short period of observation, the patient had his IV removed.  Impression:  The patient tolerated his infusion relatively well.  Plan:  The patient will return in one month for ongoing care.      ________________________________  Sheral Apley. Tammi Klippel, M.D.    This document serves as a record of services personally performed by Tyler Pita, MD. It was created on his behalf by Bethann Humble, a trained medical scribe. The creation of this record is based on the scribe's personal observations and the provider's statements to them. This document has been checked and approved by the attending provider.

## 2017-08-30 NOTE — Telephone Encounter (Signed)
Called patient to remind of Xofigo Inj. for 08-30-17 @ 12 pm, spoke with patient and he is aware of this inj.

## 2017-08-31 ENCOUNTER — Telehealth: Payer: Self-pay | Admitting: *Deleted

## 2017-08-31 ENCOUNTER — Other Ambulatory Visit: Payer: Self-pay | Admitting: Radiation Oncology

## 2017-08-31 DIAGNOSIS — C61 Malignant neoplasm of prostate: Secondary | ICD-10-CM

## 2017-08-31 DIAGNOSIS — C7951 Secondary malignant neoplasm of bone: Principal | ICD-10-CM

## 2017-08-31 NOTE — Telephone Encounter (Signed)
CALLED PATIENT TO INFORM OF LAB AND WEIGHT ON 09-20-17 @ 12 PM @ [facility]. ON 09-27-17- @ 12:30 PM @ [facility], SPOKE WITH PATIENT AND HE IS AWARE OF THESE APPTS.

## 2017-09-05 DIAGNOSIS — E871 Hypo-osmolality and hyponatremia: Secondary | ICD-10-CM | POA: Diagnosis not present

## 2017-09-05 DIAGNOSIS — E782 Mixed hyperlipidemia: Secondary | ICD-10-CM | POA: Diagnosis not present

## 2017-09-05 DIAGNOSIS — Z79899 Other long term (current) drug therapy: Secondary | ICD-10-CM | POA: Diagnosis not present

## 2017-09-05 DIAGNOSIS — E039 Hypothyroidism, unspecified: Secondary | ICD-10-CM | POA: Diagnosis not present

## 2017-09-07 DIAGNOSIS — Z6822 Body mass index (BMI) 22.0-22.9, adult: Secondary | ICD-10-CM | POA: Diagnosis not present

## 2017-09-07 DIAGNOSIS — E039 Hypothyroidism, unspecified: Secondary | ICD-10-CM | POA: Diagnosis not present

## 2017-09-07 DIAGNOSIS — C799 Secondary malignant neoplasm of unspecified site: Secondary | ICD-10-CM | POA: Diagnosis not present

## 2017-09-07 DIAGNOSIS — E871 Hypo-osmolality and hyponatremia: Secondary | ICD-10-CM | POA: Diagnosis not present

## 2017-09-07 DIAGNOSIS — H9193 Unspecified hearing loss, bilateral: Secondary | ICD-10-CM | POA: Diagnosis not present

## 2017-09-07 DIAGNOSIS — L4050 Arthropathic psoriasis, unspecified: Secondary | ICD-10-CM | POA: Diagnosis not present

## 2017-09-07 DIAGNOSIS — C61 Malignant neoplasm of prostate: Secondary | ICD-10-CM | POA: Diagnosis not present

## 2017-09-19 ENCOUNTER — Other Ambulatory Visit: Payer: Self-pay | Admitting: Radiation Oncology

## 2017-09-19 ENCOUNTER — Telehealth: Payer: Self-pay | Admitting: *Deleted

## 2017-09-19 DIAGNOSIS — C7952 Secondary malignant neoplasm of bone marrow: Principal | ICD-10-CM

## 2017-09-19 DIAGNOSIS — C7951 Secondary malignant neoplasm of bone: Secondary | ICD-10-CM

## 2017-09-19 NOTE — Telephone Encounter (Signed)
CALLED PATIENT TO REMIND OF LABS AND WEIGHT FOR 09-20-17 @ 12 PM, SPOKE WITH PATIENT AND HE IS AWARE OF THIS APPT.

## 2017-09-20 ENCOUNTER — Ambulatory Visit
Admission: RE | Admit: 2017-09-20 | Discharge: 2017-09-20 | Disposition: A | Discharge status: Home / Self Care | Payer: Medicare Other | Source: Ambulatory Visit | Attending: Radiation Oncology | Admitting: Radiation Oncology

## 2017-09-20 DIAGNOSIS — C7951 Secondary malignant neoplasm of bone: Secondary | ICD-10-CM | POA: Diagnosis not present

## 2017-09-20 DIAGNOSIS — C7952 Secondary malignant neoplasm of bone marrow: Secondary | ICD-10-CM

## 2017-09-20 LAB — CBC WITH DIFFERENTIAL (CANCER CENTER ONLY)
Basophils Absolute: 0.1 10*3/uL (ref 0.0–0.1)
Basophils Relative: 1 %
EOS PCT: 2 %
Eosinophils Absolute: 0.1 10*3/uL (ref 0.0–0.5)
HEMATOCRIT: 37.9 % — AB (ref 38.4–49.9)
Hemoglobin: 12.9 g/dL — ABNORMAL LOW (ref 13.0–17.1)
LYMPHS ABS: 0.6 10*3/uL — AB (ref 0.9–3.3)
Lymphocytes Relative: 12 %
MCH: 30.3 pg (ref 27.2–33.4)
MCHC: 33.9 g/dL (ref 32.0–36.0)
MCV: 89.2 fL (ref 79.3–98.0)
MONO ABS: 0.6 10*3/uL (ref 0.1–0.9)
MONOS PCT: 12 %
NEUTROS ABS: 3.4 10*3/uL (ref 1.5–6.5)
Neutrophils Relative %: 73 %
PLATELETS: 281 10*3/uL (ref 140–400)
RBC: 4.25 MIL/uL (ref 4.20–5.82)
RDW: 14.8 % — AB (ref 11.0–14.6)
WBC Count: 4.7 10*3/uL (ref 4.0–10.3)

## 2017-09-23 NOTE — Addendum Note (Signed)
Encounter addended by: Tyler Pita, MD on: 09/23/2017 4:05 PM  Actions taken: Sign clinical note

## 2017-09-24 DIAGNOSIS — C61 Malignant neoplasm of prostate: Secondary | ICD-10-CM | POA: Diagnosis not present

## 2017-09-24 DIAGNOSIS — R5383 Other fatigue: Secondary | ICD-10-CM | POA: Diagnosis not present

## 2017-09-24 DIAGNOSIS — Z79899 Other long term (current) drug therapy: Secondary | ICD-10-CM | POA: Diagnosis not present

## 2017-09-24 DIAGNOSIS — E871 Hypo-osmolality and hyponatremia: Secondary | ICD-10-CM | POA: Diagnosis not present

## 2017-09-26 ENCOUNTER — Telehealth: Payer: Self-pay | Admitting: *Deleted

## 2017-09-26 DIAGNOSIS — R634 Abnormal weight loss: Secondary | ICD-10-CM | POA: Diagnosis not present

## 2017-09-26 DIAGNOSIS — C61 Malignant neoplasm of prostate: Secondary | ICD-10-CM | POA: Diagnosis not present

## 2017-09-26 DIAGNOSIS — R5383 Other fatigue: Secondary | ICD-10-CM | POA: Diagnosis not present

## 2017-09-26 DIAGNOSIS — Z6822 Body mass index (BMI) 22.0-22.9, adult: Secondary | ICD-10-CM | POA: Diagnosis not present

## 2017-09-26 DIAGNOSIS — E871 Hypo-osmolality and hyponatremia: Secondary | ICD-10-CM | POA: Diagnosis not present

## 2017-09-26 NOTE — Telephone Encounter (Signed)
CALLED PATIENT TO REMIND OF XOFIGO INJ. FOR 09-27-17 - ARRIVAL TIME- 12:15 PM @ [facility], SPOKE WITH PATIENT AND HE IS AWARE OF THIS INJ.

## 2017-09-27 ENCOUNTER — Encounter: Payer: Self-pay | Admitting: Radiation Oncology

## 2017-09-27 ENCOUNTER — Encounter (HOSPITAL_COMMUNITY)
Admission: RE | Admit: 2017-09-27 | Discharge: 2017-09-27 | Disposition: A | Discharge status: Home / Self Care | Payer: Medicare Other | Source: Ambulatory Visit | Attending: Radiation Oncology | Admitting: Radiation Oncology

## 2017-09-27 DIAGNOSIS — C7951 Secondary malignant neoplasm of bone: Secondary | ICD-10-CM | POA: Insufficient documentation

## 2017-09-27 DIAGNOSIS — C61 Malignant neoplasm of prostate: Secondary | ICD-10-CM | POA: Insufficient documentation

## 2017-09-27 MED ORDER — RADIUM RA 223 DICHLORIDE 30 MCCI/ML IV SOLN: 95.2500 | Freq: Once | INTRAVENOUS | Status: DC

## 2017-09-27 NOTE — Progress Notes (Signed)
  Radiation Oncology         [phone] ________________________________  Name: Lucas Holloway MRN: [id number]  Date: 09/27/2017  DOB: [birth year]  Radium-223 Infusion Note  Diagnosis:  Castration resistant prostate cancer with painful bone involvement  Current Infusion:    6  Planned Infusions:  6  Narrative: Lucas Holloway presented to nuclear medicine for treatment. His most recent blood counts were reviewed.  He remains a good candidate to proceed with Ra-223.  The patient was situated in an infusion suite with a contact barrier placed under his arm. Intravenous access was established, using sterile technique, and a normal saline infusion from a syringe was started.  Micro-dosimetry:  The prescribed radiation activity was assayed and confirmed to be within specified tolerance.  Special Treatment Procedure - Infusion:  The nuclear medicine technologist and I personally verified the dose activity to be delivered as specified in the written directive, and verified the patient identification via 2 separate methods.  The syringe containing the dose was attached to a 3 way stopcock, and then the valve was opened to the patient, and the dose delivered over a minute. No complications were noted.  The total administered dose was 99.5 microcuries in a volume of 5 cc.   A saline flush of the line and the syringe that contained the isotope was then performed.  The residual radioactivity in the syringe was 4.25 microcuries, so the actual infused isotope activity was 95.25 microcuries.   Pressure was applied to the venipuncture site, and a compression bandage placed.   Radiation Safety personnel were present to perform the discharge survey, as detailed on their documentation.   After a short period of observation, the patient had his IV removed.  Impression:  The patient tolerated his infusion relatively well.  Plan:  The patient will return in one month for ongoing care.     ________________________________  Sheral Apley. Tammi Klippel, M.D.  This document serves as a record of services personally performed by Tyler Pita, MD. It was created on his behalf by Rae Lips, a trained medical scribe. The creation of this record is based on the scribe's personal observations and the provider's statements to them. This document has been checked and approved by the attending provider.

## 2017-10-05 ENCOUNTER — Inpatient Hospital Stay: Payer: Medicare Other

## 2017-10-05 ENCOUNTER — Inpatient Hospital Stay (HOSPITAL_BASED_OUTPATIENT_CLINIC_OR_DEPARTMENT_OTHER): Payer: Medicare Other | Admitting: Oncology

## 2017-10-05 ENCOUNTER — Telehealth: Payer: Self-pay | Admitting: Oncology

## 2017-10-05 ENCOUNTER — Inpatient Hospital Stay: Payer: Medicare Other | Attending: Oncology

## 2017-10-05 VITALS — BP 122/70 | HR 98 | Temp 97.5°F | Resp 18 | Ht 68.0 in | Wt 146.1 lb

## 2017-10-05 DIAGNOSIS — Z192 Hormone resistant malignancy status: Secondary | ICD-10-CM

## 2017-10-05 DIAGNOSIS — R630 Anorexia: Secondary | ICD-10-CM | POA: Insufficient documentation

## 2017-10-05 DIAGNOSIS — Z7951 Long term (current) use of inhaled steroids: Secondary | ICD-10-CM | POA: Diagnosis not present

## 2017-10-05 DIAGNOSIS — C61 Malignant neoplasm of prostate: Secondary | ICD-10-CM

## 2017-10-05 DIAGNOSIS — Z923 Personal history of irradiation: Secondary | ICD-10-CM | POA: Insufficient documentation

## 2017-10-05 DIAGNOSIS — C7951 Secondary malignant neoplasm of bone: Secondary | ICD-10-CM | POA: Diagnosis not present

## 2017-10-05 DIAGNOSIS — Z79899 Other long term (current) drug therapy: Secondary | ICD-10-CM | POA: Diagnosis not present

## 2017-10-05 LAB — CBC WITH DIFFERENTIAL (CANCER CENTER ONLY)
Basophils Absolute: 0 10*3/uL (ref 0.0–0.1)
Basophils Relative: 1 %
Eosinophils Absolute: 0.1 10*3/uL (ref 0.0–0.5)
Eosinophils Relative: 2 %
HCT: 36.8 % — ABNORMAL LOW (ref 38.4–49.9)
HEMOGLOBIN: 12.6 g/dL — AB (ref 13.0–17.1)
LYMPHS ABS: 0.6 10*3/uL — AB (ref 0.9–3.3)
LYMPHS PCT: 10 %
MCH: 30.7 pg (ref 27.2–33.4)
MCHC: 34.2 g/dL (ref 32.0–36.0)
MCV: 89.8 fL (ref 79.3–98.0)
MONO ABS: 0.7 10*3/uL (ref 0.1–0.9)
MONOS PCT: 11 %
NEUTROS ABS: 4.6 10*3/uL (ref 1.5–6.5)
NEUTROS PCT: 76 %
Platelet Count: 217 10*3/uL (ref 140–400)
RBC: 4.1 MIL/uL — ABNORMAL LOW (ref 4.20–5.82)
RDW: 14.8 % — ABNORMAL HIGH (ref 11.0–14.6)
WBC Count: 5.9 10*3/uL (ref 4.0–10.3)

## 2017-10-05 LAB — CMP (CANCER CENTER ONLY)
ALBUMIN: 3.3 g/dL — AB (ref 3.5–5.0)
ALK PHOS: 205 U/L — AB (ref 40–150)
ALT: 19 U/L (ref 0–55)
ANION GAP: 5 (ref 3–11)
AST: 29 U/L (ref 5–34)
BILIRUBIN TOTAL: 0.5 mg/dL (ref 0.2–1.2)
BUN: 15 mg/dL (ref 7–26)
CALCIUM: 9 mg/dL (ref 8.4–10.4)
CO2: 26 mmol/L (ref 22–29)
Chloride: 96 mmol/L — ABNORMAL LOW (ref 98–109)
Creatinine: 0.66 mg/dL — ABNORMAL LOW (ref 0.70–1.30)
GFR, Est AFR Am: >60 mL/min (ref >60)
GLUCOSE: 93 mg/dL (ref 70–140)
Potassium: 4.4 mmol/L (ref 3.5–5.1)
Sodium: 127 mmol/L — ABNORMAL LOW (ref 136–145)
TOTAL PROTEIN: 6.8 g/dL (ref 6.4–8.3)

## 2017-10-05 NOTE — Telephone Encounter (Signed)
Scheduled appt per 5/3 los - pt aware of appts - no print out wanted per patient request.

## 2017-10-05 NOTE — Progress Notes (Signed)
Hematology and Oncology Follow Up Visit  Lucas Holloway [id number] 04-20-1941 77 y.o. 10/05/2017 11:25 AM [name], MDDewey, [name], MD   Principle Diagnosis: 77 year old man with castration-resistant prostate cancer with disease to the bone.  He was initially diagnosed in 2009. He had a Gleason score 6 and a PSA of 9.   Prior Therapy: He was treated with brachytherapy with excellent PSA response initially. His PSA nadir was 0.7 and 2013.   He developed metastases in October 2016 with bone metastasis. He was started on androgen deprivation under the care of Dr. [name] and his PSA did drop down initially to 0.61 in May 2017. He developed castration resistant disease in 2017. Zytiga 1000 mg daily with prednisone 5 mg daily started on 01/07/2016. Therapy discontinued and 2018 because of progression of disease. Xtandi 160 mg daily started in August 2018. The dose was reduced to 80 mg daily starting on 02/12/2017 for better tolerance.  Therapy discontinued in October 2018 because of poor tolerance and progression of disease.  Current therapy:  Xofigo infusion on a monthly basis started on May 04, 2017.  He completed 6 treatments in April 2019. He is receiving Xgeva every 8 weeks. He is currently on Lupron at 30 mg every 4 months.  Interim History: Lucas Holloway resents today for a follow-up visit.  Since the last visit, he reports no major changes in his health.  He continues to struggle with getting nutrition because of dental issues and his teeth has been pulled.  He cannot have any further dental surgery because of Xgeva.  He denies any bone pain or pathological fractures.  He denies any complications related to [facility].  He continues to enjoy a reasonable quality of life.  Continues to lose weight however.  He does not report any headaches, blurry vision, syncope. He does not report any fevers or chills or sweats. He does not report any cough, wheezing or hemoptysis. He does not report any  chest pain, palpitation orthopnea. He does not report any nausea, vomiting or abdominal pain. He does not report any frequency urgency or hesitancy. He does not report any arthralgias myalgias. He does not report any lymphadenopathy or petechiae. He does not report any skin rashes or lesions. Remaining review of systems is negative.  Medications: I have reviewed the patient's current medications.  Current Outpatient Medications  Medication Sig Dispense Refill  . calcium-vitamin D (OSCAL WITH D) 500-200 MG-UNIT per tablet Take 1 tablet by mouth 2 (two) times daily.    . Degarelix Acetate (FIRMAGON [place]) One injection a month    . Denosumab (XGEVA [place]) One injection a month    . enzalutamide (XTANDI) 40 MG capsule Take 4 capsules (160 mg total) by mouth daily. (Patient not taking: Reported on 04/02/2017) 120 capsule 0  . fish oil-omega-3 fatty acids 1000 MG capsule Take 1 g by mouth daily.     . folic acid (FOLVITE) 1 MG tablet Take 1 mg by mouth daily.    Marland Kitchen levothyroxine (SYNTHROID, LEVOTHROID) 75 MCG tablet Take 75 mcg by mouth daily before breakfast.   3  . meloxicam (MOBIC) 7.5 MG tablet Take 7.5 mg by mouth daily.    . methotrexate (RHEUMATREX) 2.5 MG tablet Take 15 mg by mouth once a week. Takes on sundays.Marland KitchenMarland KitchenCaution:Chemotherapy. Protect from light.    . Multiple Vitamin (MULTIVITAMIN WITH MINERALS) TABS Take 1 tablet by mouth daily.    . prochlorperazine (COMPAZINE) 10 MG tablet TAKE 1 TABLET(10 MG) BY MOUTH EVERY  6 HOURS AS NEEDED FOR NAUSEA OR VOMITING (Patient not taking: Reported on 04/02/2017) 385 tablet 0  . simvastatin (ZOCOR) 20 MG tablet Take 1 tablet by mouth daily at 2 PM.  3  . Vitamin D, Ergocalciferol, (DRISDOL) 50000 units CAPS capsule Take 50,000 Units by mouth daily.    . vitamin E 400 UNIT capsule Take 800 Units by mouth daily.      No current facility-administered medications for this visit.      Allergies: No Known Allergies  Past Medical History, Surgical history,  Social history, and Family History reviewed today and remain unchanged.  Physical Exam: Blood pressure 122/70, pulse 98, temperature (!) 97.5 F (36.4 C), temperature source Oral, resp. rate 18, height 5\' 8"  (1.727 m), weight 146 lb 1.6 oz (66.3 kg), SpO2 99 %.   ECOG: 1 General appearance: Alert, awake gentleman without distress. Head: Cephalic without abnormalities. Oropharynx: Membranes are moist and pink. Eyes: Pupils are equal and round reactive to light. Lymph nodes: No lymphadenopathy noted in the cervical, supraclavicular, and axillary nodes  Heart: Regular rate without any murmurs or gallops. Lung: Clear without any rhonchi, wheezes or dullness to percussion. Abdomin: Soft, nontender with good bowel sounds.  No rebound or guarding. Musculoskeletal: No clubbing or cyanosis. Neurological: No deficits noted any motor or sensory examination.   Lab Results: Lab Results  Component Value Date   WBC 4.7 09/20/2017   HGB 12.9 (L) 09/20/2017   HCT 37.9 (L) 09/20/2017   MCV 89.2 09/20/2017   PLT 281 09/20/2017     Chemistry      Component Value Date/Time   NA 125 (L) 08/03/2017 1235   NA 125 (L) 06/01/2017 1406   K 4.6 08/03/2017 1235   K 4.6 06/01/2017 1406   CL 93 (L) 08/03/2017 1235   CO2 22 08/03/2017 1235   CO2 23 06/01/2017 1406   BUN 21 08/03/2017 1235   BUN 20.6 06/01/2017 1406   CREATININE 0.70 08/03/2017 1235   CREATININE 0.7 06/01/2017 1406      Component Value Date/Time   CALCIUM 9.4 08/03/2017 1235   CALCIUM 9.1 06/01/2017 1406   ALKPHOS 129 08/03/2017 1235   ALKPHOS 94 06/01/2017 1406   AST 24 08/03/2017 1235   AST 29 06/01/2017 1406   ALT 19 08/03/2017 1235   ALT 20 06/01/2017 1406   BILITOT 0.4 08/03/2017 1235   BILITOT 0.40 06/01/2017 1406       Impression and Plan:  77 year old man with:   1. Castration-resistant prostate cancer with disease to the bone.  He is status post therapies outlined above including completing 6 infusions of  Xofigo in April 2019.  His a PSA is currently pending from today and will continue to monitor periodically.  He will need additional treatments most likely in the form of systemic chemotherapy down the line.  His nutritional status has to improve before attempting any other additional treatment.  The natural course of his disease was reviewed today again as well as different treatment options.  All his questions were answered today to his satisfaction.   2. Bone directed therapy: He remains on Xgeva every 2 months without any new complications.  Long-term complication associated with this medication including osteonecrosis of the jaw and hypocalcemia was reviewed today.  We have decided to hold Xofigo indefinitely for the time being given his potential dental surgery.  His last Xofigo injection was over 2 months ago at this time.  3. Androgen depravation: The role of androgen deprivation  therapy was discussed today in him extensively.  Alternative options for androgen deprivation including orchiectomy.  He reported increased hip pain after his recent injection and would like to hold off on any additional treatment.  He understands it is critical to treat prostate cancer with androgen deprivation at this time.  After discussion today, I will recheck his PSA and testosterone level and reintroduce Lupron in the future if his testosterone starts to rise.  4.  Anorexia: His appetite is improved but as his nutritional status has declined because of dental issues.  5.  Dental concerns: Delton See has been on hold and will not be restarted until his dental work is completed.  5. Unsteadiness and balance issues: Improved since the last visit.  7. Follow-up: In 6 weeks to follow his progress.  25  minutes was spent with the patient face-to-face today.  More than 50% of time was dedicated to patient counseling, education, answering questions regarding the natural course of his disease, treatment options and  strategies to treat his prostate cancer moving forward.     Zola Button, MD 5/3/201911:25 AM

## 2017-10-06 LAB — TESTOSTERONE: TESTOSTERONE: 4 ng/dL — AB (ref 264–916)

## 2017-10-06 LAB — PROSTATE-SPECIFIC AG, SERUM (LABCORP): PROSTATE SPECIFIC AG, SERUM: 58.1 ng/mL — AB (ref 0.0–4.0)

## 2017-10-11 ENCOUNTER — Telehealth: Payer: Self-pay | Admitting: *Deleted

## 2017-10-11 ENCOUNTER — Encounter: Payer: Self-pay | Admitting: *Deleted

## 2017-10-11 NOTE — Telephone Encounter (Signed)
Patient calling to say he noticed, in my chart that his PSA has risen and he is very concerned. States he should have his teeth soon and be able to eat. States he has lost 12 lbs. Only weighs 146 lbs now. Wants to know if anything can be done now?

## 2017-10-11 NOTE — Telephone Encounter (Signed)
Will address it with him next visit. He needs to gain weight first before anything else.

## 2017-10-11 NOTE — Telephone Encounter (Signed)
Spoke with patient, dr [name] to address treatment at next visit when patient gains weight.

## 2017-10-18 DIAGNOSIS — C61 Malignant neoplasm of prostate: Secondary | ICD-10-CM | POA: Diagnosis not present

## 2017-10-18 DIAGNOSIS — C799 Secondary malignant neoplasm of unspecified site: Secondary | ICD-10-CM | POA: Diagnosis not present

## 2017-10-18 DIAGNOSIS — R634 Abnormal weight loss: Secondary | ICD-10-CM | POA: Diagnosis not present

## 2017-10-18 DIAGNOSIS — Z6822 Body mass index (BMI) 22.0-22.9, adult: Secondary | ICD-10-CM | POA: Diagnosis not present

## 2017-10-18 DIAGNOSIS — E871 Hypo-osmolality and hyponatremia: Secondary | ICD-10-CM | POA: Diagnosis not present

## 2017-10-18 DIAGNOSIS — Z0000 Encounter for general adult medical examination without abnormal findings: Secondary | ICD-10-CM | POA: Diagnosis not present

## 2017-10-26 ENCOUNTER — Telehealth: Payer: Self-pay | Admitting: *Deleted

## 2017-10-26 NOTE — Telephone Encounter (Signed)
Faxed dr Hazeline Junker last office visit note and last bone and CT scan reports to dr Loyal Gambler, oral surgeon. He is evaluating possible implant procedures and needed more information on patient's bone mets status

## 2017-11-07 DIAGNOSIS — R21 Rash and other nonspecific skin eruption: Secondary | ICD-10-CM | POA: Diagnosis not present

## 2017-11-07 DIAGNOSIS — L4059 Other psoriatic arthropathy: Secondary | ICD-10-CM | POA: Diagnosis not present

## 2017-11-07 DIAGNOSIS — Z6822 Body mass index (BMI) 22.0-22.9, adult: Secondary | ICD-10-CM | POA: Diagnosis not present

## 2017-11-15 DIAGNOSIS — E871 Hypo-osmolality and hyponatremia: Secondary | ICD-10-CM | POA: Diagnosis not present

## 2017-11-21 DIAGNOSIS — Z6822 Body mass index (BMI) 22.0-22.9, adult: Secondary | ICD-10-CM | POA: Diagnosis not present

## 2017-11-21 DIAGNOSIS — R634 Abnormal weight loss: Secondary | ICD-10-CM | POA: Diagnosis not present

## 2017-11-21 DIAGNOSIS — E871 Hypo-osmolality and hyponatremia: Secondary | ICD-10-CM | POA: Diagnosis not present

## 2017-11-21 DIAGNOSIS — C799 Secondary malignant neoplasm of unspecified site: Secondary | ICD-10-CM | POA: Diagnosis not present

## 2017-11-21 DIAGNOSIS — C61 Malignant neoplasm of prostate: Secondary | ICD-10-CM | POA: Diagnosis not present

## 2017-11-23 ENCOUNTER — Inpatient Hospital Stay: Payer: Medicare Other | Attending: Oncology | Admitting: Oncology

## 2017-11-23 ENCOUNTER — Telehealth: Payer: Self-pay | Admitting: Oncology

## 2017-11-23 ENCOUNTER — Inpatient Hospital Stay: Payer: Medicare Other

## 2017-11-23 VITALS — BP 134/69 | HR 82 | Temp 97.8°F | Resp 17 | Ht 68.0 in | Wt 150.4 lb

## 2017-11-23 DIAGNOSIS — C7951 Secondary malignant neoplasm of bone: Secondary | ICD-10-CM | POA: Diagnosis not present

## 2017-11-23 DIAGNOSIS — R110 Nausea: Secondary | ICD-10-CM | POA: Diagnosis not present

## 2017-11-23 DIAGNOSIS — Z192 Hormone resistant malignancy status: Secondary | ICD-10-CM | POA: Diagnosis not present

## 2017-11-23 DIAGNOSIS — C61 Malignant neoplasm of prostate: Secondary | ICD-10-CM

## 2017-11-23 DIAGNOSIS — R630 Anorexia: Secondary | ICD-10-CM | POA: Insufficient documentation

## 2017-11-23 DIAGNOSIS — Z7189 Other specified counseling: Secondary | ICD-10-CM

## 2017-11-23 DIAGNOSIS — Z923 Personal history of irradiation: Secondary | ICD-10-CM | POA: Diagnosis not present

## 2017-11-23 DIAGNOSIS — Z79899 Other long term (current) drug therapy: Secondary | ICD-10-CM | POA: Insufficient documentation

## 2017-11-23 LAB — CBC WITH DIFFERENTIAL (CANCER CENTER ONLY)
Basophils Absolute: 0 10*3/uL (ref 0.0–0.1)
Basophils Relative: 0 %
EOS ABS: 0.1 10*3/uL (ref 0.0–0.5)
EOS PCT: 1 %
HCT: 33.1 % — ABNORMAL LOW (ref 38.4–49.9)
HEMOGLOBIN: 11.3 g/dL — AB (ref 13.0–17.1)
LYMPHS ABS: 0.6 10*3/uL — AB (ref 0.9–3.3)
Lymphocytes Relative: 9 %
MCH: 30.9 pg (ref 27.2–33.4)
MCHC: 34.1 g/dL (ref 32.0–36.0)
MCV: 90.4 fL (ref 79.3–98.0)
Monocytes Absolute: 0.8 10*3/uL (ref 0.1–0.9)
Monocytes Relative: 13 %
NEUTROS PCT: 77 %
Neutro Abs: 4.9 10*3/uL (ref 1.5–6.5)
Platelet Count: 228 10*3/uL (ref 140–400)
RBC: 3.66 MIL/uL — ABNORMAL LOW (ref 4.20–5.82)
RDW: 15.2 % — ABNORMAL HIGH (ref 11.0–14.6)
WBC Count: 6.4 10*3/uL (ref 4.0–10.3)

## 2017-11-23 LAB — CMP (CANCER CENTER ONLY)
ALK PHOS: 514 U/L — AB (ref 40–150)
ALT: 13 U/L (ref 0–55)
ANION GAP: 8 (ref 3–11)
AST: 34 U/L (ref 5–34)
Albumin: 3.1 g/dL — ABNORMAL LOW (ref 3.5–5.0)
BUN: 20 mg/dL (ref 7–26)
CALCIUM: 8.7 mg/dL (ref 8.4–10.4)
CO2: 24 mmol/L (ref 22–29)
Chloride: 92 mmol/L — ABNORMAL LOW (ref 98–109)
Creatinine: 0.63 mg/dL — ABNORMAL LOW (ref 0.70–1.30)
GFR, Est AFR Am: >60 mL/min (ref >60)
Glucose, Bld: 111 mg/dL (ref 70–140)
Potassium: 4.5 mmol/L (ref 3.5–5.1)
SODIUM: 124 mmol/L — AB (ref 136–145)
TOTAL PROTEIN: 6.5 g/dL (ref 6.4–8.3)
Total Bilirubin: 0.3 mg/dL (ref 0.2–1.2)

## 2017-11-23 MED ORDER — LIDOCAINE-PRILOCAINE 2.5-2.5 % EX CREA: 1.0000 "application " | TOPICAL_CREAM | CUTANEOUS | 0 refills | Status: AC | PRN

## 2017-11-23 NOTE — Progress Notes (Signed)
START ON PATHWAY REGIMEN - Prostate     A cycle is every 21 days:     Docetaxel      Prednisone   **Always confirm dose/schedule in your pharmacy ordering system**  Patient Characteristics: Adenocarcinoma, Metastatic, Castration Resistant, Symptomatic, Docetaxel Eligible Current radiographic evidence of distant metastasis<= Yes Histology: Adenocarcinoma AJCC T Category: Staged < 8th Ed. Gleason Primary: Staged < 8th Ed. AJCC N Category: Staged < 8th Ed. Gleason Secondary: Staged < 8th Ed. AJCC M Category: Staged < 8th Ed. Gleason Score: Staged < 8th Ed. AJCC 8 Stage Grouping: Staged < 8th Ed. PSA Values (ng/mL): Staged < 8th Ed.  Intent of Therapy: Non-Curative / Palliative Intent, Discussed with Patient

## 2017-11-23 NOTE — Progress Notes (Signed)
Hematology and Oncology Follow Up Visit  Lucas Holloway [id number] 1941/05/26 77 y.o. 11/23/2017 4:13 PM Lucas Holloway, MDDewey, [name], MD   Principle Diagnosis: 77 year old man with castration-resistant prostate cancer to the bone documented in 2017.  He was diagnosed in 2009 with Gleason score 6 and a PSA of 9.   Prior Therapy: He was treated with brachytherapy with excellent PSA response initially. His PSA nadir was 0.7 and 2013.   He developed metastases in October 2016 with bone metastasis. He was started on androgen deprivation under the care of Dr. [name] and his PSA did drop down initially to 0.61 in May 2017. He developed castration resistant disease in 2017. Zytiga 1000 mg daily with prednisone 5 mg daily started on 01/07/2016. Therapy discontinued and 2018 because of progression of disease. Xtandi 160 mg daily started in August 2018. The dose was reduced to 80 mg daily starting on 02/12/2017 for better tolerance.  Therapy discontinued in October 2018 because of poor tolerance and progression of disease. Xofigo infusion on a monthly basis started on May 04, 2017.  He completed 6 treatments in April 2019.  Current therapy:   Under consideration to start systemic chemotherapy.  He is receiving Xgeva every 8 weeks.  He is currently on Lupron at 30 mg every 4 months.  Interim History: [name] is here for a follow-up visit.  Since the last visit, he had dental implants placed without any complications.  He is in the process of having dentures and hopefully starting to eat better in the near future.  In the meantime he has been concentrating on soft diet and have gained 5 pounds since the last visit.  He continues to have arthralgias and myalgias that have been manageable with ibuprofen.  He continues to have issues with instability without any falls or syncope.  He denies any changes in his bowel habits or urination difficulties.  He denies any worsening back pain or hip pain.   His performance status is stable at this time.  Denies any changes in his bowel habits including constipation or diarrhea.  He denies any abdominal discomfort or early satiety.  He does not report any headaches, blurry vision, syncope.  He denies any alteration of mental status or confusion.  He does not report any fevers or chills or sweats. He does not report any cough, wheezing or hemoptysis. He does not report any chest pain, palpitation orthopnea. He does not report any nausea, vomiting or abdominal pain. He does not report any frequency urgency or hesitancy. He does not report any bone pain.  He does not report any lymphadenopathy or petechiae. He does not report any easy bruising or bleeding.. Remaining review of systems is negative.  Medications: I have reviewed the patient's current medications.  Current Outpatient Medications  Medication Sig Dispense Refill  . calcium-vitamin D (OSCAL WITH D) 500-200 MG-UNIT per tablet Take 1 tablet by mouth 2 (two) times daily.    . Degarelix Acetate (FIRMAGON [place]) One injection a month    . Denosumab (XGEVA [place]) One injection a month    . fish oil-omega-3 fatty acids 1000 MG capsule Take 1 g by mouth daily.     . folic acid (FOLVITE) 1 MG tablet Take 1 mg by mouth daily.    Marland Kitchen levothyroxine (SYNTHROID, LEVOTHROID) 75 MCG tablet Take 75 mcg by mouth daily before breakfast.   3  . lidocaine-prilocaine (EMLA) cream Apply 1 application topically as needed. 30 g 0  . meloxicam (MOBIC)  7.5 MG tablet Take 7.5 mg by mouth daily.    . methotrexate (RHEUMATREX) 2.5 MG tablet Take 15 mg by mouth once a week. Takes on sundays.Marland KitchenMarland KitchenCaution:Chemotherapy. Protect from light.    . Multiple Vitamin (MULTIVITAMIN WITH MINERALS) TABS Take 1 tablet by mouth daily.    . prochlorperazine (COMPAZINE) 10 MG tablet TAKE 1 TABLET(10 MG) BY MOUTH EVERY 6 HOURS AS NEEDED FOR NAUSEA OR VOMITING 385 tablet 0  . Vitamin D, Ergocalciferol, (DRISDOL) 50000 units CAPS capsule Take 50,000  Units by mouth daily.    . vitamin E 400 UNIT capsule Take 800 Units by mouth daily.      No current facility-administered medications for this visit.      Allergies: No Known Allergies  Past Medical History, Surgical history, Social history, and Family History reviewed today and remain unchanged.  Physical Exam: Blood pressure 134/69, pulse 82, temperature 97.8 F (36.6 C), temperature source Oral, resp. rate 17, height 5\' 8"  (1.727 m), weight 150 lb 6.4 oz (68.2 kg), SpO2 99 %.   ECOG: 1 General appearance: Comfortable appearing gentleman without distress. Head: Atraumatic without abnormalities. Oropharynx: No thrush or ulcers.  Mucous membranes are moist.  Dental implants noted on exam. Eyes: Sclera anicteric. Lymph nodes: No cervical, supraclavicular, or axillary nodes  Heart: Regular rate without any murmurs or gallops. Lung: Clear in all lung fields without any rhonchi, wheezes or dullness to percussion. Abdomin: Soft, without any rebound or guarding.  No shifting dullness or ascites. Musculoskeletal: No joint deformity or effusion. Neurological: No deficits noted on motor, sensory or deep tendon reflex examination. Psychiatric: Appropriate mood and affect.  Lab Results: Lab Results  Component Value Date   WBC 6.4 11/23/2017   HGB 11.3 (L) 11/23/2017   HCT 33.1 (L) 11/23/2017   MCV 90.4 11/23/2017   PLT 228 11/23/2017     Chemistry      Component Value Date/Time   NA 124 (L) 11/23/2017 1450   NA 125 (L) 06/01/2017 1406   K 4.5 11/23/2017 1450   K 4.6 06/01/2017 1406   CL 92 (L) 11/23/2017 1450   CO2 24 11/23/2017 1450   CO2 23 06/01/2017 1406   BUN 20 11/23/2017 1450   BUN 20.6 06/01/2017 1406   CREATININE 0.63 (L) 11/23/2017 1450   CREATININE 0.7 06/01/2017 1406      Component Value Date/Time   CALCIUM 8.7 11/23/2017 1450   CALCIUM 9.1 06/01/2017 1406   ALKPHOS 514 (H) 11/23/2017 1450   ALKPHOS 94 06/01/2017 1406   AST 34 11/23/2017 1450   AST 29  06/01/2017 1406   ALT 13 11/23/2017 1450   ALT 20 06/01/2017 1406   BILITOT 0.3 11/23/2017 1450   BILITOT 0.40 06/01/2017 1406       Impression and Plan:  77 year old man with:   1. Castration-resistant prostate cancer with disease to the bone documented in 2017.  He has progressed on multiple therapies outlined above and recently his PSA was up to 74 in May 2019.  His alkaline phosphatase has also increased dramatically up to 434.   The natural course of this disease as well as options of therapy were reviewed today and is best option moving forward will be systemic chemotherapy.  Given his rapid progression of disease and symptomatic bony metastasis, Taxotere chemotherapy offers the best option to palliate his disease.  Risks and benefits associated with this therapy was reviewed today.  Complication associated with this treatment include nausea, vomiting, myelosuppression, peripheral neuropathy as well as  infusion related complications were reviewed.  After discussion today, he is agreeable to proceed given the associated benefit has been documented including palliating pain as well as extending overall survival.  Anticipate starting in the next few weeks.  2. Bone directed therapy: Delton See will remain on hold given his dental issues at this time.  3. Androgen depravation: He is due for Lupron which will be administered in July 2019 with his next chemotherapy treatment.  4.  Anorexia: Appetite and weight has improved since the last visit.  5.  Dental concerns: Dental implants are placed without any evidence of complications or osteonecrosis of the jaw.  6.  IV access: risks and benefits of Port-A-Cath insertion was discussed today.  These complications include bleeding, thrombosis and infection.  He will have that placed before the start of chemotherapy.  7.  Antiemetics: Prescription for Compazine is available to him to use as needed.  7.  Prognosis and goals of care: He has an  incurable malignancy and the goal of therapy is to palliate his disease.  His performance status is excellent and aggressive therapy is warranted.  7. Follow-up: In 6 weeks to follow his progress.  25  minutes was spent with the patient face-to-face today.  More than 50% of time was dedicated to patient counseling, education, and coordinating future plan of care including discussion about risks and benefits of chemotherapy.     Zola Button, MD 6/21/20194:13 PM

## 2017-11-23 NOTE — Telephone Encounter (Signed)
Scheduled appt per 6/21 los - unable to schedule first treatment due to cap - logged - pt to get an updated schedule in chemo edu 6/24

## 2017-11-24 LAB — PROSTATE-SPECIFIC AG, SERUM (LABCORP): Prostate Specific Ag, Serum: 121.4 ng/mL — ABNORMAL HIGH (ref 0.0–4.0)

## 2017-11-26 ENCOUNTER — Inpatient Hospital Stay: Payer: Medicare Other

## 2017-11-26 ENCOUNTER — Other Ambulatory Visit: Payer: Self-pay | Admitting: Radiology

## 2017-11-26 ENCOUNTER — Telehealth: Payer: Self-pay | Admitting: *Deleted

## 2017-11-26 NOTE — Telephone Encounter (Signed)
-----   Message from Wyatt Portela, MD sent at 11/26/2017  9:47 AM EDT ----- Please let him know his PSA is up. He will start treatment with chemo soon.

## 2017-11-26 NOTE — Telephone Encounter (Signed)
As noted below by Dr. Shadad, I informed patient of his PSA level. He verbalized understanding.  

## 2017-11-28 ENCOUNTER — Ambulatory Visit (HOSPITAL_COMMUNITY)
Admission: RE | Admit: 2017-11-28 | Discharge: 2017-11-28 | Disposition: A | Discharge status: Home / Self Care | Payer: Medicare Other | Source: Ambulatory Visit | Attending: Oncology | Admitting: Oncology

## 2017-11-28 ENCOUNTER — Other Ambulatory Visit: Payer: Self-pay | Admitting: Oncology

## 2017-11-28 ENCOUNTER — Encounter (HOSPITAL_COMMUNITY): Payer: Self-pay

## 2017-11-28 DIAGNOSIS — Z87891 Personal history of nicotine dependence: Secondary | ICD-10-CM | POA: Diagnosis not present

## 2017-11-28 DIAGNOSIS — Z5111 Encounter for antineoplastic chemotherapy: Secondary | ICD-10-CM | POA: Diagnosis not present

## 2017-11-28 DIAGNOSIS — C7951 Secondary malignant neoplasm of bone: Secondary | ICD-10-CM

## 2017-11-28 DIAGNOSIS — C61 Malignant neoplasm of prostate: Secondary | ICD-10-CM | POA: Insufficient documentation

## 2017-11-28 DIAGNOSIS — L4050 Arthropathic psoriasis, unspecified: Secondary | ICD-10-CM | POA: Insufficient documentation

## 2017-11-28 HISTORY — PX: IR IMAGING GUIDED PORT INSERTION: IMG5740

## 2017-11-28 LAB — CBC WITH DIFFERENTIAL/PLATELET
Basophils Absolute: 0 10*3/uL (ref 0.0–0.1)
Basophils Relative: 0 %
EOS PCT: 2 %
Eosinophils Absolute: 0.1 10*3/uL (ref 0.0–0.7)
HCT: 35.6 % — ABNORMAL LOW (ref 39.0–52.0)
Hemoglobin: 12.2 g/dL — ABNORMAL LOW (ref 13.0–17.0)
LYMPHS PCT: 14 %
Lymphs Abs: 0.8 10*3/uL (ref 0.7–4.0)
MCH: 31.1 pg (ref 26.0–34.0)
MCHC: 34.3 g/dL (ref 30.0–36.0)
MCV: 90.8 fL (ref 78.0–100.0)
MONO ABS: 0.6 10*3/uL (ref 0.1–1.0)
MONOS PCT: 12 %
Neutro Abs: 4.1 10*3/uL (ref 1.7–7.7)
Neutrophils Relative %: 72 %
PLATELETS: 298 10*3/uL (ref 150–400)
RBC: 3.92 MIL/uL — ABNORMAL LOW (ref 4.22–5.81)
RDW: 15.2 % (ref 11.5–15.5)
WBC: 5.6 10*3/uL (ref 4.0–10.5)

## 2017-11-28 LAB — PROTIME-INR
INR: 1.15
Prothrombin Time: 14.6 seconds (ref 11.4–15.2)

## 2017-11-28 MED ORDER — CEFAZOLIN SODIUM-DEXTROSE 2-4 GM/100ML-% IV SOLN
2.0000 g | INTRAVENOUS | Status: AC
  Administered 2017-11-28: 2 g via INTRAVENOUS

## 2017-11-28 MED ORDER — NALOXONE HCL 0.4 MG/ML IJ SOLN
INTRAMUSCULAR | Status: AC
  Filled 2017-11-28: qty 1

## 2017-11-28 MED ORDER — MIDAZOLAM HCL 2 MG/2ML IJ SOLN
INTRAMUSCULAR | Status: AC | PRN
  Administered 2017-11-28: 0.5 mg via INTRAVENOUS
  Administered 2017-11-28: 1 mg via INTRAVENOUS

## 2017-11-28 MED ORDER — CEFAZOLIN SODIUM-DEXTROSE 2-4 GM/100ML-% IV SOLN
INTRAVENOUS | Status: AC
  Administered 2017-11-28: 2 g via INTRAVENOUS
  Filled 2017-11-28: qty 100

## 2017-11-28 MED ORDER — MIDAZOLAM HCL 2 MG/2ML IJ SOLN
INTRAMUSCULAR | Status: AC
  Filled 2017-11-28: qty 4

## 2017-11-28 MED ORDER — FENTANYL CITRATE (PF) 100 MCG/2ML IJ SOLN
INTRAMUSCULAR | Status: AC | PRN
  Administered 2017-11-28: 50 ug via INTRAVENOUS
  Administered 2017-11-28: 25 ug via INTRAVENOUS

## 2017-11-28 MED ORDER — HEPARIN SOD (PORK) LOCK FLUSH 100 UNIT/ML IV SOLN
INTRAVENOUS | Status: AC
  Filled 2017-11-28: qty 5

## 2017-11-28 MED ORDER — HEPARIN SOD (PORK) LOCK FLUSH 100 UNIT/ML IV SOLN
INTRAVENOUS | Status: AC | PRN
  Administered 2017-11-28: 500 [IU] via INTRAVENOUS

## 2017-11-28 MED ORDER — SODIUM CHLORIDE 0.9 % IV SOLN
INTRAVENOUS | Status: DC
  Administered 2017-11-28: 08:00:00 via INTRAVENOUS

## 2017-11-28 MED ORDER — FLUMAZENIL 0.5 MG/5ML IV SOLN
INTRAVENOUS | Status: AC
  Filled 2017-11-28: qty 5

## 2017-11-28 MED ORDER — LIDOCAINE HCL 1 % IJ SOLN
INTRAMUSCULAR | Status: AC
  Filled 2017-11-28: qty 20

## 2017-11-28 MED ORDER — LIDOCAINE-EPINEPHRINE (PF) 2 %-1:200000 IJ SOLN
INTRAMUSCULAR | Status: AC
  Filled 2017-11-28: qty 20

## 2017-11-28 MED ORDER — FENTANYL CITRATE (PF) 100 MCG/2ML IJ SOLN
INTRAMUSCULAR | Status: AC
  Filled 2017-11-28: qty 4

## 2017-11-28 NOTE — H&P (Signed)
Chief Complaint: Patient was seen in consultation today for prostate cancer.  Referring Physician(s): Wyatt Portela  Supervising Physician: Markus Daft  Patient Status: [facility] - Out-pt  History of Present Illness: Lucas Holloway is a 77 y.o. male with history of psoriatic arthritis, and prostate cancer diagnosed in 2009.  His cancer is now castration-resistant with metastasis to the bone as of 2016.  He now has plans to initiate systemic chemotherapy.  IR consulted for Port-A-Cath placement at the request of Dr. [name].   Patient presents today in his usual state of health.  Denies fever, chills, cough, congestion, abdominal pain, dysuria new to him.  He does not take blood thinners.  He has been NPO.   Past Medical History:  Diagnosis Date  . Polyarticular psoriatic arthritis ([place])   . Prostate cancer [facility])     Past Surgical History:  Procedure Laterality Date  . radioactive seed implant prostate  2009    Allergies: Patient has no known allergies.  Medications: Prior to Admission medications   Medication Sig Start Date End Date Taking? Authorizing Provider  calcium-vitamin D (OSCAL WITH D) 500-200 MG-UNIT per tablet Take 1 tablet by mouth 2 (two) times daily.   Yes [provider]  fish oil-omega-3 fatty acids 1000 MG capsule Take 1 g by mouth daily.    Yes [provider]  folic acid (FOLVITE) 1 MG tablet Take 1 mg by mouth daily.   Yes [provider]  levothyroxine (SYNTHROID, LEVOTHROID) 75 MCG tablet Take 75 mcg by mouth daily before breakfast.  10/01/15  Yes [provider]  meloxicam (MOBIC) 7.5 MG tablet Take 7.5 mg by mouth daily.   Yes [provider]  Multiple Vitamin (MULTIVITAMIN WITH MINERALS) TABS Take 1 tablet by mouth daily.   Yes [provider]  Vitamin D, Ergocalciferol, (DRISDOL) 50000 units CAPS capsule Take 50,000 Units by mouth daily.   Yes [provider]  vitamin E 400 UNIT capsule Take  800 Units by mouth daily.    Yes [provider]  Degarelix Acetate (FIRMAGON [place]) One injection a month    [provider]  Denosumab (XGEVA [place]) One injection a month    [provider]  lidocaine-prilocaine (EMLA) cream Apply 1 application topically as needed. 11/23/17   Wyatt Portela, MD  methotrexate (RHEUMATREX) 2.5 MG tablet Take 15 mg by mouth once a week. Takes on sundays.Marland KitchenMarland KitchenCaution:Chemotherapy. Protect from light.    [provider]  prochlorperazine (COMPAZINE) 10 MG tablet TAKE 1 TABLET(10 MG) BY MOUTH EVERY 6 HOURS AS NEEDED FOR NAUSEA OR VOMITING 02/12/17   Wyatt Portela, MD     Family History  Problem Relation Age of Onset  . Cancer Sister        Ovarian    Social History   Socioeconomic History  . Marital status: Married    Spouse name: Not on file  . Number of children: Not on file  . Years of education: Not on file  . Highest education level: Not on file  Occupational History  . Not on file  Social Needs  . Financial resource strain: Not on file  . Food insecurity:    Worry: Not on file    Inability: Not on file  . Transportation needs:    Medical: Not on file    Non-medical: Not on file  Tobacco Use  . Smoking status: Former Research scientist (life sciences)  . Smokeless tobacco: Never Used  Substance and Sexual Activity  . Alcohol use:  No    Alcohol/week: 0.0 oz  . Drug use: No  . Sexual activity: Not on file  Lifestyle  . Physical activity:    Days per week: Not on file    Minutes per session: Not on file  . Stress: Not on file  Relationships  . Social connections:    Talks on phone: Not on file    Gets together: Not on file    Attends religious service: Not on file    Active member of club or organization: Not on file    Attends meetings of clubs or organizations: Not on file    Relationship status: Not on file  Other Topics Concern  . Not on file  Social History Narrative  . Not on file     Review of Systems: A 12 point ROS  discussed and pertinent positives are indicated in the HPI above.  All other systems are negative.  Review of Systems  Constitutional: Negative for fatigue and fever.  Respiratory: Negative for cough and choking.   Cardiovascular: Negative for chest pain.  Gastrointestinal: Negative for abdominal pain.  Musculoskeletal: Negative for back pain.  Psychiatric/Behavioral: Negative for behavioral problems and confusion.    Vital Signs: There were no vitals taken for this visit.  Physical Exam  Constitutional: He is oriented to person, place, and time. He appears well-developed.  Neck: Normal range of motion. Neck supple.  Cardiovascular: Normal rate, regular rhythm and normal heart sounds.  Pulmonary/Chest: Effort normal and breath sounds normal. No respiratory distress.  Abdominal: Soft. He exhibits no distension. There is no tenderness.  Lymphadenopathy:    He has no cervical adenopathy.  Neurological: He is alert and oriented to person, place, and time.  Skin: Skin is warm and dry.  Psychiatric: He has a normal mood and affect. His behavior is normal. Judgment and thought content normal.  Nursing note and vitals reviewed.    MD Evaluation Airway: WNL Heart: WNL Abdomen: WNL Chest/ Lungs: WNL ASA  Classification: 3 Mallampati/Airway Score: One   Imaging: No results found.  Labs:  CBC: Recent Labs    09/20/17 1154 10/05/17 1111 11/23/17 1450 11/28/17 0747  WBC 4.7 5.9 6.4 5.6  HGB 12.9* 12.6* 11.3* 12.2*  HCT 37.9* 36.8* 33.1* 35.6*  PLT 281 217 228 298    COAGS: Recent Labs    11/28/17 0747  INR 1.15    BMP: Recent Labs    06/01/17 1406 08/03/17 1235 10/05/17 1111 11/23/17 1450  NA 125* 125* 127* 124*  K 4.6 4.6 4.4 4.5  CL  --  93* 96* 92*  CO2 23 22 26 24   GLUCOSE 92 101 93 111  BUN 20.6 21 15 20   CALCIUM 9.1 9.4 9.0 8.7  CREATININE 0.7 0.70 0.66* 0.63*  GFRNONAA  --  >60 >60 >60  GFRAA  --  >60 >60 >60    LIVER FUNCTION TESTS: Recent  Labs    06/01/17 1406 08/03/17 1235 10/05/17 1111 11/23/17 1450  BILITOT 0.40 0.4 0.5 0.3  AST 29 24 29  34  ALT 20 19 19 13   ALKPHOS 94 129 205* 514*  PROT 6.7 6.6 6.8 6.5  ALBUMIN 3.3* 3.1* 3.3* 3.1*    TUMOR MARKERS: No results for input(s): AFPTM, CEA, CA199, CHROMGRNA in the last 8760 hours.  Assessment and Plan: Patient with past medical history of prostate cancer presents with plans to initiate chemotherapy. IR consulted for Port-A-Cath placement at the request of Dr. [name]. Case reviewed by Dr. Anselm Pancoast who  approves patient for procedure.  Patient presents today in their usual state of health.  He has been NPO and is not currently on blood thinners.   Risks and benefits of image guided port-a-catheter placement was discussed with the patient including, but not limited to bleeding, infection, pneumothorax, or fibrin sheath development and need for additional procedures.  All of the patient's questions were answered, patient is agreeable to proceed. Consent signed and in chart.  Thank you for this interesting consult.  I greatly enjoyed meeting [employer] and look forward to participating in their care.  A copy of this report was sent to the requesting provider on this date.  Electronically Signed: Docia Barrier, PA 11/28/2017, 9:17 AM   I spent a total of  30 Minutes   in face to face in clinical consultation, greater than 50% of which was counseling/coordinating care for prostate cancer.

## 2017-11-28 NOTE — Procedures (Signed)
Placement of right jugular port.  Tip at SVC/RA junction.  Minimal blood loss and no immediate complication.  

## 2017-11-28 NOTE — Discharge Instructions (Signed)
You may remove dressing and shower in 24 hours.   Do not use EMLA cream until all steri strips and glue have come off on their own.    Implanted Port Insertion, Care After This sheet gives you information about how to care for yourself after your procedure. Your health care provider may also give you more specific instructions. If you have problems or questions, contact your health care provider. What can I expect after the procedure? After your procedure, it is common to have:  Discomfort at the port insertion site.  Bruising on the skin over the port. This should improve over 3-4 days.  Follow these instructions at home: [facility] care  After your port is placed, you will get a manufacturer's information card. The card has information about your port. Keep this card with you at all times.  Take care of the port as told by your health care provider. Ask your health care provider if you or a family member can get training for taking care of the port at home. A home health care nurse may also take care of the port.  Make sure to remember what type of port you have. Incision care  Follow instructions from your health care provider about how to take care of your port insertion site. Make sure you: ? Wash your hands with soap and water before you change your bandage (dressing). If soap and water are not available, use hand sanitizer. ? Change your dressing as told by your health care provider. ? Leave stitches (sutures), skin glue, or adhesive strips in place. These skin closures may need to stay in place for 2 weeks or longer. If adhesive strip edges start to loosen and curl up, you may trim the loose edges. Do not remove adhesive strips completely unless your health care provider tells you to do that.  Check your port insertion site every day for signs of infection. Check for: ? More redness, swelling, or pain. ? More fluid or blood. ? Warmth. ? Pus or a bad smell. General  instructions  Do not take baths, swim, or use a hot tub until your health care provider approves.  Do not lift anything that is heavier than 10 lb (4.5 kg) for a week, or as told by your health care provider.  Ask your health care provider when it is okay to: ? Return to work or school. ? Resume usual physical activities or sports.  Do not drive for 24 hours if you were given a medicine to help you relax (sedative).  Take over-the-counter and prescription medicines only as told by your health care provider.  Wear a medical alert bracelet in case of an emergency. This will tell any health care providers that you have a port.  Keep all follow-up visits as told by your health care provider. This is important. Contact a health care provider if:  You cannot flush your port with saline as directed, or you cannot draw blood from the port.  You have a fever or chills.  You have more redness, swelling, or pain around your port insertion site.  You have more fluid or blood coming from your port insertion site.  Your port insertion site feels warm to the touch.  You have pus or a bad smell coming from the port insertion site. Get help right away if:  You have chest pain or shortness of breath.  You have bleeding from your port that you cannot control. Summary  Take care of the  port as told by your health care provider.  Change your dressing as told by your health care provider.  Keep all follow-up visits as told by your health care provider. This information is not intended to replace advice given to you by your health care provider. Make sure you discuss any questions you have with your health care provider. Document Released: 03/12/2013 Document Revised: 04/12/2016 Document Reviewed: 04/12/2016 Elsevier Interactive Patient Education  2017 [place].   Moderate Conscious Sedation, Adult, Care After These instructions provide you with information about caring for yourself  after your procedure. Your health care provider may also give you more specific instructions. Your treatment has been planned according to current medical practices, but problems sometimes occur. Call your health care provider if you have any problems or questions after your procedure. What can I expect after the procedure? After your procedure, it is common:  To feel sleepy for several hours.  To feel clumsy and have poor balance for several hours.  To have poor judgment for several hours.  To vomit if you eat too soon.  Follow these instructions at home: For at least 24 hours after the procedure:   Do not: ? Participate in activities where you could fall or become injured. ? Drive. ? Use heavy machinery. ? Drink alcohol. ? Take sleeping pills or medicines that cause drowsiness. ? Make important decisions or sign legal documents. ? Take care of children on your own.  Rest. Eating and drinking  Follow the diet recommended by your health care provider.  If you vomit: ? Drink water, juice, or soup when you can drink without vomiting. ? Make sure you have little or no nausea before eating solid foods. General instructions  Have a responsible adult stay with you until you are awake and alert.  Take over-the-counter and prescription medicines only as told by your health care provider.  If you smoke, do not smoke without supervision.  Keep all follow-up visits as told by your health care provider. This is important. Contact a health care provider if:  You keep feeling nauseous or you keep vomiting.  You feel light-headed.  You develop a rash.  You have a fever. Get help right away if:  You have trouble breathing. This information is not intended to replace advice given to you by your health care provider. Make sure you discuss any questions you have with your health care provider. Document Released: 03/12/2013 Document Revised: 10/25/2015 Document Reviewed:  09/11/2015 Elsevier Interactive Patient Education  [employer].

## 2017-12-04 ENCOUNTER — Inpatient Hospital Stay: Payer: Medicare Other

## 2017-12-04 ENCOUNTER — Inpatient Hospital Stay: Payer: Medicare Other | Attending: Oncology

## 2017-12-04 ENCOUNTER — Other Ambulatory Visit: Payer: Self-pay | Admitting: Hematology and Oncology

## 2017-12-04 VITALS — BP 150/71 | HR 78 | Temp 97.7°F | Resp 18

## 2017-12-04 DIAGNOSIS — Z5111 Encounter for antineoplastic chemotherapy: Secondary | ICD-10-CM | POA: Insufficient documentation

## 2017-12-04 DIAGNOSIS — Z192 Hormone resistant malignancy status: Secondary | ICD-10-CM | POA: Insufficient documentation

## 2017-12-04 DIAGNOSIS — Z7689 Persons encountering health services in other specified circumstances: Secondary | ICD-10-CM | POA: Diagnosis not present

## 2017-12-04 DIAGNOSIS — C61 Malignant neoplasm of prostate: Secondary | ICD-10-CM | POA: Diagnosis not present

## 2017-12-04 DIAGNOSIS — C7951 Secondary malignant neoplasm of bone: Secondary | ICD-10-CM

## 2017-12-04 DIAGNOSIS — Z79818 Long term (current) use of other agents affecting estrogen receptors and estrogen levels: Secondary | ICD-10-CM | POA: Insufficient documentation

## 2017-12-04 DIAGNOSIS — R630 Anorexia: Secondary | ICD-10-CM | POA: Diagnosis not present

## 2017-12-04 DIAGNOSIS — R5383 Other fatigue: Secondary | ICD-10-CM | POA: Diagnosis not present

## 2017-12-04 DIAGNOSIS — Z79899 Other long term (current) drug therapy: Secondary | ICD-10-CM | POA: Insufficient documentation

## 2017-12-04 LAB — CBC WITH DIFFERENTIAL (CANCER CENTER ONLY)
BASOS PCT: 0 %
Basophils Absolute: 0 10*3/uL (ref 0.0–0.1)
EOS ABS: 0.1 10*3/uL (ref 0.0–0.5)
Eosinophils Relative: 1 %
HEMATOCRIT: 33.8 % — AB (ref 38.4–49.9)
HEMOGLOBIN: 11.3 g/dL — AB (ref 13.0–17.1)
Lymphocytes Relative: 12 %
Lymphs Abs: 0.7 10*3/uL — ABNORMAL LOW (ref 0.9–3.3)
MCH: 30.1 pg (ref 27.2–33.4)
MCHC: 33.4 g/dL (ref 32.0–36.0)
MCV: 90.1 fL (ref 79.3–98.0)
MONO ABS: 0.6 10*3/uL (ref 0.1–0.9)
Monocytes Relative: 10 %
NEUTROS ABS: 4.2 10*3/uL (ref 1.5–6.5)
NEUTROS PCT: 77 %
Platelet Count: 225 10*3/uL (ref 140–400)
RBC: 3.75 MIL/uL — ABNORMAL LOW (ref 4.20–5.82)
RDW: 15.3 % — AB (ref 11.0–14.6)
WBC Count: 5.5 10*3/uL (ref 4.0–10.3)

## 2017-12-04 LAB — CMP (CANCER CENTER ONLY)
ALT: 16 U/L (ref 0–44)
AST: 30 U/L (ref 15–41)
Albumin: 3 g/dL — ABNORMAL LOW (ref 3.5–5.0)
Alkaline Phosphatase: 561 U/L — ABNORMAL HIGH (ref 38–126)
Anion gap: 6 (ref 5–15)
BILIRUBIN TOTAL: 0.3 mg/dL (ref 0.3–1.2)
BUN: 15 mg/dL (ref 8–23)
CO2: 26 mmol/L (ref 22–32)
Calcium: 9.1 mg/dL (ref 8.9–10.3)
Chloride: 94 mmol/L — ABNORMAL LOW (ref 98–111)
Creatinine: 0.66 mg/dL (ref 0.61–1.24)
GFR, Est AFR Am: >60 mL/min (ref >60)
GFR, Estimated: >60 mL/min (ref >60)
GLUCOSE: 108 mg/dL — AB (ref 70–99)
POTASSIUM: 4.7 mmol/L (ref 3.5–5.1)
SODIUM: 126 mmol/L — AB (ref 135–145)
TOTAL PROTEIN: 6.5 g/dL (ref 6.5–8.1)

## 2017-12-04 MED ORDER — LEUPROLIDE ACETATE (4 MONTH) 30 MG IM KIT
30.0000 mg | PACK | Freq: Once | INTRAMUSCULAR | Status: AC
  Administered 2017-12-04: 30 mg via INTRAMUSCULAR
  Filled 2017-12-04: qty 30

## 2017-12-04 MED ORDER — SODIUM CHLORIDE 0.9 % IV SOLN
Freq: Once | INTRAVENOUS | Status: AC
  Administered 2017-12-04: 11:00:00 via INTRAVENOUS

## 2017-12-04 MED ORDER — HEPARIN SOD (PORK) LOCK FLUSH 100 UNIT/ML IV SOLN
500.0000 [IU] | Freq: Once | INTRAVENOUS | Status: AC | PRN
  Administered 2017-12-04: 500 [IU]
  Filled 2017-12-04: qty 5

## 2017-12-04 MED ORDER — SODIUM CHLORIDE 0.9% FLUSH
10.0000 mL | INTRAVENOUS | Status: DC | PRN
  Administered 2017-12-04: 10 mL
  Filled 2017-12-04: qty 10

## 2017-12-04 MED ORDER — DEXAMETHASONE SODIUM PHOSPHATE 10 MG/ML IJ SOLN
10.0000 mg | Freq: Once | INTRAMUSCULAR | Status: AC
  Administered 2017-12-04: 10 mg via INTRAVENOUS

## 2017-12-04 MED ORDER — DEXAMETHASONE SODIUM PHOSPHATE 10 MG/ML IJ SOLN
INTRAMUSCULAR | Status: AC
  Filled 2017-12-04: qty 1

## 2017-12-04 MED ORDER — SODIUM CHLORIDE 0.9 % IV SOLN
60.0000 mg/m2 | Freq: Once | INTRAVENOUS | Status: AC
  Administered 2017-12-04: 110 mg via INTRAVENOUS
  Filled 2017-12-04: qty 11

## 2017-12-04 NOTE — Patient Instructions (Addendum)
[place] Discharge Instructions for Patients Receiving Chemotherapy  Today you received the following chemotherapy agents Taxotere.  To help prevent nausea and vomiting after your treatment, we encourage you to take your nausea medication as prescribed.   If you develop nausea and vomiting that is not controlled by your nausea medication, call the clinic.   BELOW ARE SYMPTOMS THAT SHOULD BE REPORTED IMMEDIATELY:  *FEVER GREATER THAN 100.5 F  *CHILLS WITH OR WITHOUT FEVER  NAUSEA AND VOMITING THAT IS NOT CONTROLLED WITH YOUR NAUSEA MEDICATION  *UNUSUAL SHORTNESS OF BREATH  *UNUSUAL BRUISING OR BLEEDING  TENDERNESS IN MOUTH AND THROAT WITH OR WITHOUT PRESENCE OF ULCERS  *URINARY PROBLEMS  *BOWEL PROBLEMS  UNUSUAL RASH Items with * indicate a potential emergency and should be followed up as soon as possible.  Feel free to call the clinic should you have any questions or concerns. The clinic phone number is [phone].  Please show the [place] at check-in to the Emergency Department and triage nurse.    Docetaxel injection (Taxotere) What is this medicine? DOCETAXEL (doe se TAX el) is a chemotherapy drug. It targets fast dividing cells, like cancer cells, and causes these cells to die. This medicine is used to treat many types of cancers like breast cancer, certain stomach cancers, head and neck cancer, lung cancer, and prostate cancer. This medicine may be used for other purposes; ask your health care provider or pharmacist if you have questions. COMMON BRAND NAME(S): Docefrez, Taxotere What should I tell my health care provider before I take this medicine? They need to know if you have any of these conditions: -infection (especially a virus infection such as chickenpox, cold sores, or herpes) -liver disease -low blood counts, like low white cell, platelet, or red cell counts -an unusual or allergic reaction to docetaxel, polysorbate 80, other  chemotherapy agents, other medicines, foods, dyes, or preservatives -pregnant or trying to get pregnant -breast-feeding How should I use this medicine? This drug is given as an infusion into a vein. It is administered in a hospital or clinic by a specially trained health care professional. Talk to your pediatrician regarding the use of this medicine in children. Special care may be needed. Overdosage: If you think you have taken too much of this medicine contact a poison control center or emergency room at once. NOTE: This medicine is only for you. Do not share this medicine with others. What if I miss a dose? It is important not to miss your dose. Call your doctor or health care professional if you are unable to keep an appointment. What may interact with this medicine? -cyclosporine -erythromycin -ketoconazole -medicines to increase blood counts like filgrastim, pegfilgrastim, sargramostim -vaccines Talk to your doctor or health care professional before taking any of these medicines: -acetaminophen -aspirin -ibuprofen -ketoprofen -naproxen This list may not describe all possible interactions. Give your health care provider a list of all the medicines, herbs, non-prescription drugs, or dietary supplements you use. Also tell them if you smoke, drink alcohol, or use illegal drugs. Some items may interact with your medicine. What should I watch for while using this medicine? Your condition will be monitored carefully while you are receiving this medicine. You will need important blood work done while you are taking this medicine. This drug may make you feel generally unwell. This is not uncommon, as chemotherapy can affect healthy cells as well as cancer cells. Report any side effects. Continue your course of treatment even though you  feel ill unless your doctor tells you to stop. In some cases, you may be given additional medicines to help with side effects. Follow all directions for their  use. Call your doctor or health care professional for advice if you get a fever, chills or sore throat, or other symptoms of a cold or flu. Do not treat yourself. This drug decreases your body's ability to fight infections. Try to avoid being around people who are sick. This medicine may increase your risk to bruise or bleed. Call your doctor or health care professional if you notice any unusual bleeding. This medicine may contain alcohol in the product. You may get drowsy or dizzy. Do not drive, use machinery, or do anything that needs mental alertness until you know how this medicine affects you. Do not stand or sit up quickly, especially if you are an older patient. This reduces the risk of dizzy or fainting spells. Avoid alcoholic drinks. Do not become pregnant while taking this medicine. Women should inform their doctor if they wish to become pregnant or think they might be pregnant. There is a potential for serious side effects to an unborn child. Talk to your health care professional or pharmacist for more information. Do not breast-feed an infant while taking this medicine. What side effects may I notice from receiving this medicine? Side effects that you should report to your doctor or health care professional as soon as possible: -allergic reactions like skin rash, itching or hives, swelling of the face, lips, or tongue -low blood counts - This drug may decrease the number of white blood cells, red blood cells and platelets. You may be at increased risk for infections and bleeding. -signs of infection - fever or chills, cough, sore throat, pain or difficulty passing urine -signs of decreased platelets or bleeding - bruising, pinpoint red spots on the skin, black, tarry stools, nosebleeds -signs of decreased red blood cells - unusually weak or tired, fainting spells, lightheadedness -breathing problems -fast or irregular heartbeat -low blood pressure -mouth sores -nausea and vomiting -pain,  swelling, redness or irritation at the injection site -pain, tingling, numbness in the hands or feet -swelling of the ankle, feet, hands -weight gain Side effects that usually do not require medical attention (report to your doctor or health care professional if they continue or are bothersome): -bone pain -complete hair loss including hair on your head, underarms, pubic hair, eyebrows, and eyelashes -diarrhea -excessive tearing -changes in the color of fingernails -loosening of the fingernails -nausea -muscle pain -red flush to skin -sweating -weak or tired This list may not describe all possible side effects. Call your doctor for medical advice about side effects. You may report side effects to FDA at 1-800-FDA-1088. Where should I keep my medicine? This drug is given in a hospital or clinic and will not be stored at home. NOTE: This sheet is a summary. It may not cover all possible information. If you have questions about this medicine, talk to your doctor, pharmacist, or health care provider.  2018 Elsevier/Gold Standard (2015-06-24 12:32:56)   Leuprolide injection (Lupron) What is this medicine? LEUPROLIDE (loo PROE lide) is a man-made hormone. It is used to treat the symptoms of prostate cancer. This medicine may also be used to treat children with early onset of puberty. It may be used for other hormonal conditions. This medicine may be used for other purposes; ask your health care provider or pharmacist if you have questions. COMMON BRAND NAME(S): Lupron What should I tell my  health care provider before I take this medicine? They need to know if you have any of these conditions: -diabetes -heart disease or previous heart attack -high blood pressure -high cholesterol -pain or difficulty passing urine -spinal cord metastasis -stroke -tobacco smoker -an unusual or allergic reaction to leuprolide, benzyl alcohol, other medicines, foods, dyes, or preservatives -pregnant or  trying to get pregnant -breast-feeding How should I use this medicine? This medicine is for injection under the skin or into a muscle. You will be taught how to prepare and give this medicine. Use exactly as directed. Take your medicine at regular intervals. Do not take your medicine more often than directed. It is important that you put your used needles and syringes in a special sharps container. Do not put them in a trash can. If you do not have a sharps container, call your pharmacist or healthcare provider to get one. A special MedGuide will be given to you by the pharmacist with each prescription and refill. Be sure to read this information carefully each time. Talk to your pediatrician regarding the use of this medicine in children. While this medicine may be prescribed for children as young as 8 years for selected conditions, precautions do apply. Overdosage: If you think you have taken too much of this medicine contact a poison control center or emergency room at once. NOTE: This medicine is only for you. Do not share this medicine with others. What if I miss a dose? If you miss a dose, take it as soon as you can. If it is almost time for your next dose, take only that dose. Do not take double or extra doses. What may interact with this medicine? Do not take this medicine with any of the following medications: -chasteberry This medicine may also interact with the following medications: -herbal or dietary supplements, like black cohosh or DHEA -male hormones, like estrogens or progestins and birth control pills, patches, rings, or injections -male hormones, like testosterone This list may not describe all possible interactions. Give your health care provider a list of all the medicines, herbs, non-prescription drugs, or dietary supplements you use. Also tell them if you smoke, drink alcohol, or use illegal drugs. Some items may interact with your medicine. What should I watch for while  using this medicine? Visit your doctor or health care professional for regular checks on your progress. During the first week, your symptoms may get worse, but then will improve as you continue your treatment. You may get hot flashes, increased bone pain, increased difficulty passing urine, or an aggravation of nerve symptoms. Discuss these effects with your doctor or health care professional, some of them may improve with continued use of this medicine. Male patients may experience a menstrual cycle or spotting during the first 2 months of therapy with this medicine. If this continues, contact your doctor or health care professional. What side effects may I notice from receiving this medicine? Side effects that you should report to your doctor or health care professional as soon as possible: -allergic reactions like skin rash, itching or hives, swelling of the face, lips, or tongue -breathing problems -chest pain -depression or memory disorders -pain in your legs or groin -pain at site where injected -severe headache -swelling of the feet and legs -visual changes -vomiting Side effects that usually do not require medical attention (report to your doctor or health care professional if they continue or are bothersome): -breast swelling or tenderness -decrease in sex drive or performance -diarrhea -hot  flashes -loss of appetite -muscle, joint, or bone pains -nausea -redness or irritation at site where injected -skin problems or acne This list may not describe all possible side effects. Call your doctor for medical advice about side effects. You may report side effects to FDA at 1-800-FDA-1088. Where should I keep my medicine? Keep out of the reach of children. Store below 25 degrees C (77 degrees F). Do not freeze. Protect from light. Do not use if it is not clear or if there are particles present. Throw away any unused medicine after the expiration date. NOTE: This sheet is a summary.  It may not cover all possible information. If you have questions about this medicine, talk to your doctor, pharmacist, or health care provider.  2018 Elsevier/Gold Standard (2016-01-10 10:54:35)

## 2017-12-07 ENCOUNTER — Inpatient Hospital Stay: Payer: Medicare Other

## 2017-12-07 ENCOUNTER — Encounter: Payer: Self-pay | Admitting: Oncology

## 2017-12-07 VITALS — BP 134/69 | HR 88 | Temp 97.8°F | Resp 20

## 2017-12-07 DIAGNOSIS — C7951 Secondary malignant neoplasm of bone: Secondary | ICD-10-CM | POA: Diagnosis not present

## 2017-12-07 DIAGNOSIS — Z79818 Long term (current) use of other agents affecting estrogen receptors and estrogen levels: Secondary | ICD-10-CM | POA: Diagnosis not present

## 2017-12-07 DIAGNOSIS — Z5111 Encounter for antineoplastic chemotherapy: Secondary | ICD-10-CM | POA: Diagnosis not present

## 2017-12-07 DIAGNOSIS — Z192 Hormone resistant malignancy status: Secondary | ICD-10-CM | POA: Diagnosis not present

## 2017-12-07 DIAGNOSIS — Z79899 Other long term (current) drug therapy: Secondary | ICD-10-CM | POA: Diagnosis not present

## 2017-12-07 DIAGNOSIS — C61 Malignant neoplasm of prostate: Secondary | ICD-10-CM | POA: Diagnosis not present

## 2017-12-07 MED ORDER — PEGFILGRASTIM-CBQV 6 MG/0.6ML ~~LOC~~ SOSY
6.0000 mg | PREFILLED_SYRINGE | Freq: Once | SUBCUTANEOUS | Status: AC
  Administered 2017-12-07: 6 mg via SUBCUTANEOUS

## 2017-12-07 NOTE — Progress Notes (Signed)
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  Pt has 2 insurances so copay assistance isn't needed.  I informed him of the [facility] and Prostate grants, went over what they cover and gave him the income requirement.  Pt stated he exceeds the requirement so he doesn't qualify for the grants.

## 2017-12-07 NOTE — Patient Instructions (Signed)
Pegfilgrastim injection What is this medicine? PEGFILGRASTIM (PEG fil gra stim) is a long-acting granulocyte colony-stimulating factor that stimulates the growth of neutrophils, a type of white blood cell important in the body's fight against infection. It is used to reduce the incidence of fever and infection in patients with certain types of cancer who are receiving chemotherapy that affects the bone marrow, and to increase survival after being exposed to high doses of radiation. This medicine may be used for other purposes; ask your health care provider or pharmacist if you have questions. COMMON BRAND NAME(S): Neulasta What should I tell my health care provider before I take this medicine? They need to know if you have any of these conditions: -kidney disease -latex allergy -ongoing radiation therapy -sickle cell disease -skin reactions to acrylic adhesives (On-Body Injector only) -an unusual or allergic reaction to pegfilgrastim, filgrastim, other medicines, foods, dyes, or preservatives -pregnant or trying to get pregnant -breast-feeding How should I use this medicine? This medicine is for injection under the skin. If you get this medicine at home, you will be taught how to prepare and give the pre-filled syringe or how to use the On-body Injector. Refer to the patient Instructions for Use for detailed instructions. Use exactly as directed. Tell your healthcare provider immediately if you suspect that the On-body Injector may not have performed as intended or if you suspect the use of the On-body Injector resulted in a missed or partial dose. It is important that you put your used needles and syringes in a special sharps container. Do not put them in a trash can. If you do not have a sharps container, call your pharmacist or healthcare provider to get one. Talk to your pediatrician regarding the use of this medicine in children. While this drug may be prescribed for selected conditions,  precautions do apply. Overdosage: If you think you have taken too much of this medicine contact a poison control center or emergency room at once. NOTE: This medicine is only for you. Do not share this medicine with others. What if I miss a dose? It is important not to miss your dose. Call your doctor or health care professional if you miss your dose. If you miss a dose due to an On-body Injector failure or leakage, a new dose should be administered as soon as possible using a single prefilled syringe for manual use. What may interact with this medicine? Interactions have not been studied. Give your health care provider a list of all the medicines, herbs, non-prescription drugs, or dietary supplements you use. Also tell them if you smoke, drink alcohol, or use illegal drugs. Some items may interact with your medicine. This list may not describe all possible interactions. Give your health care provider a list of all the medicines, herbs, non-prescription drugs, or dietary supplements you use. Also tell them if you smoke, drink alcohol, or use illegal drugs. Some items may interact with your medicine. What should I watch for while using this medicine? You may need blood work done while you are taking this medicine. If you are going to need a MRI, CT scan, or other procedure, tell your doctor that you are using this medicine (On-Body Injector only). What side effects may I notice from receiving this medicine? Side effects that you should report to your doctor or health care professional as soon as possible: -allergic reactions like skin rash, itching or hives, swelling of the face, lips, or tongue -dizziness -fever -pain, redness, or irritation at site   where injected -pinpoint red spots on the skin -red or dark-brown urine -shortness of breath or breathing problems -stomach or side pain, or pain at the shoulder -swelling -tiredness -trouble passing urine or change in the amount of urine Side  effects that usually do not require medical attention (report to your doctor or health care professional if they continue or are bothersome): -bone pain -muscle pain This list may not describe all possible side effects. Call your doctor for medical advice about side effects. You may report side effects to FDA at 1-800-FDA-1088. Where should I keep my medicine? Keep out of the reach of children. Store pre-filled syringes in a refrigerator between 2 and 8 degrees C (36 and 46 degrees F). Do not freeze. Keep in carton to protect from light. Throw away this medicine if it is left out of the refrigerator for more than 48 hours. Throw away any unused medicine after the expiration date. NOTE: This sheet is a summary. It may not cover all possible information. If you have questions about this medicine, talk to your doctor, pharmacist, or health care provider.  2018 Elsevier/Gold Standard (2016-05-18 12:58:03)  

## 2017-12-08 LAB — PROSTATE-SPECIFIC AG, SERUM (LABCORP): PROSTATE SPECIFIC AG, SERUM: 127.8 ng/mL — AB (ref 0.0–4.0)

## 2017-12-11 ENCOUNTER — Telehealth: Payer: Self-pay

## 2017-12-11 NOTE — Telephone Encounter (Signed)
Received VM from pt wife requesting assistance with treating pt for symptom of constipation post chemotherapy start on 12/04/17. Pt normally has regular BM daily, and has had minimal stool from Saturday to today. Wife, Reeta, wanted to confirm pt was okay to take miralax and know if they should try anything else. Per Dr. [name], ok to continue using miralax daily to promote normal stools. No enema recommended at this time. Pt wife verbalized understanding.

## 2017-12-17 DIAGNOSIS — E559 Vitamin D deficiency, unspecified: Secondary | ICD-10-CM | POA: Diagnosis not present

## 2017-12-17 DIAGNOSIS — E782 Mixed hyperlipidemia: Secondary | ICD-10-CM | POA: Diagnosis not present

## 2017-12-17 DIAGNOSIS — E039 Hypothyroidism, unspecified: Secondary | ICD-10-CM | POA: Diagnosis not present

## 2017-12-17 DIAGNOSIS — E871 Hypo-osmolality and hyponatremia: Secondary | ICD-10-CM | POA: Diagnosis not present

## 2017-12-21 DIAGNOSIS — Z6821 Body mass index (BMI) 21.0-21.9, adult: Secondary | ICD-10-CM | POA: Diagnosis not present

## 2017-12-21 DIAGNOSIS — E871 Hypo-osmolality and hyponatremia: Secondary | ICD-10-CM | POA: Diagnosis not present

## 2017-12-21 DIAGNOSIS — R634 Abnormal weight loss: Secondary | ICD-10-CM | POA: Diagnosis not present

## 2017-12-21 DIAGNOSIS — E039 Hypothyroidism, unspecified: Secondary | ICD-10-CM | POA: Diagnosis not present

## 2017-12-21 DIAGNOSIS — E782 Mixed hyperlipidemia: Secondary | ICD-10-CM | POA: Diagnosis not present

## 2017-12-27 ENCOUNTER — Inpatient Hospital Stay: Payer: Medicare Other

## 2017-12-27 ENCOUNTER — Inpatient Hospital Stay (HOSPITAL_BASED_OUTPATIENT_CLINIC_OR_DEPARTMENT_OTHER): Payer: Medicare Other | Admitting: Oncology

## 2017-12-27 ENCOUNTER — Telehealth: Payer: Self-pay | Admitting: Oncology

## 2017-12-27 VITALS — BP 116/60 | HR 86 | Temp 97.4°F | Resp 18 | Ht 68.0 in | Wt 145.8 lb

## 2017-12-27 DIAGNOSIS — Z79899 Other long term (current) drug therapy: Secondary | ICD-10-CM | POA: Diagnosis not present

## 2017-12-27 DIAGNOSIS — C61 Malignant neoplasm of prostate: Secondary | ICD-10-CM | POA: Diagnosis not present

## 2017-12-27 DIAGNOSIS — C7951 Secondary malignant neoplasm of bone: Secondary | ICD-10-CM

## 2017-12-27 DIAGNOSIS — Z5111 Encounter for antineoplastic chemotherapy: Secondary | ICD-10-CM | POA: Diagnosis not present

## 2017-12-27 DIAGNOSIS — Z95828 Presence of other vascular implants and grafts: Secondary | ICD-10-CM

## 2017-12-27 DIAGNOSIS — R5383 Other fatigue: Secondary | ICD-10-CM

## 2017-12-27 DIAGNOSIS — Z7689 Persons encountering health services in other specified circumstances: Secondary | ICD-10-CM

## 2017-12-27 DIAGNOSIS — Z192 Hormone resistant malignancy status: Secondary | ICD-10-CM

## 2017-12-27 DIAGNOSIS — Z7951 Long term (current) use of inhaled steroids: Secondary | ICD-10-CM | POA: Diagnosis not present

## 2017-12-27 DIAGNOSIS — Z79818 Long term (current) use of other agents affecting estrogen receptors and estrogen levels: Secondary | ICD-10-CM | POA: Diagnosis not present

## 2017-12-27 DIAGNOSIS — R630 Anorexia: Secondary | ICD-10-CM

## 2017-12-27 LAB — CBC WITH DIFFERENTIAL (CANCER CENTER ONLY)
BASOS ABS: 0.1 10*3/uL (ref 0.0–0.1)
BASOS PCT: 1 %
Eosinophils Absolute: 0.1 10*3/uL (ref 0.0–0.5)
Eosinophils Relative: 1 %
HEMATOCRIT: 26.7 % — AB (ref 38.4–49.9)
HEMOGLOBIN: 8.9 g/dL — AB (ref 13.0–17.1)
Lymphocytes Relative: 17 %
Lymphs Abs: 1.3 10*3/uL (ref 0.9–3.3)
MCH: 30.6 pg (ref 27.2–33.4)
MCHC: 33.3 g/dL (ref 32.0–36.0)
MCV: 91.8 fL (ref 79.3–98.0)
Monocytes Absolute: 0.9 10*3/uL (ref 0.1–0.9)
Monocytes Relative: 11 %
NEUTROS ABS: 5.6 10*3/uL (ref 1.5–6.5)
NEUTROS PCT: 70 %
NRBC: 3 /100{WBCs} — AB
Platelet Count: 125 10*3/uL — ABNORMAL LOW (ref 140–400)
RBC: 2.91 MIL/uL — AB (ref 4.20–5.82)
RDW: 19.7 % — ABNORMAL HIGH (ref 11.0–14.6)
WBC: 8 10*3/uL (ref 4.0–10.3)

## 2017-12-27 LAB — CMP (CANCER CENTER ONLY)
ALT: 10 U/L (ref 0–44)
ANION GAP: 7 (ref 5–15)
AST: 25 U/L (ref 15–41)
Albumin: 2.9 g/dL — ABNORMAL LOW (ref 3.5–5.0)
Alkaline Phosphatase: 235 U/L — ABNORMAL HIGH (ref 38–126)
BILIRUBIN TOTAL: 0.4 mg/dL (ref 0.3–1.2)
BUN: 22 mg/dL (ref 8–23)
CHLORIDE: 94 mmol/L — AB (ref 98–111)
CO2: 23 mmol/L (ref 22–32)
Calcium: 8.4 mg/dL — ABNORMAL LOW (ref 8.9–10.3)
Creatinine: 0.68 mg/dL (ref 0.61–1.24)
Glucose, Bld: 103 mg/dL — ABNORMAL HIGH (ref 70–99)
POTASSIUM: 4.5 mmol/L (ref 3.5–5.1)
Sodium: 124 mmol/L — ABNORMAL LOW (ref 135–145)
TOTAL PROTEIN: 5.9 g/dL — AB (ref 6.5–8.1)

## 2017-12-27 MED ORDER — SODIUM CHLORIDE 0.9% FLUSH
10.0000 mL | INTRAVENOUS | Status: DC | PRN
  Administered 2017-12-27: 10 mL via INTRAVENOUS
  Filled 2017-12-27: qty 10

## 2017-12-27 MED ORDER — SODIUM CHLORIDE 0.9 % IV SOLN
60.0000 mg/m2 | Freq: Once | INTRAVENOUS | Status: AC
  Administered 2017-12-27: 110 mg via INTRAVENOUS
  Filled 2017-12-27: qty 11

## 2017-12-27 MED ORDER — SODIUM CHLORIDE 0.9% FLUSH
10.0000 mL | INTRAVENOUS | Status: DC | PRN
  Administered 2017-12-27: 10 mL
  Filled 2017-12-27: qty 10

## 2017-12-27 MED ORDER — DEXAMETHASONE SODIUM PHOSPHATE 10 MG/ML IJ SOLN
10.0000 mg | Freq: Once | INTRAMUSCULAR | Status: AC
  Administered 2017-12-27: 10 mg via INTRAVENOUS

## 2017-12-27 MED ORDER — DEXAMETHASONE SODIUM PHOSPHATE 10 MG/ML IJ SOLN
INTRAMUSCULAR | Status: AC
  Filled 2017-12-27: qty 1

## 2017-12-27 MED ORDER — SODIUM CHLORIDE 0.9 % IV SOLN
Freq: Once | INTRAVENOUS | Status: AC
  Administered 2017-12-27: 14:00:00 via INTRAVENOUS
  Filled 2017-12-27: qty 250

## 2017-12-27 MED ORDER — HEPARIN SOD (PORK) LOCK FLUSH 100 UNIT/ML IV SOLN
500.0000 [IU] | Freq: Once | INTRAVENOUS | Status: AC | PRN
  Administered 2017-12-27: 500 [IU]
  Filled 2017-12-27: qty 5

## 2017-12-27 NOTE — Telephone Encounter (Signed)
Scheduled appt per 7/25 los - gave patient AVS and calender per los.  

## 2017-12-27 NOTE — Patient Instructions (Signed)
[facility] Discharge Instructions for Patients Receiving Chemotherapy  Today you received the following chemotherapy agents Taxotere To help prevent nausea and vomiting after your treatment, we encourage you to take your nausea medication as prescribed.   If you develop nausea and vomiting that is not controlled by your nausea medication, call the clinic.   BELOW ARE SYMPTOMS THAT SHOULD BE REPORTED IMMEDIATELY:  *FEVER GREATER THAN 100.5 F  *CHILLS WITH OR WITHOUT FEVER  NAUSEA AND VOMITING THAT IS NOT CONTROLLED WITH YOUR NAUSEA MEDICATION  *UNUSUAL SHORTNESS OF BREATH  *UNUSUAL BRUISING OR BLEEDING  TENDERNESS IN MOUTH AND THROAT WITH OR WITHOUT PRESENCE OF ULCERS  *URINARY PROBLEMS  *BOWEL PROBLEMS  UNUSUAL RASH Items with * indicate a potential emergency and should be followed up as soon as possible.  Feel free to call the clinic should you have any questions or concerns. The clinic phone number is [phone].  Please show the CHEMO ALERT CARD at check-in to the Emergency Department and triage nurse.   

## 2017-12-27 NOTE — Progress Notes (Signed)
Hematology and Oncology Follow Up Visit  Lucas Holloway [id number] [birth year] 77 y.o. 12/27/2017 12:57 PM [name], MDDewey, [name], MD   Principle Diagnosis: 77 year old man with castration-resistant prostate cancer diagnosed in 2017 after initial diagnosis of a Gleason score 6, PSA of 9 in 2009.  He has documented bony metastasis.   Prior Therapy: He was treated with brachytherapy with excellent PSA response initially. His PSA nadir was 0.7 and 2013.   He developed metastases in October 2016 with bone metastasis. He was started on androgen deprivation under the care of Dr. [name] and his PSA did drop down initially to 0.61 in May 2017. He developed castration resistant disease in 2017. Zytiga 1000 mg daily with prednisone 5 mg daily started on 01/07/2016. Therapy discontinued and 2018 because of progression of disease. Xtandi 160 mg daily started in August 2018. The dose was reduced to 80 mg daily starting on 02/12/2017 for better tolerance.  Therapy discontinued in October 2018 because of poor tolerance and progression of disease. Xofigo infusion on a monthly basis started on May 04, 2017.  He completed 6 treatments in April 2019.  Current therapy:   Taxotere chemotherapy started on December 04, 2017.  Here for cycle 2 of therapy.  He is receiving Xgeva every 8 weeks.  This is been on hold because of dental procedures.  He is currently on Lupron at 30 mg every 4 months.  Last injection was given on December 04, 2017.  Interim History: Lucas Holloway is here for a follow-up visit.  Since the last visit, he received the first cycle of chemotherapy without any complications.  He does report mild fatigue that has been manageable.  He still ambulate with the help of a walker without any falls or syncope.  He denies any nausea, vomiting or diarrhea.  He denies any neuropathy or infusion related complications.  He has received dentures and is able to eat solid food now for the last 24 hours.  His  appetite has been good but have lost weight given his inability to eat.  His quality of life and performance status remains stable.  He does not report any headaches, blurry vision, syncope.  He denies any dizziness or confusion.  He does not report any fevers or chills or sweats. He does not report any cough, wheezing or hemoptysis. He does not report any chest pain, palpitation orthopnea. He does not report any nausea, vomiting or abdominal pain.  He denies any constipation or diarrhea.  He denies any hematochezia or melena.  He does not report any frequency urgency or hesitancy. He does not report any arthralgias or myalgias.  He does not report any lymphadenopathy or petechiae. He does not report any heat or cold intolerance.  Remaining review of systems is negative.  Medications: I have reviewed the patient's current medications.  Current Outpatient Medications  Medication Sig Dispense Refill  . calcium-vitamin D (OSCAL WITH D) 500-200 MG-UNIT per tablet Take 1 tablet by mouth 2 (two) times daily.    . Degarelix Acetate (FIRMAGON [place]) One injection a month    . Denosumab (XGEVA [place]) One injection a month    . fish oil-omega-3 fatty acids 1000 MG capsule Take 1 g by mouth daily.     . folic acid (FOLVITE) 1 MG tablet Take 1 mg by mouth daily.    Marland Kitchen levothyroxine (SYNTHROID, LEVOTHROID) 75 MCG tablet Take 75 mcg by mouth daily before breakfast.   3  . lidocaine-prilocaine (EMLA) cream Apply 1  application topically as needed. 30 g 0  . meloxicam (MOBIC) 7.5 MG tablet Take 7.5 mg by mouth daily.    . methotrexate (RHEUMATREX) 2.5 MG tablet Take 15 mg by mouth once a week. Takes on sundays.Marland KitchenMarland KitchenCaution:Chemotherapy. Protect from light.    . Multiple Vitamin (MULTIVITAMIN WITH MINERALS) TABS Take 1 tablet by mouth daily.    . prochlorperazine (COMPAZINE) 10 MG tablet TAKE 1 TABLET(10 MG) BY MOUTH EVERY 6 HOURS AS NEEDED FOR NAUSEA OR VOMITING 385 tablet 0  . Vitamin D, Ergocalciferol, (DRISDOL) 50000  units CAPS capsule Take 50,000 Units by mouth daily.    . vitamin E 400 UNIT capsule Take 800 Units by mouth daily.      No current facility-administered medications for this visit.    Facility-Administered Medications Ordered in Other Visits  Medication Dose Route Frequency Provider Last Rate Last Dose  . sodium chloride flush (NS) 0.9 % injection 10 mL  10 mL Intravenous PRN Wyatt Portela, MD   10 mL at 12/27/17 1242     Allergies: No Known Allergies  Past Medical History, Surgical history, Social history, and Family History reviewed today and remain unchanged.  Physical Exam: Blood pressure 116/60, pulse 86, temperature (!) 97.4 F (36.3 C), temperature source Oral, resp. rate 18, height 5\' 8"  (1.727 m), weight 145 lb 12.8 oz (66.1 kg), SpO2 100 %.    ECOG: 1 General appearance: Alert, awake gentleman without distress. Head: Cephalic without normalities. Oropharynx: No oral lesions or ulcers. Eyes: Pupils are equal and round reactive to light. Lymph nodes: No lymphadenopathy noted in the cervical, supraclavicular, or axillary lymph node regions. Heart: Regular rate without any murmurs or gallops.  S1 and S2 and no leg edema. Lung: Clear that any rhonchi, wheezes or dullness to percussion. Abdomin: Soft, nontender without any rebound or guarding.  Good bowel sounds without hepatosplenomegaly. Musculoskeletal: No rubbing or cyanosis. Neurological: No motor or sensory deficits. Psychiatric: Mood and affect appeared appropriate.  Lab Results: Lab Results  Component Value Date   WBC 5.5 12/04/2017   HGB 11.3 (L) 12/04/2017   HCT 33.8 (L) 12/04/2017   MCV 90.1 12/04/2017   PLT 225 12/04/2017     Chemistry      Component Value Date/Time   NA 126 (L) 12/04/2017 0916   NA 125 (L) 06/01/2017 1406   K 4.7 12/04/2017 0916   K 4.6 06/01/2017 1406   CL 94 (L) 12/04/2017 0916   CO2 26 12/04/2017 0916   CO2 23 06/01/2017 1406   BUN 15 12/04/2017 0916   BUN 20.6 06/01/2017  1406   CREATININE 0.66 12/04/2017 0916   CREATININE 0.7 06/01/2017 1406      Component Value Date/Time   CALCIUM 9.1 12/04/2017 0916   CALCIUM 9.1 06/01/2017 1406   ALKPHOS 561 (H) 12/04/2017 0916   ALKPHOS 94 06/01/2017 1406   AST 30 12/04/2017 0916   AST 29 06/01/2017 1406   ALT 16 12/04/2017 0916   ALT 20 06/01/2017 1406   BILITOT 0.3 12/04/2017 0916   BILITOT 0.40 06/01/2017 1406       Impression and Plan:  77 year old man with:   1. Castration-resistant prostate cancer with initial diagnosis in 2009 and has documented bony metastasis in 2017.  He is currently receiving Taxotere chemotherapy received the first cycle on 12/04/2017.  He tolerated therapy reasonably well without any complications.  Risks and benefits of continuing this therapy was discussed today and he is agreeable to continue.  He will receive topical  2 and 3 weeks and cycle 3 in 6 weeks.  We will continue to monitor his PSA periodically after a rapid rise up to 127 prior to the start of chemotherapy.  2. Bone directed therapy: Delton See will be resumed in the future once his dental issue completely resolved.  3. Androgen depravation: Lupron given every 4 months.  Risks and benefits of continuing this treatment was discussed today and he will receive his next injection after April 06, 2018.  4.  Anorexia: His appetite is improving although his weight still down.  He has dentures in place which allow him to eat better.  5.  Dental concerns: He has dental implants placed without complications.  6.  IV access: Port-A-Cath  inserted without complications.  His Port-A-Cath will remain in use for chemotherapy.  7.  Antiemetics: Nausea or vomiting reported at this time.  8.  Growth factor support: He will receive growth factor support after each cycle of chemotherapy with neutropenia prophylaxis.  9.  Prognosis and goals of care: Treatment remains palliative at this time although his performance status is excellent  and aggressive therapy is warranted.  10. Follow-up: In 3 weeks for the next cycle of chemotherapy.  25  minutes was spent with the patient face-to-face today.  More than 50% of time was dedicated to patient counseling, education, and discussing the natural course of this disease and future treatment plans.     Zola Button, MD 7/25/201912:57 PM

## 2017-12-28 ENCOUNTER — Telehealth: Payer: Self-pay | Admitting: *Deleted

## 2017-12-28 ENCOUNTER — Inpatient Hospital Stay: Payer: Medicare Other

## 2017-12-28 VITALS — BP 127/69 | HR 85 | Temp 97.9°F | Resp 20

## 2017-12-28 DIAGNOSIS — Z79899 Other long term (current) drug therapy: Secondary | ICD-10-CM | POA: Diagnosis not present

## 2017-12-28 DIAGNOSIS — Z5111 Encounter for antineoplastic chemotherapy: Secondary | ICD-10-CM | POA: Diagnosis not present

## 2017-12-28 DIAGNOSIS — C61 Malignant neoplasm of prostate: Secondary | ICD-10-CM | POA: Diagnosis not present

## 2017-12-28 DIAGNOSIS — C7951 Secondary malignant neoplasm of bone: Secondary | ICD-10-CM

## 2017-12-28 DIAGNOSIS — Z192 Hormone resistant malignancy status: Secondary | ICD-10-CM | POA: Diagnosis not present

## 2017-12-28 DIAGNOSIS — Z79818 Long term (current) use of other agents affecting estrogen receptors and estrogen levels: Secondary | ICD-10-CM | POA: Diagnosis not present

## 2017-12-28 LAB — PROSTATE-SPECIFIC AG, SERUM (LABCORP): Prostate Specific Ag, Serum: 179 ng/mL — ABNORMAL HIGH (ref 0.0–4.0)

## 2017-12-28 MED ORDER — PEGFILGRASTIM-CBQV 6 MG/0.6ML ~~LOC~~ SOSY
PREFILLED_SYRINGE | SUBCUTANEOUS | Status: AC
  Filled 2017-12-28: qty 0.6

## 2017-12-28 MED ORDER — PEGFILGRASTIM-CBQV 6 MG/0.6ML ~~LOC~~ SOSY
6.0000 mg | PREFILLED_SYRINGE | Freq: Once | SUBCUTANEOUS | Status: AC
  Administered 2017-12-28: 6 mg via SUBCUTANEOUS

## 2017-12-28 NOTE — Patient Instructions (Signed)
Pegfilgrastim injection What is this medicine? PEGFILGRASTIM (PEG fil gra stim) is a long-acting granulocyte colony-stimulating factor that stimulates the growth of neutrophils, a type of white blood cell important in the body's fight against infection. It is used to reduce the incidence of fever and infection in patients with certain types of cancer who are receiving chemotherapy that affects the bone marrow, and to increase survival after being exposed to high doses of radiation. This medicine may be used for other purposes; ask your health care provider or pharmacist if you have questions. COMMON BRAND NAME(S): Neulasta What should I tell my health care provider before I take this medicine? They need to know if you have any of these conditions: -kidney disease -latex allergy -ongoing radiation therapy -sickle cell disease -skin reactions to acrylic adhesives (On-Body Injector only) -an unusual or allergic reaction to pegfilgrastim, filgrastim, other medicines, foods, dyes, or preservatives -pregnant or trying to get pregnant -breast-feeding How should I use this medicine? This medicine is for injection under the skin. If you get this medicine at home, you will be taught how to prepare and give the pre-filled syringe or how to use the On-body Injector. Refer to the patient Instructions for Use for detailed instructions. Use exactly as directed. Tell your healthcare provider immediately if you suspect that the On-body Injector may not have performed as intended or if you suspect the use of the On-body Injector resulted in a missed or partial dose. It is important that you put your used needles and syringes in a special sharps container. Do not put them in a trash can. If you do not have a sharps container, call your pharmacist or healthcare provider to get one. Talk to your pediatrician regarding the use of this medicine in children. While this drug may be prescribed for selected conditions,  precautions do apply. Overdosage: If you think you have taken too much of this medicine contact a poison control center or emergency room at once. NOTE: This medicine is only for you. Do not share this medicine with others. What if I miss a dose? It is important not to miss your dose. Call your doctor or health care professional if you miss your dose. If you miss a dose due to an On-body Injector failure or leakage, a new dose should be administered as soon as possible using a single prefilled syringe for manual use. What may interact with this medicine? Interactions have not been studied. Give your health care provider a list of all the medicines, herbs, non-prescription drugs, or dietary supplements you use. Also tell them if you smoke, drink alcohol, or use illegal drugs. Some items may interact with your medicine. This list may not describe all possible interactions. Give your health care provider a list of all the medicines, herbs, non-prescription drugs, or dietary supplements you use. Also tell them if you smoke, drink alcohol, or use illegal drugs. Some items may interact with your medicine. What should I watch for while using this medicine? You may need blood work done while you are taking this medicine. If you are going to need a MRI, CT scan, or other procedure, tell your doctor that you are using this medicine (On-Body Injector only). What side effects may I notice from receiving this medicine? Side effects that you should report to your doctor or health care professional as soon as possible: -allergic reactions like skin rash, itching or hives, swelling of the face, lips, or tongue -dizziness -fever -pain, redness, or irritation at site   where injected -pinpoint red spots on the skin -red or dark-brown urine -shortness of breath or breathing problems -stomach or side pain, or pain at the shoulder -swelling -tiredness -trouble passing urine or change in the amount of urine Side  effects that usually do not require medical attention (report to your doctor or health care professional if they continue or are bothersome): -bone pain -muscle pain This list may not describe all possible side effects. Call your doctor for medical advice about side effects. You may report side effects to FDA at 1-800-FDA-1088. Where should I keep my medicine? Keep out of the reach of children. Store pre-filled syringes in a refrigerator between 2 and 8 degrees C (36 and 46 degrees F). Do not freeze. Keep in carton to protect from light. Throw away this medicine if it is left out of the refrigerator for more than 48 hours. Throw away any unused medicine after the expiration date. NOTE: This sheet is a summary. It may not cover all possible information. If you have questions about this medicine, talk to your doctor, pharmacist, or health care provider.  2018 Elsevier/Gold Standard (2016-05-18 12:58:03)  

## 2017-12-28 NOTE — Telephone Encounter (Signed)
-----   Message from Wyatt Portela, MD sent at 12/28/2017  7:52 AM EDT ----- Please let him know his PSA is up. No change for now. I anticipate PSA drop after few cycles of Taxotere.

## 2017-12-28 NOTE — Telephone Encounter (Signed)
As noted below by Dr. Shadad, I informed patient of his PSA level. He verbalized understanding.  

## 2018-01-07 ENCOUNTER — Other Ambulatory Visit: Payer: Self-pay | Admitting: *Deleted

## 2018-01-07 ENCOUNTER — Inpatient Hospital Stay: Payer: Medicare Other | Attending: Oncology | Admitting: Medical

## 2018-01-07 ENCOUNTER — Observation Stay (HOSPITAL_COMMUNITY): Payer: Medicare Other

## 2018-01-07 ENCOUNTER — Telehealth: Payer: Self-pay | Admitting: *Deleted

## 2018-01-07 ENCOUNTER — Inpatient Hospital Stay (HOSPITAL_COMMUNITY)
Admission: EM | Admit: 2018-01-07 | Discharge: 2018-01-10 | DRG: 392 | Disposition: A | Discharge status: Home Health | Payer: Medicare Other | Attending: Internal Medicine | Admitting: Internal Medicine

## 2018-01-07 ENCOUNTER — Other Ambulatory Visit: Payer: Self-pay

## 2018-01-07 ENCOUNTER — Inpatient Hospital Stay: Payer: Medicare Other

## 2018-01-07 ENCOUNTER — Encounter (HOSPITAL_COMMUNITY): Payer: Self-pay | Admitting: Emergency Medicine

## 2018-01-07 VITALS — BP 94/47 | HR 98 | Temp 98.0°F | Resp 18 | Wt 147.5 lb

## 2018-01-07 DIAGNOSIS — Z192 Hormone resistant malignancy status: Secondary | ICD-10-CM | POA: Insufficient documentation

## 2018-01-07 DIAGNOSIS — E871 Hypo-osmolality and hyponatremia: Secondary | ICD-10-CM | POA: Diagnosis not present

## 2018-01-07 DIAGNOSIS — Z5111 Encounter for antineoplastic chemotherapy: Secondary | ICD-10-CM | POA: Insufficient documentation

## 2018-01-07 DIAGNOSIS — Z87891 Personal history of nicotine dependence: Secondary | ICD-10-CM

## 2018-01-07 DIAGNOSIS — D649 Anemia, unspecified: Secondary | ICD-10-CM | POA: Diagnosis present

## 2018-01-07 DIAGNOSIS — C61 Malignant neoplasm of prostate: Secondary | ICD-10-CM

## 2018-01-07 DIAGNOSIS — L4050 Arthropathic psoriasis, unspecified: Secondary | ICD-10-CM

## 2018-01-07 DIAGNOSIS — Z9221 Personal history of antineoplastic chemotherapy: Secondary | ICD-10-CM

## 2018-01-07 DIAGNOSIS — K449 Diaphragmatic hernia without obstruction or gangrene: Secondary | ICD-10-CM | POA: Diagnosis not present

## 2018-01-07 DIAGNOSIS — C7951 Secondary malignant neoplasm of bone: Secondary | ICD-10-CM

## 2018-01-07 DIAGNOSIS — Z7689 Persons encountering health services in other specified circumstances: Secondary | ICD-10-CM | POA: Diagnosis not present

## 2018-01-07 DIAGNOSIS — K5900 Constipation, unspecified: Secondary | ICD-10-CM | POA: Diagnosis present

## 2018-01-07 DIAGNOSIS — K921 Melena: Secondary | ICD-10-CM | POA: Diagnosis not present

## 2018-01-07 DIAGNOSIS — D352 Benign neoplasm of pituitary gland: Secondary | ICD-10-CM | POA: Diagnosis present

## 2018-01-07 DIAGNOSIS — D638 Anemia in other chronic diseases classified elsewhere: Secondary | ICD-10-CM | POA: Diagnosis present

## 2018-01-07 DIAGNOSIS — K219 Gastro-esophageal reflux disease without esophagitis: Secondary | ICD-10-CM | POA: Diagnosis not present

## 2018-01-07 DIAGNOSIS — D509 Iron deficiency anemia, unspecified: Secondary | ICD-10-CM | POA: Diagnosis not present

## 2018-01-07 DIAGNOSIS — Z66 Do not resuscitate: Secondary | ICD-10-CM | POA: Diagnosis present

## 2018-01-07 DIAGNOSIS — Z79890 Hormone replacement therapy (postmenopausal): Secondary | ICD-10-CM

## 2018-01-07 DIAGNOSIS — Z809 Family history of malignant neoplasm, unspecified: Secondary | ICD-10-CM

## 2018-01-07 DIAGNOSIS — T39395A Adverse effect of other nonsteroidal anti-inflammatory drugs [NSAID], initial encounter: Secondary | ICD-10-CM | POA: Diagnosis present

## 2018-01-07 DIAGNOSIS — K210 Gastro-esophageal reflux disease with esophagitis: Secondary | ICD-10-CM | POA: Diagnosis not present

## 2018-01-07 DIAGNOSIS — Z79899 Other long term (current) drug therapy: Secondary | ICD-10-CM

## 2018-01-07 DIAGNOSIS — D72829 Elevated white blood cell count, unspecified: Secondary | ICD-10-CM | POA: Diagnosis present

## 2018-01-07 LAB — CMP (CANCER CENTER ONLY)
ALK PHOS: 174 U/L — AB (ref 38–126)
ALT: 11 U/L (ref 0–44)
ANION GAP: 9 (ref 5–15)
AST: 29 U/L (ref 15–41)
Albumin: 2.4 g/dL — ABNORMAL LOW (ref 3.5–5.0)
BILIRUBIN TOTAL: 0.3 mg/dL (ref 0.3–1.2)
BUN: 13 mg/dL (ref 8–23)
CALCIUM: 7.6 mg/dL — AB (ref 8.9–10.3)
CO2: 22 mmol/L (ref 22–32)
CREATININE: 0.61 mg/dL (ref 0.61–1.24)
Chloride: 90 mmol/L — ABNORMAL LOW (ref 98–111)
GFR, Est AFR Am: >60 mL/min (ref >60)
GFR, Estimated: >60 mL/min (ref >60)
Glucose, Bld: 94 mg/dL (ref 70–99)
Potassium: 4.8 mmol/L (ref 3.5–5.1)
SODIUM: 121 mmol/L — AB (ref 135–145)
TOTAL PROTEIN: 5.1 g/dL — AB (ref 6.5–8.1)

## 2018-01-07 LAB — COMPREHENSIVE METABOLIC PANEL
ALT: 13 U/L (ref 0–44)
ANION GAP: 8 (ref 5–15)
AST: 29 U/L (ref 15–41)
Albumin: 2.5 g/dL — ABNORMAL LOW (ref 3.5–5.0)
Alkaline Phosphatase: 158 U/L — ABNORMAL HIGH (ref 38–126)
BUN: 17 mg/dL (ref 8–23)
CALCIUM: 7.5 mg/dL — AB (ref 8.9–10.3)
CO2: 24 mmol/L (ref 22–32)
CREATININE: 0.5 mg/dL — AB (ref 0.61–1.24)
Chloride: 91 mmol/L — ABNORMAL LOW (ref 98–111)
GFR calc non Af Amer: >60 mL/min (ref >60)
GLUCOSE: 93 mg/dL (ref 70–99)
POTASSIUM: 4.8 mmol/L (ref 3.5–5.1)
SODIUM: 123 mmol/L — AB (ref 135–145)
Total Bilirubin: 0.5 mg/dL (ref 0.3–1.2)
Total Protein: 5.1 g/dL — ABNORMAL LOW (ref 6.5–8.1)

## 2018-01-07 LAB — CBC WITH DIFFERENTIAL/PLATELET
BASOS PCT: 0 %
Basophils Absolute: 0 10*3/uL (ref 0.0–0.1)
EOS PCT: 0 %
Eosinophils Absolute: 0 10*3/uL (ref 0.0–0.7)
HEMATOCRIT: 18.7 % — AB (ref 39.0–52.0)
HEMOGLOBIN: 6.2 g/dL — AB (ref 13.0–17.0)
LYMPHS ABS: 1.2 10*3/uL (ref 0.7–4.0)
Lymphocytes Relative: 6 %
MCH: 31.6 pg (ref 26.0–34.0)
MCHC: 33.2 g/dL (ref 30.0–36.0)
MCV: 95.4 fL (ref 78.0–100.0)
MONO ABS: 1.2 10*3/uL — AB (ref 0.1–1.0)
Monocytes Relative: 6 %
NEUTROS PCT: 88 %
Neutro Abs: 16.9 10*3/uL — ABNORMAL HIGH (ref 1.7–7.7)
Platelets: 134 10*3/uL — ABNORMAL LOW (ref 150–400)
RBC: 1.96 MIL/uL — AB (ref 4.22–5.81)
RDW: 20.9 % — AB (ref 11.5–15.5)
WBC Morphology: INCREASED
WBC: 19.3 10*3/uL — AB (ref 4.0–10.5)

## 2018-01-07 LAB — CBC WITH DIFFERENTIAL (CANCER CENTER ONLY)
BASOS ABS: 0.1 10*3/uL (ref 0.0–0.1)
BASOS PCT: 0 %
EOS ABS: 0 10*3/uL (ref 0.0–0.5)
Eosinophils Relative: 0 %
HCT: 18.2 % — ABNORMAL LOW (ref 38.4–49.9)
Hemoglobin: 5.9 g/dL — CL (ref 13.0–17.1)
Lymphocytes Relative: 7 %
Lymphs Abs: 1.3 10*3/uL (ref 0.9–3.3)
MCH: 30.6 pg (ref 27.2–33.4)
MCHC: 32.4 g/dL (ref 32.0–36.0)
MCV: 94.3 fL (ref 79.3–98.0)
MONO ABS: 1.6 10*3/uL — AB (ref 0.1–0.9)
MONOS PCT: 8 %
Neutro Abs: 16.2 10*3/uL — ABNORMAL HIGH (ref 1.5–6.5)
Neutrophils Relative %: 85 %
PLATELETS: 119 10*3/uL — AB (ref 140–400)
RBC: 1.93 MIL/uL — ABNORMAL LOW (ref 4.20–5.82)
RDW: 20.8 % — AB (ref 11.0–14.6)
WBC Count: 19.3 10*3/uL — ABNORMAL HIGH (ref 4.0–10.3)
nRBC: 4 /100 WBC — ABNORMAL HIGH

## 2018-01-07 LAB — I-STAT CG4 LACTIC ACID, ED: Lactic Acid, Venous: 1.49 mmol/L (ref 0.5–1.9)

## 2018-01-07 LAB — URINALYSIS, ROUTINE W REFLEX MICROSCOPIC
BILIRUBIN URINE: NEGATIVE
Glucose, UA: NEGATIVE mg/dL
HGB URINE DIPSTICK: NEGATIVE
Ketones, ur: NEGATIVE mg/dL
Leukocytes, UA: NEGATIVE
Nitrite: NEGATIVE
PH: 6 (ref 5.0–8.0)
Protein, ur: NEGATIVE mg/dL
SPECIFIC GRAVITY, URINE: 1.015 (ref 1.005–1.030)

## 2018-01-07 LAB — ABO/RH: ABO/RH(D): O POS

## 2018-01-07 LAB — POC OCCULT BLOOD, ED: Fecal Occult Bld: POSITIVE — AB

## 2018-01-07 LAB — PROTIME-INR
INR: 1.04
Prothrombin Time: 13.5 seconds (ref 11.4–15.2)

## 2018-01-07 LAB — PREPARE RBC (CROSSMATCH)

## 2018-01-07 MED ORDER — SODIUM CHLORIDE 0.9 % IV SOLN: 250.0000 mL | INTRAVENOUS | Status: DC | PRN

## 2018-01-07 MED ORDER — ACETAMINOPHEN 325 MG PO TABS: 650.0000 mg | ORAL_TABLET | Freq: Four times a day (QID) | ORAL | Status: DC | PRN

## 2018-01-07 MED ORDER — ONDANSETRON HCL 4 MG PO TABS
4.0000 mg | ORAL_TABLET | Freq: Four times a day (QID) | ORAL | Status: DC | PRN
Start: 2018-01-07 — End: 2018-01-10

## 2018-01-07 MED ORDER — CALCIUM CARBONATE-VITAMIN D 500-200 MG-UNIT PO TABS
1.0000 | ORAL_TABLET | Freq: Two times a day (BID) | ORAL | Status: DC
  Administered 2018-01-07 – 2018-01-10 (×5): 1 via ORAL
  Filled 2018-01-07 (×5): qty 1

## 2018-01-07 MED ORDER — VITAMIN E 180 MG (400 UNIT) PO CAPS
800.0000 [IU] | ORAL_CAPSULE | Freq: Every day | ORAL | Status: DC
  Administered 2018-01-09 – 2018-01-10 (×2): 800 [IU] via ORAL
  Filled 2018-01-07 (×3): qty 2

## 2018-01-07 MED ORDER — SODIUM CHLORIDE 0.9 % IV SOLN
INTRAVENOUS | Status: DC
Start: 2018-01-07 — End: 2018-01-08

## 2018-01-07 MED ORDER — SODIUM CHLORIDE 0.9 % IV BOLUS
1000.0000 mL | Freq: Once | INTRAVENOUS | Status: AC
  Administered 2018-01-07: 1000 mL via INTRAVENOUS

## 2018-01-07 MED ORDER — LEVOTHYROXINE SODIUM 75 MCG PO TABS
75.0000 ug | ORAL_TABLET | Freq: Every day | ORAL | Status: DC
  Administered 2018-01-08 – 2018-01-10 (×3): 75 ug via ORAL
  Filled 2018-01-07 (×3): qty 1

## 2018-01-07 MED ORDER — VITAMIN D3 25 MCG (1000 UNIT) PO TABS
5000.0000 [IU] | ORAL_TABLET | Freq: Every day | ORAL | Status: DC
  Administered 2018-01-09: 5000 [IU] via ORAL
  Filled 2018-01-07: qty 5

## 2018-01-07 MED ORDER — IOPAMIDOL (ISOVUE-300) INJECTION 61%
100.0000 mL | Freq: Once | INTRAVENOUS | Status: AC | PRN
  Administered 2018-01-07: 100 mL via INTRAVENOUS

## 2018-01-07 MED ORDER — FOLIC ACID 1 MG PO TABS
1.0000 mg | ORAL_TABLET | Freq: Every day | ORAL | Status: DC
  Administered 2018-01-07 – 2018-01-10 (×3): 1 mg via ORAL
  Filled 2018-01-07 (×3): qty 1

## 2018-01-07 MED ORDER — SODIUM CHLORIDE 0.9 % IV SOLN
INTRAVENOUS | Status: DC
  Administered 2018-01-08 – 2018-01-09 (×2): via INTRAVENOUS

## 2018-01-07 MED ORDER — PANTOPRAZOLE SODIUM 40 MG IV SOLR
40.0000 mg | Freq: Once | INTRAVENOUS | Status: AC
  Administered 2018-01-07: 40 mg via INTRAVENOUS
  Filled 2018-01-07: qty 40

## 2018-01-07 MED ORDER — TRAZODONE HCL 50 MG PO TABS: 50.0000 mg | ORAL_TABLET | Freq: Every evening | ORAL | Status: DC | PRN

## 2018-01-07 MED ORDER — LEUPROLIDE ACETATE (4 MONTH) 30 MG IM KIT: 30.0000 mg | PACK | INTRAMUSCULAR | Status: DC

## 2018-01-07 MED ORDER — OXYCODONE HCL 5 MG PO TABS: 5.0000 mg | ORAL_TABLET | ORAL | Status: DC | PRN

## 2018-01-07 MED ORDER — SODIUM CHLORIDE 0.9% IV SOLUTION: Freq: Once | INTRAVENOUS | Status: DC

## 2018-01-07 MED ORDER — IOPAMIDOL (ISOVUE-300) INJECTION 61%
INTRAVENOUS | Status: AC
  Filled 2018-01-07: qty 100

## 2018-01-07 MED ORDER — OMEGA-3-ACID ETHYL ESTERS 1 G PO CAPS
1.0000 g | ORAL_CAPSULE | Freq: Every day | ORAL | Status: DC
  Administered 2018-01-09 – 2018-01-10 (×2): 1 g via ORAL
  Filled 2018-01-07 (×2): qty 1

## 2018-01-07 MED ORDER — SODIUM CHLORIDE 0.9% FLUSH
3.0000 mL | Freq: Two times a day (BID) | INTRAVENOUS | Status: DC
  Administered 2018-01-08 – 2018-01-09 (×2): 3 mL via INTRAVENOUS

## 2018-01-07 MED ORDER — MORPHINE SULFATE (PF) 2 MG/ML IV SOLN: 2.0000 mg | INTRAVENOUS | Status: DC | PRN

## 2018-01-07 MED ORDER — ADULT MULTIVITAMIN W/MINERALS CH
1.0000 | ORAL_TABLET | Freq: Every day | ORAL | Status: DC
Start: 2018-01-08 — End: 2018-01-10
  Administered 2018-01-09: 1 via ORAL
  Filled 2018-01-07: qty 1

## 2018-01-07 MED ORDER — POLYETHYLENE GLYCOL 3350 17 G PO PACK: 17.0000 g | PACK | Freq: Every day | ORAL | Status: DC | PRN

## 2018-01-07 MED ORDER — ACETAMINOPHEN 650 MG RE SUPP: 650.0000 mg | Freq: Four times a day (QID) | RECTAL | Status: DC | PRN

## 2018-01-07 MED ORDER — PANTOPRAZOLE SODIUM 40 MG IV SOLR
40.0000 mg | Freq: Two times a day (BID) | INTRAVENOUS | Status: DC
  Administered 2018-01-08 – 2018-01-10 (×5): 40 mg via INTRAVENOUS
  Filled 2018-01-07 (×5): qty 40

## 2018-01-07 MED ORDER — ALBUTEROL SULFATE (2.5 MG/3ML) 0.083% IN NEBU: 2.5000 mg | INHALATION_SOLUTION | RESPIRATORY_TRACT | Status: DC | PRN

## 2018-01-07 MED ORDER — FUROSEMIDE 10 MG/ML IJ SOLN
40.0000 mg | Freq: Once | INTRAMUSCULAR | Status: AC
  Administered 2018-01-07: 40 mg via INTRAVENOUS
  Filled 2018-01-07: qty 4

## 2018-01-07 MED ORDER — ONDANSETRON HCL 4 MG/2ML IJ SOLN: 4.0000 mg | Freq: Four times a day (QID) | INTRAMUSCULAR | Status: DC | PRN

## 2018-01-07 MED ORDER — SODIUM CHLORIDE 0.9% FLUSH: 3.0000 mL | INTRAVENOUS | Status: DC | PRN

## 2018-01-07 MED ORDER — PANTOPRAZOLE SODIUM 40 MG PO TBEC: 40.0000 mg | DELAYED_RELEASE_TABLET | Freq: Two times a day (BID) | ORAL | Status: DC

## 2018-01-07 NOTE — ED Notes (Signed)
ED TO INPATIENT HANDOFF REPORT  Name/Age/Gender Lucas Holloway 77 y.o. male  Code Status    Code Status Orders  (From admission, onward)        Start     Ordered   01/07/18 1837  Do not attempt resuscitation (DNR)  Continuous    Question Answer Comment  In the event of cardiac or respiratory ARREST Do not call a "code blue"   In the event of cardiac or respiratory ARREST Do not perform Intubation, CPR, defibrillation or ACLS   In the event of cardiac or respiratory ARREST Use medication by any route, position, wound care, and other measures to relive pain and suffering. May use oxygen, suction and manual treatment of airway obstruction as needed for comfort.   Comments d/w pt and wife at bedside      01/07/18 1836    Code Status History    Date Active Date Inactive Code Status Order ID Comments User Context   01/07/2018 1836 01/07/2018 1836 Full Code [id number]  Roxan Hockey, MD ED      Home/SNF/Other Home  Chief Complaint profound anemia  Level of Care/Admitting Diagnosis ED Disposition    ED Disposition Condition [facility] Area: [facility] [[id number]]  Level of Care: Telemetry [5]  Admit to tele based on following criteria: Monitor for Ischemic changes  Diagnosis: Anemia [829562]  Admitting Physician: Morrison Old  Attending Physician: Morrison Old  Bed request comments: tele  PT Class (Do Not Modify): Observation [104]  PT Acc Code (Do Not Modify): Observation [10022]       Medical History Past Medical History:  Diagnosis Date  . 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 Prostate cancer ([place])     Allergies No Known Allergies  IV Location/Drains/Wounds Patient Lines/Drains/Airways Status   Active Line/Drains/Airways    Name:   Placement date:   Placement time:   Site:   Days:   Implanted Port 11/28/17 Right Chest   11/28/17    1044    Chest   40   Urethral Catheter Straight-tip 14 Fr.    02/14/12    2008    Straight-tip   2154          Labs/Imaging Results for orders placed or performed during the hospital encounter of 01/07/18 (from the past 48 hour(s))  Comprehensive metabolic panel     Status: Abnormal   Collection Time: 01/07/18  4:58 PM  Result Value Ref Range   Sodium 123 (L) 135 - 145 mmol/L   Potassium 4.8 3.5 - 5.1 mmol/L   Chloride 91 (L) 98 - 111 mmol/L   CO2 24 22 - 32 mmol/L   Glucose, Bld 93 70 - 99 mg/dL   BUN 17 8 - 23 mg/dL   Creatinine, Ser 0.50 (L) 0.61 - 1.24 mg/dL   Calcium 7.5 (L) 8.9 - 10.3 mg/dL   Total Protein 5.1 (L) 6.5 - 8.1 g/dL   Albumin 2.5 (L) 3.5 - 5.0 g/dL   AST 29 15 - 41 U/L   ALT 13 0 - 44 U/L   Alkaline Phosphatase 158 (H) 38 - 126 U/L   Total Bilirubin 0.5 0.3 - 1.2 mg/dL   GFR calc non Af Amer >60 >60 mL/min   GFR calc Af Amer >60 >60 mL/min    Comment: (NOTE) The eGFR has been calculated using the CKD EPI equation. This calculation has not been validated in all clinical situations. eGFR's persistently <60 mL/min signify  possible Chronic Kidney Disease.    Anion gap 8 5 - 15    Comment: Performed at [facility], [place] [address] [place], [place] [address]  Protime-INR     Status: None   Collection Time: 01/07/18  4:58 PM  Result Value Ref Range   Prothrombin Time 13.5 11.4 - 15.2 seconds   INR 1.04     Comment: Performed at [facility], [place] [address], [place], [place] [address]  Urinalysis, Routine w reflex microscopic     Status: None   Collection Time: 01/07/18  4:58 PM  Result Value Ref Range   Color, Urine YELLOW YELLOW   APPearance CLEAR CLEAR   Specific Gravity, Urine 1.015 1.005 - 1.030   pH 6.0 5.0 - 8.0   Glucose, UA NEGATIVE NEGATIVE mg/dL   Hgb urine dipstick NEGATIVE NEGATIVE   Bilirubin Urine NEGATIVE NEGATIVE   Ketones, ur NEGATIVE NEGATIVE mg/dL   Protein, ur NEGATIVE NEGATIVE mg/dL   Nitrite NEGATIVE NEGATIVE   Leukocytes, UA NEGATIVE NEGATIVE     Comment: Performed at [place] [address], [place], [place] [address]  CBC with Differential     Status: Abnormal   Collection Time: 01/07/18  4:58 PM  Result Value Ref Range   WBC 19.3 (H) 4.0 - 10.5 K/uL   RBC 1.96 (L) 4.22 - 5.81 MIL/uL   Hemoglobin 6.2 (LL) 13.0 - 17.0 g/dL    Comment: REPEATED TO VERIFY CRITICAL RESULT CALLED TO, READ BACK BY AND VERIFIED WITH: A DAVY RN 1756 01/07/18 A NAVARRO    HCT 18.7 (L) 39.0 - 52.0 %   MCV 95.4 78.0 - 100.0 fL   MCH 31.6 26.0 - 34.0 pg   MCHC 33.2 30.0 - 36.0 g/dL   RDW 20.9 (H) 11.5 - 15.5 %   Platelets 134 (L) 150 - 400 K/uL   Neutrophils Relative % 88 %   Lymphocytes Relative 6 %   Monocytes Relative 6 %   Eosinophils Relative 0 %   Basophils Relative 0 %   Neutro Abs 16.9 (H) 1.7 - 7.7 K/uL   Lymphs Abs 1.2 0.7 - 4.0 K/uL   Monocytes Absolute 1.2 (H) 0.1 - 1.0 K/uL   Eosinophils Absolute 0.0 0.0 - 0.7 K/uL   Basophils Absolute 0.0 0.0 - 0.1 K/uL   RBC Morphology RARE NRBCs    WBC Morphology INCREASED BANDS (>20% BANDS)     Comment: MILD LEFT SHIFT (1-5% METAS, OCC MYELO, OCC BANDS) Performed at [facility], [place] [address] [place], [place] [address]   Type and screen [place]     Status: None (Preliminary result)   Collection Time: 01/07/18  5:07 PM  Result Value Ref Range   ABO/RH(D) O POS    Antibody Screen NEG    Sample Expiration 01/10/2018    Unit Number [id number]    Blood Component Type RED CELLS,LR    Unit division 00    Status of Unit ALLOCATED    Transfusion Status OK TO TRANSFUSE    Crossmatch Result      Compatible Performed at [facility], [place] [address] [place], [place] [address]    Unit Number [id number]    Blood Component Type RED CELLS,LR    Unit division 00    Status of Unit ALLOCATED    Transfusion Status OK TO TRANSFUSE    Crossmatch Result Compatible   Prepare RBC     Status: None  Collection  Time: 01/07/18  5:07 PM  Result Value Ref Range   Order Confirmation      ORDER PROCESSED BY BLOOD BANK Performed at [place] [address] [place], [place] [address]   ABO/Rh     Status: None (Preliminary result)   Collection Time: 01/07/18  5:07 PM  Result Value Ref Range   ABO/RH(D)      Jenetta Downer POS Performed at [facility], [place] [address], [place], [place] [address]   POC occult blood, ED     Status: Abnormal   Collection Time: 01/07/18  5:17 PM  Result Value Ref Range   Fecal Occult Bld POSITIVE (A) NEGATIVE   No results found.  Pending Labs Unresulted Labs (From admission, onward)   Start     Ordered   01/08/18 0500  CBC  Tomorrow morning,   R     01/07/18 1835   01/08/18 0500  Comprehensive metabolic panel  Tomorrow morning,   R     01/07/18 1835      Vitals/Pain Today's Vitals   01/07/18 1505 01/07/18 1515 01/07/18 1600  BP:  127/64 129/69  Pulse:  90 89  Resp:  13 (!) 23  Temp:  (!) 97.3 F (36.3 C)   TempSrc:  Oral   SpO2:  99% 98%  PainSc: 0-No pain 0-No pain     Isolation Precautions No active isolations  Medications Medications  0.9 %  sodium chloride infusion (Manually program via Guardrails IV Fluids) (has no administration in time range)  vitamin E capsule 800 Units (has no administration in time range)  multivitamin with minerals tablet 1 tablet (has no administration in time range)  calcium-vitamin D (OSCAL WITH D) 500-200 MG-UNIT per tablet 1 tablet (has no administration in time range)  fish oil-omega-3 fatty acids capsule 1 g (has no administration in time range)  folic acid (FOLVITE) tablet 1 mg (has no administration in time range)  levothyroxine (SYNTHROID, LEVOTHROID) tablet 75 mcg (has no administration in time range)  Vitamin D (Ergocalciferol) (DRISDOL) capsule 50,000 Units (has no administration in time range)  leuprolide (LUPRON) injection 30 mg (has no administration in time range)  sodium  chloride flush (NS) 0.9 % injection 3 mL (has no administration in time range)  sodium chloride flush (NS) 0.9 % injection 3 mL (has no administration in time range)  0.9 %  sodium chloride infusion (has no administration in time range)  acetaminophen (TYLENOL) tablet 650 mg (has no administration in time range)    Or  acetaminophen (TYLENOL) suppository 650 mg (has no administration in time range)  traZODone (DESYREL) tablet 50 mg (has no administration in time range)  polyethylene glycol (MIRALAX / GLYCOLAX) packet 17 g (has no administration in time range)  ondansetron (ZOFRAN) tablet 4 mg (has no administration in time range)    Or  ondansetron (ZOFRAN) injection 4 mg (has no administration in time range)  albuterol (PROVENTIL) (2.5 MG/3ML) 0.083% nebulizer solution 2.5 mg (has no administration in time range)  pantoprazole (PROTONIX) EC tablet 40 mg (has no administration in time range)  iopamidol (ISOVUE-300) 61 % injection (has no administration in time range)  iopamidol (ISOVUE-300) 61 % injection 100 mL (has no administration in time range)  pantoprazole (PROTONIX) injection 40 mg (has no administration in time range)  sodium chloride 0.9 % bolus 1,000 mL (0 mLs Intravenous Stopped 01/07/18 1814)  pantoprazole (PROTONIX) injection 40 mg (40 mg Intravenous Given 01/07/18 1735)  Mobility walks with person assist

## 2018-01-07 NOTE — ED Triage Notes (Signed)
Patient from cancer center with Hgb of 5.9 and weak, dark stools less appetite. Patient presents as very pale and lethargic.

## 2018-01-07 NOTE — ED Provider Notes (Signed)
[place] DEPT Provider Note   CSN: [id number] Arrival date & time: 01/07/18  1457     History   Chief Complaint Chief Complaint  Patient presents with  . Abnormal Lab    HPI Lucas Holloway is a 77 y.o. male.  HPI   Lucas Holloway is a 77 y.o. male, with a history of metastatic prostate cancer, presenting to the ED with generalized weakness over last week. Accompanied by fatigue and loss of appetite. Endorses black stools for past week. Constipation with last BM two days ago, still passing gas. Weakness worse this morning, went to cancer center, Hgb found to be 5.9.  Patient has had 2 chemotherapy treatments with the last treatment occurring July 25.  He has been frequently using meloxicam and naproxen together for the past month. Denies fever, N/V/D, abdominal pain, chest pain, shortness of breath, syncope, or any other complaints.   Past Medical History:  Diagnosis Date  . Polyarticular psoriatic arthritis ([place])   . Prostate cancer [facility])     Patient Active Problem List   Diagnosis Date Noted  . Symptomatic Anemia 01/07/2018  . Goals of care, counseling/discussion 11/23/2017  . Malignant neoplasm metastatic to bone ([place]) 02/09/2016  . Prostate cancer ([place]) 01/14/2016  . Pituitary macroadenoma ([place]) 11/04/2015    Past Surgical History:  Procedure Laterality Date  . IR IMAGING GUIDED PORT INSERTION  11/28/2017  . radioactive seed implant prostate  2009        Home Medications    Prior to Admission medications   Medication Sig Start Date End Date Taking? Authorizing Provider  calcium-vitamin D (OSCAL WITH D) 500-200 MG-UNIT per tablet Take 1 tablet by mouth 2 (two) times daily.   Yes [provider]  fish oil-omega-3 fatty acids 1000 MG capsule Take 1 g by mouth daily.    Yes [provider]  folic acid (FOLVITE) 1 MG tablet Take 1 mg by mouth daily.   Yes [provider]  leuprolide (LUPRON) 30 MG injection  Inject 30 mg into the muscle every 4 (four) months.   Yes [provider]  levothyroxine (SYNTHROID, LEVOTHROID) 75 MCG tablet Take 75 mcg by mouth daily before breakfast.  10/01/15  Yes [provider]  lidocaine-prilocaine (EMLA) cream Apply 1 application topically as needed. 11/23/17  Yes Wyatt Portela, MD  meloxicam (MOBIC) 7.5 MG tablet Take 7.5 mg by mouth daily.   Yes [provider]  Multiple Vitamin (MULTIVITAMIN WITH MINERALS) TABS Take 1 tablet by mouth daily.   Yes [provider]  prochlorperazine (COMPAZINE) 10 MG tablet TAKE 1 TABLET(10 MG) BY MOUTH EVERY 6 HOURS AS NEEDED FOR NAUSEA OR VOMITING 02/12/17  Yes Shadad, Mathis Dad, MD  Vitamin D, Ergocalciferol, (DRISDOL) 50000 units CAPS capsule Take 5,000 Units by mouth daily.    Yes [provider]  vitamin E 400 UNIT capsule Take 800 Units by mouth daily.    Yes [provider]    Family History Family History  Problem Relation Age of Onset  . Cancer Sister        Ovarian    Social History Social History   Tobacco Use  . Smoking status: Former Research scientist (life sciences)  . Smokeless tobacco: Never Used  Substance Use Topics  . Alcohol use: No    Alcohol/week: 0.0 oz  . Drug use: No     Allergies   Patient has no known allergies.   Review of Systems Review of Systems  Constitutional: Positive for  appetite change and fatigue. Negative for fever.  Respiratory: Negative for cough and shortness of breath.   Cardiovascular: Negative for chest pain.  Gastrointestinal: Positive for constipation. Negative for abdominal pain, nausea and vomiting.       Dark stools  Genitourinary: Negative for dysuria and hematuria.  Neurological: Positive for weakness (generalized).  All other systems reviewed and are negative.    Physical Exam Updated Vital Signs BP 129/69   Pulse 89   Temp (!) 97.3 F (36.3 C) (Oral)   Resp (!) 23   SpO2 98%   Physical Exam  Constitutional: No distress.    Chronically ill-appearing  HENT:  Head: Normocephalic and atraumatic.  Eyes: Conjunctivae are normal.  Neck: Neck supple.  Cardiovascular: Normal rate, regular rhythm, normal heart sounds and intact distal pulses.  Pulmonary/Chest: Effort normal and breath sounds normal. No respiratory distress.  Abdominal: Soft. There is no tenderness. There is no guarding.  Genitourinary: Rectal exam shows guaiac positive stool.  Genitourinary Comments: No external hemorrhoids, fissures, or lesions noted. No gross blood, melena, or stool burden. No rectal tenderness. No foreign bodies noted.  PA student, Brenton Grills, served as chaperone during the rectal exam.  Musculoskeletal: He exhibits no edema.  Lymphadenopathy:    He has no cervical adenopathy.  Neurological: He is alert.  Generally weak  Skin: Skin is warm and dry. He is not diaphoretic. There is pallor.  Psychiatric: He has a normal mood and affect. His behavior is normal.  Nursing note and vitals reviewed.    ED Treatments / Results  Labs (all labs ordered are listed, but only abnormal results are displayed) Labs Reviewed  COMPREHENSIVE METABOLIC PANEL - Abnormal; Notable for the following components:      Result Value   Sodium 123 (*)    Chloride 91 (*)    Creatinine, Ser 0.50 (*)    Calcium 7.5 (*)    Total Protein 5.1 (*)    Albumin 2.5 (*)    Alkaline Phosphatase 158 (*)    All other components within normal limits  CBC WITH DIFFERENTIAL/PLATELET - Abnormal; Notable for the following components:   WBC 19.3 (*)    RBC 1.96 (*)    Hemoglobin 6.2 (*)    HCT 18.7 (*)    RDW 20.9 (*)    Platelets 134 (*)    Neutro Abs 16.9 (*)    Monocytes Absolute 1.2 (*)    All other components within normal limits  POC OCCULT BLOOD, ED - Abnormal; Notable for the following components:   Fecal Occult Bld POSITIVE (*)    All other components within normal limits  PROTIME-INR  URINALYSIS, ROUTINE W REFLEX MICROSCOPIC  CBC  COMPREHENSIVE  METABOLIC PANEL  I-STAT CG4 LACTIC ACID, ED  TYPE AND SCREEN  PREPARE RBC (CROSSMATCH)  ABO/RH  PREPARE RBC (CROSSMATCH)    EKG None  Radiology No results found.  Procedures .Critical Care Performed by: Lorayne Bender, PA-C Authorized by: Lorayne Bender, PA-C   Critical care provider statement:    Critical care time (minutes):  35   Critical care time was exclusive of:  Separately billable procedures and treating other patients   Critical care was necessary to treat or prevent imminent or life-threatening deterioration of the following conditions: Symptomatic anemia; GI bleed.   Critical care was time spent personally by me on the following activities:  Development of treatment plan with patient or surrogate, discussions with consultants, evaluation of patient's response to treatment, examination of patient,  obtaining history from patient or surrogate, re-evaluation of patient's condition, pulse oximetry, ordering and review of radiographic studies, ordering and review of laboratory studies and ordering and performing treatments and interventions   I assumed direction of critical care for this patient from another provider in my specialty: no     (including critical care time)  Medications Ordered in ED Medications  0.9 %  sodium chloride infusion (Manually program via Guardrails IV Fluids) (has no administration in time range)  vitamin E capsule 800 Units (has no administration in time range)  multivitamin with minerals tablet 1 tablet (has no administration in time range)  calcium-vitamin D (OSCAL WITH D) 500-200 MG-UNIT per tablet 1 tablet (has no administration in time range)  fish oil-omega-3 fatty acids capsule 1 g (has no administration in time range)  folic acid (FOLVITE) tablet 1 mg (has no administration in time range)  levothyroxine (SYNTHROID, LEVOTHROID) tablet 75 mcg (has no administration in time range)  Vitamin D (Ergocalciferol) (DRISDOL) capsule 50,000 Units (has no  administration in time range)  leuprolide (LUPRON) injection 30 mg (has no administration in time range)  sodium chloride flush (NS) 0.9 % injection 3 mL (has no administration in time range)  sodium chloride flush (NS) 0.9 % injection 3 mL (has no administration in time range)  acetaminophen (TYLENOL) tablet 650 mg (has no administration in time range)    Or  acetaminophen (TYLENOL) suppository 650 mg (has no administration in time range)  traZODone (DESYREL) tablet 50 mg (has no administration in time range)  polyethylene glycol (MIRALAX / GLYCOLAX) packet 17 g (has no administration in time range)  ondansetron (ZOFRAN) tablet 4 mg (has no administration in time range)    Or  ondansetron (ZOFRAN) injection 4 mg (has no administration in time range)  albuterol (PROVENTIL) (2.5 MG/3ML) 0.083% nebulizer solution 2.5 mg (has no administration in time range)  iopamidol (ISOVUE-300) 61 % injection (has no administration in time range)  pantoprazole (PROTONIX) injection 40 mg (has no administration in time range)  0.9 %  sodium chloride infusion (Manually program via Guardrails IV Fluids) (has no administration in time range)  furosemide (LASIX) injection 40 mg (has no administration in time range)  0.9 %  sodium chloride infusion (has no administration in time range)  morphine 2 MG/ML injection 2 mg (has no administration in time range)  oxyCODONE (Oxy IR/ROXICODONE) immediate release tablet 5 mg (has no administration in time range)  sodium chloride 0.9 % bolus 1,000 mL (0 mLs Intravenous Stopped 01/07/18 1814)  pantoprazole (PROTONIX) injection 40 mg (40 mg Intravenous Given 01/07/18 1735)  iopamidol (ISOVUE-300) 61 % injection 100 mL (100 mLs Intravenous Contrast Given 01/07/18 1848)     Initial Impression / Assessment and Plan / ED Course  I have reviewed the triage vital signs and the nursing notes.  Pertinent labs & imaging results that were available during my care of the patient were  reviewed by me and considered in my medical decision making (see chart for details).  Clinical Course as of Jan 07 1909  Mon Jan 08, 7935  3564 77 year old male with prostate cancer on active chemo sent here from the cancer clinic for increased fatigue and anemia.  Patient denies any obvious bleeding.  He states is never needed a transfusion before.  His last chemo was a couple weeks ago.  Denies any fever or chest pain or shortness of breath or fainting.  He did say he was having some dark stools.  On exam  he is tired appearing and pale although alert with a normal blood pressure and heart rate.  He is getting some repeat labs a stool for occult blood and getting consented for transfusion.  He will likely need to be admitted.   [MB]  77 Spoke with Dr. Denton Brick, hospitalist. Agrees to admit the patient.   [SJ]  8841 Spoke with Dr. Collene Mares, GI. States she will consult on the patient.   [SJ]    Clinical Course User Index [MB] Hayden Rasmussen, MD [SJ] Lorayne Bender, PA-C    Patient presents with weakness and fatigue.  Hemoglobin of 5.9 in the setting of dark stools.  Hemoccult positive.  Suspect possible upper GI bleed.  Hyponatremia and leukocytosis also noted.  Patient admitted with GI consult.  CT of the abdomen and pelvis pending at admission.  Findings and plan of care discussed with Ronnald Ramp, MD.   Vitals:   01/07/18 1515 01/07/18 1600  BP: 127/64 129/69  Pulse: 90 89  Resp: 13 (!) 23  Temp: (!) 97.3 F (36.3 C)   TempSrc: Oral   SpO2: 99% 98%     Final Clinical Impressions(s) / ED Diagnoses   Final diagnoses:  Symptomatic anemia  Hyponatremia    ED Discharge Orders    None       Layla Maw 01/07/18 1912    Hayden Rasmussen, MD 01/08/18 1946

## 2018-01-07 NOTE — Plan of Care (Signed)
Plan of care discussed.   

## 2018-01-07 NOTE — ED Notes (Signed)
UNABLE TO STAND PATIENT UP FOR ORTHOSTATICS DUE TO WEAKNESS

## 2018-01-07 NOTE — H&P (Signed)
Patient Demographics:    Lucas Holloway, is a 77 y.o. male  MRN: [id number]   DOB - [birth year]  Admit Date - 01/07/2018  Outpatient Primary MD for the patient is [name], MD   Assessment & Plan:    Principal Problem:   Symptomatic Anemia Active Problems:   Pituitary macroadenoma ([place])   Prostate cancer ([place])   Malignant neoplasm metastatic to bone [facility])    1)Symptomatic acute on chronic anemia--- baseline hemoglobin usually above 11, hemoglobin today around 5.9, , stool is Hemoccult positive and dark , suspect due to NSAID use (on Mobic and Aleve), patient is dizzy, weak, with palpitations when he tries to walk, give IV Protonix, EDP discussed this case with on-call GI, will  transfuse 2 units of packed cells, risk, benefits and alternatives to transfusion of blood products discussed. Indication for transfusion discussed. Consent obtained.  I personally discussed this case with Dr. Collene Mares face to face (on-call GI physician), Dr. Collene Mares plans to see patient shortly, patient may need endoluminal evaluation, last colonoscopy was about 10 years ago no recent EGD   2)Resistant metastatic prostate cancer with mets to the bone--patient sees Dr. [name], currently getting Taxotere chemotherapy started on December 04, 2017. S/p cycle 2 ,  He is receiving Xgeva every 8 weeks.  This is been on hold because of dental procedures. He is currently on Lupron at 30 mg every 4 months.  Last injection was given on December 04, 2017.  3)Leukocytosis-no obvious evidence of infection at this time white count is up to 19.3, ???  Reactive, CT abdomen and pelvis ordered by EDP is pending at the time of this dictation, patient has no fevers, no chills, get blood cultures, hold off on antibiotics at this time pending further clinical and lab data  4)  hyponatremia-this is chronic, patient's sodium is close to his baseline, which is in the mid 120s usually, avoid excessive free water intake.   With History of - Reviewed by me  Past Medical History:  Diagnosis Date  . Polyarticular psoriatic arthritis ([place])   . Prostate cancer [facility])       Past Surgical History:  Procedure Laterality Date  . IR IMAGING GUIDED PORT INSERTION  11/28/2017  . radioactive seed implant prostate  2009      Chief Complaint  Patient presents with  . Abnormal Lab      HPI:    Lucas Holloway  is a 77 y.o. male   with castration-resistant prostate cancer diagnosed in 2009, initially treated by Dr. [name] the urologist, prostate cancer reoccurred in October 2016, patient has documented bony metastasis, at iinitial diagnosis of a Gleason score 6, PSA of 9 in 2009, currently getting Lupron shots every 4 months....  History of chronic hyponatremia with sodium usually in the mid 120s as well as chronic anemia with hemoglobin usually above 11 patient presented to oncology office earlier on 01/07/2018 with complaints of generalized weakness debility  and orthostatic dizziness, apparently according to his wife patient has not been eating and drinking is been laying on the chair in the bed for the last couple of days, the wife finally convinced him to come into the oncology office on 01/07/2018, CBC done on 01/07/2018 shows a hemoglobin of 5.9 with a baseline previously above 11 just a month ago, INR is not elevated  Patient denies chest pain, he does have palpitations with attempts to ambulate, does have dizziness with attempt to ambulate, he does have some dyspnea on exertion at rest no shortness of breath no chest pain  On further questioning patient admits that he has been taking Mobic/meloxicam for over a year now on a daily basis however over the last month he started taking naproxen at least 4-5 times every week due to arthralgia.  His stools were dark today with heme positive  stool in the ED, however patient is not really having much bowel movement due to lack of oral intake.   Additional history obtained from patient's wife at bedside, medications reviewed,   Blood pressure and heart rate were stable at rest in the ED  CODE STATUS discussed, patient is a DNR according to patient and his wife   personally discussed this case with Dr. Collene Mares face to face (on-call GI physician), Dr. Collene Mares plans to see patient shortly, patient may need endoluminal evaluation, last colonoscopy was about 10 years ago no recent EGD    Review of systems:    In addition to the HPI above,   A full Review of  Systems was done, all other systems reviewed are negative except as noted above in HPI , .    Social History:  Reviewed by me    Social History   Tobacco Use  . Smoking status: Former Research scientist (life sciences)  . Smokeless tobacco: Never Used  Substance Use Topics  . Alcohol use: No    Alcohol/week: 0.0 oz     Family History :  Reviewed by me    Family History  Problem Relation Age of Onset  . Cancer Sister        Ovarian    Home Medications:   Prior to Admission medications   Medication Sig Start Date End Date Taking? Authorizing Provider  calcium-vitamin D (OSCAL WITH D) 500-200 MG-UNIT per tablet Take 1 tablet by mouth 2 (two) times daily.   Yes [provider]  fish oil-omega-3 fatty acids 1000 MG capsule Take 1 g by mouth daily.    Yes [provider]  folic acid (FOLVITE) 1 MG tablet Take 1 mg by mouth daily.   Yes [provider]  leuprolide (LUPRON) 30 MG injection Inject 30 mg into the muscle every 4 (four) months.   Yes [provider]  levothyroxine (SYNTHROID, LEVOTHROID) 75 MCG tablet Take 75 mcg by mouth daily before breakfast.  10/01/15  Yes [provider]  lidocaine-prilocaine (EMLA) cream Apply 1 application topically as needed. 11/23/17  Yes Wyatt Portela, MD  meloxicam (MOBIC) 7.5 MG tablet Take 7.5 mg by mouth  daily.   Yes [provider]  Multiple Vitamin (MULTIVITAMIN WITH MINERALS) TABS Take 1 tablet by mouth daily.   Yes [provider]  prochlorperazine (COMPAZINE) 10 MG tablet TAKE 1 TABLET(10 MG) BY MOUTH EVERY 6 HOURS AS NEEDED FOR NAUSEA OR VOMITING 02/12/17  Yes Shadad, Mathis Dad, MD  Vitamin D, Ergocalciferol, (DRISDOL) 50000 units CAPS capsule Take 5,000 Units by mouth daily.    Yes [provider]  vitamin E 400 UNIT capsule Take 800 Units by mouth daily.    Yes [provider]     Allergies:    No Known Allergies   Physical Exam:   Vitals  Blood pressure 129/69, pulse 89, temperature (!) 97.3 F (36.3 C), temperature source Oral, resp. rate (!) 23, SpO2 98 %.  Physical Examination: General appearance - alert, well appearing, and in no distress and  Mental status - alert, oriented to person, place, and time,  Eyes - sclera anicteric, pale conjunctiva Neck - supple, no JVD elevation , Chest - clear  to auscultation bilaterally, symmetrical air movement, Heart - S1 and S2 normal, regular, subclavian area with Port-A-Cath in situ Abdomen - soft, nontender, nondistended, no masses or organomegaly Neurological - screening mental status exam normal, neck supple without rigidity, cranial nerves II through XII intact, DTR's normal and symmetric Extremities -trace pedal edema noted, negative Homans, intact peripheral pulses  Skin -very pale    Data Review:    CBC Recent Labs  Lab 01/07/18 1404 01/07/18 1658  WBC 19.3* 19.3*  HGB 5.9* 6.2*  HCT 18.2* 18.7*  PLT 119* 134*  MCV 94.3 95.4  MCH 30.6 31.6  MCHC 32.4 33.2  RDW 20.8* 20.9*  LYMPHSABS 1.3 1.2  MONOABS 1.6* 1.2*  EOSABS 0.0 0.0  BASOSABS 0.1 0.0   ------------------------------------------------------------------------------------------------------------------  Chemistries  Recent Labs  Lab 01/07/18 1404 01/07/18 1658  NA 121* 123*  K 4.8 4.8  CL 90* 91*  CO2 22 24    GLUCOSE 94 93  BUN 13 17  CREATININE 0.61 0.50*  CALCIUM 7.6* 7.5*  AST 29 29  ALT 11 13  ALKPHOS 174* 158*  BILITOT 0.3 0.5   ------------------------------------------------------------------------------------------------------------------ estimated creatinine clearance is 74.3 mL/min (A) (by C-G formula based on SCr of 0.5 mg/dL (L)). ------------------------------------------------------------------------------------------------------------------ No results for input(s): TSH, T4TOTAL, T3FREE, THYROIDAB in the last 72 hours.  Invalid input(s): FREET3   Coagulation profile Recent Labs  Lab 01/07/18 1658  INR 1.04   ------------------------------------------------------------------------------------------------------------------- No results for input(s): DDIMER in the last 72 hours. -------------------------------------------------------------------------------------------------------------------  Cardiac Enzymes No results for input(s): CKMB, TROPONINI, MYOGLOBIN in the last 168 hours.  Invalid input(s): CK ------------------------------------------------------------------------------------------------------------------ No results found for: BNP   ---------------------------------------------------------------------------------------------------------------  Urinalysis    Component Value Date/Time   COLORURINE YELLOW 01/07/2018 [place] 01/07/2018 1658   LABSPEC 1.015 01/07/2018 1658   PHURINE 6.0 01/07/2018 1658   GLUCOSEU NEGATIVE 01/07/2018 1658   HGBUR NEGATIVE 01/07/2018 1658   BILIRUBINUR NEGATIVE 01/07/2018 1658   KETONESUR NEGATIVE 01/07/2018 1658   PROTEINUR NEGATIVE 01/07/2018 1658   UROBILINOGEN 0.2 02/14/2012 2102   NITRITE NEGATIVE 01/07/2018 1658   LEUKOCYTESUR NEGATIVE 01/07/2018 1658    ----------------------------------------------------------------------------------------------------------------   Imaging Results:    No  results found.  Radiological Exams on Admission: No results found.  DVT Prophylaxis -SCD  /TEDS  AM Labs Ordered, also please review Full Orders  Family Communication: Admission, patients condition and plan of care including tests being ordered have been discussed with the patient and wife  who indicate understanding and agree with the plan   Code Status - Full Code  Likely DC to  home  Condition   stable  Roxan Hockey M.D on 01/07/2018 at 6:43 PM  Go to [email or web address] - [email or web address] for contact info  [facility] - Office  [phone]

## 2018-01-07 NOTE — Consult Note (Signed)
Reason for Consult: Melenic stools with anemia. Referring Physician: THP  Lucas Holloway is an 77 y.o. male.  HPI: 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 He was started on Taxotere and treatment under care of Dr. [name] on 12/04/2017. He is also receiving Delton See which is present on hold due to pending per dental procedure. He came to the emergency room for further workup and was found to have guaiac-positive stools with anemia; is hemoglobin is 5.9 g/dL and was admitted for aggressive resuscitation and further workup. patient denies having any abdominal pain nausea vomiting or bright red bleeding per rectum. He has been having some palpitations and tires easily. He has been on Mobic for over a year to help with his joint pains nd for the last month he's been taking Aleve 4-5 times a week in addition to the Mobic. Patient is to receive 2 units of packed red blood cells tonight. A CT scan has already been done to evaluate his leukocytosis which may be reactive. His last colonoscopy was done in 2000 712 hyperplastic polyps were removed.  .   Past Medical History:  Diagnosis Date  . Polyarticular psoriatic arthritis ([place])   . Prostate cancer [facility])    Past Surgical History:  Procedure Laterality Date  . IR IMAGING GUIDED PORT INSERTION  11/28/2017  . radioactive seed implant prostate  2009   Family History  Problem Relation Age of Onset  . Cancer Sister        Ovarian   Social History:  reports that he has quit smoking. He has never used smokeless tobacco. He reports that he does not drink alcohol or use drugs.  Allergies: No Known Allergies  Medications: I have reviewed the patient's current  medications.  Results for orders placed or performed during the hospital encounter of 01/07/18 (from the past 48 hour(s))  Comprehensive metabolic panel     Status: Abnormal   Collection Time: 01/07/18  4:58 PM  Result Value Ref Range   Sodium 123 (L) 135 - 145 mmol/L   Potassium 4.8 3.5 - 5.1 mmol/L   Chloride 91 (L) 98 - 111 mmol/L   CO2 24 22 - 32 mmol/L   Glucose, Bld 93 70 - 99 mg/dL   BUN 17 8 - 23 mg/dL   Creatinine, Ser 0.50 (L) 0.61 - 1.24 mg/dL   Calcium 7.5 (L) 8.9 - 10.3 mg/dL   Total Protein 5.1 (L) 6.5 - 8.1 g/dL   Albumin 2.5 (L) 3.5 - 5.0 g/dL   AST 29 15 - 41 U/L   ALT 13 0 - 44 U/L   Alkaline Phosphatase 158 (H) 38 - 126 U/L   Total Bilirubin 0.5 0.3 - 1.2 mg/dL   GFR calc non Af Amer >60 >60 mL/min   GFR calc Af Amer >60 >60 mL/min    Comment: (NOTE) The eGFR has been calculated using the CKD EPI equation. This calculation has not been validated in all clinical situations. eGFR's persistently <60 mL/min signify possible Chronic Kidney Disease.    Anion gap 8 5 - 15    Comment: Performed at [facility], [place] [address] [place], [place] [address]  Protime-INR  Status: None   Collection Time: 01/07/18  4:58 PM  Result Value Ref Range   Prothrombin Time 13.5 11.4 - 15.2 seconds   INR 1.04     Comment: Performed at [facility], [place] [address] [place], [place] [address]  Urinalysis, Routine w reflex microscopic     Status: None   Collection Time: 01/07/18  4:58 PM  Result Value Ref Range   Color, Urine YELLOW YELLOW   APPearance CLEAR CLEAR   Specific Gravity, Urine 1.015 1.005 - 1.030   pH 6.0 5.0 - 8.0   Glucose, UA NEGATIVE NEGATIVE mg/dL   Hgb urine dipstick NEGATIVE NEGATIVE   Bilirubin Urine NEGATIVE NEGATIVE   Ketones, ur NEGATIVE NEGATIVE mg/dL   Protein, ur NEGATIVE NEGATIVE mg/dL   Nitrite NEGATIVE NEGATIVE   Leukocytes, UA NEGATIVE NEGATIVE    Comment: Performed at [place] [address], [place], [place] [address]  CBC with Differential     Status: Abnormal   Collection Time: 01/07/18  4:58 PM  Result Value Ref Range   WBC 19.3 (H) 4.0 - 10.5 K/uL   RBC 1.96 (L) 4.22 - 5.81 MIL/uL   Hemoglobin 6.2 (LL) 13.0 - 17.0 g/dL    Comment: REPEATED TO VERIFY CRITICAL RESULT CALLED TO, READ BACK BY AND VERIFIED WITH: A DAVY RN 1756 01/07/18 A NAVARRO    HCT 18.7 (L) 39.0 - 52.0 %   MCV 95.4 78.0 - 100.0 fL   MCH 31.6 26.0 - 34.0 pg   MCHC 33.2 30.0 - 36.0 g/dL   RDW 20.9 (H) 11.5 - 15.5 %   Platelets 134 (L) 150 - 400 K/uL   Neutrophils Relative % 88 %   Lymphocytes Relative 6 %   Monocytes Relative 6 %   Eosinophils Relative 0 %   Basophils Relative 0 %   Neutro Abs 16.9 (H) 1.7 - 7.7 K/uL   Lymphs Abs 1.2 0.7 - 4.0 K/uL   Monocytes Absolute 1.2 (H) 0.1 - 1.0 K/uL   Eosinophils Absolute 0.0 0.0 - 0.7 K/uL   Basophils Absolute 0.0 0.0 - 0.1 K/uL   RBC Morphology RARE NRBCs    WBC Morphology INCREASED BANDS (>20% BANDS)     Comment: MILD LEFT SHIFT (1-5% METAS, OCC MYELO, OCC BANDS) Performed at [facility], [place] [address], [place], [place] [address]   Type and screen [place]     Status: None (Preliminary result)   Collection Time: 01/07/18  5:07 PM  Result Value Ref Range   ABO/RH(D) O POS    Antibody Screen NEG    Sample Expiration 01/10/2018    Unit Number [id number]    Blood Component Type RED CELLS,LR    Unit division 00    Status of Unit ALLOCATED    Transfusion Status OK TO TRANSFUSE    Crossmatch Result      Compatible Performed at [facility], [place] [address], [place], [place] [address]    Unit Number [id number]    Blood Component Type RED CELLS,LR    Unit division 00    Status of Unit ALLOCATED    Transfusion Status OK TO TRANSFUSE    Crossmatch Result Compatible   Prepare RBC     Status: None   Collection Time: 01/07/18  5:07 PM  Result Value Ref Range   Order  Confirmation      ORDER PROCESSED BY BLOOD BANK Performed at [facility], [place]  [address], [place], [place] [address]   ABO/Rh     Status: None (Preliminary result)   Collection Time: 01/07/18  5:07 PM  Result Value Ref Range   ABO/RH(D)      Jenetta Downer POS Performed at [facility], [place] [address] [place], [place] [address]   POC occult blood, ED     Status: Abnormal   Collection Time: 01/07/18  5:17 PM  Result Value Ref Range   Fecal Occult Bld POSITIVE (A) NEGATIVE  I-Stat CG4 Lactic Acid, ED     Status: None   Collection Time: 01/07/18  6:43 PM  Result Value Ref Range   Lactic Acid, Venous 1.49 0.5 - 1.9 mmol/L   Review of Systems  Constitutional: Positive for malaise/fatigue. Negative for chills, diaphoresis, fever and weight loss.  HENT: Negative.   Eyes: Negative.   Respiratory: Negative.   Cardiovascular: Negative.   Gastrointestinal: Positive for blood in stool, constipation and melena. Negative for abdominal pain, diarrhea, heartburn, nausea and vomiting.  Musculoskeletal: Positive for back pain and joint pain.  Skin: Negative.   Neurological: Positive for dizziness and weakness. Negative for tingling, tremors, sensory change, speech change, seizures, loss of consciousness and headaches.  Endo/Heme/Allergies: Negative.    Blood pressure 129/69, pulse 89, temperature (!) 97.3 F (36.3 C), temperature source Oral, resp. rate (!) 23, SpO2 98 %. Physical Exam  Constitutional: He is oriented to person, place, and time. He appears well-developed and well-nourished.  very pale appearing, fatigued elderly white male  HENT:  Head: Normocephalic and atraumatic.  Eyes: Pupils are equal, round, and reactive to light. Conjunctivae and EOM are normal.  Neck: Normal range of motion. Neck supple.  Cardiovascular: Normal rate and regular rhythm.  Respiratory: Effort normal and breath sounds normal.  GI: Soft. Bowel sounds are normal.  Neurological:  He is alert and oriented to person, place, and time.  Skin: Skin is warm and dry.  Psychiatric: He has a normal mood and affect. His behavior is normal. Judgment and thought content normal.   Assessment/Plan: 1) Severe anemia with guaiac positive stools and melena-will plan to do a EGD once he has been has received the blood transfusions. I suspect the patient has a nonsteroidal induced ulcer. IV Protonix has been started in the ER. 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 [name]. 3)  Leukocytosis-possibly reactiveCT scan of the abdomen and pelvis has been done and results are pending.  4) Chronic hyponatremia. 5) Pituitary macroadenoma. Sheylin Scharnhorst 01/07/2018, 6:51 PM

## 2018-01-07 NOTE — ED Notes (Signed)
Patient getting CT before transport to the floor.

## 2018-01-07 NOTE — Patient Instructions (Signed)
Anemia Anemia is a condition in which you do not have enough red blood cells or hemoglobin. Hemoglobin is a substance in red blood cells that carries oxygen. When you do not have enough red blood cells or hemoglobin (are anemic), your body cannot get enough oxygen and your organs may not work properly. As a result, you may feel very tired or have other problems. What are the causes? Common causes of anemia include:  Excessive bleeding. Anemia can be caused by excessive bleeding inside or outside the body, including bleeding from the intestine or from periods in women.  Poor nutrition.  Long-lasting (chronic) kidney, thyroid, and liver disease.  Bone marrow disorders.  Cancer and treatments for cancer.  HIV (human immunodeficiency virus) and AIDS (acquired immunodeficiency syndrome).  Treatments for HIV and AIDS.  Spleen problems.  Blood disorders.  Infections, medicines, and autoimmune disorders that destroy red blood cells.  What are the signs or symptoms? Symptoms of this condition include:  Minor weakness.  Dizziness.  Headache.  Feeling heartbeats that are irregular or faster than normal (palpitations).  Shortness of breath, especially with exercise.  Paleness.  Cold sensitivity.  Indigestion.  Nausea.  Difficulty sleeping.  Difficulty concentrating.  Symptoms may occur suddenly or develop slowly. If your anemia is mild, you may not have symptoms. How is this diagnosed? This condition is diagnosed based on:  Blood tests.  Your medical history.  A physical exam.  Bone marrow biopsy.  Your health care provider may also check your stool (feces) for blood and may do additional testing to look for the cause of your bleeding. You may also have other tests, including:  Imaging tests, such as a CT scan or MRI.  Endoscopy.  Colonoscopy.  How is this treated? Treatment for this condition depends on the cause. If you continue to lose a lot of blood,  you may need to be treated at a hospital. Treatment may include:  Taking supplements of iron, vitamin B12, or folic acid.  Taking a hormone medicine (erythropoietin) that can help to stimulate red blood cell growth.  Having a blood transfusion. This may be needed if you lose a lot of blood.  Making changes to your diet.  Having surgery to remove your spleen.  Follow these instructions at home:  Take over-the-counter and prescription medicines only as told by your health care provider.  Take supplements only as told by your health care provider.  Follow any diet instructions that you were given.  Keep all follow-up visits as told by your health care provider. This is important. Contact a health care provider if:  You develop new bleeding anywhere in the body. Get help right away if:  You are very weak.  You are short of breath.  You have pain in your abdomen or chest.  You are dizzy or feel faint.  You have trouble concentrating.  You have bloody or black, tarry stools.  You vomit repeatedly or you vomit up blood. Summary  Anemia is a condition in which you do not have enough red blood cells or enough of a substance in your red blood cells that carries oxygen (hemoglobin).  Symptoms may occur suddenly or develop slowly.  If your anemia is mild, you may not have symptoms.  This condition is diagnosed with blood tests as well as a medical history and physical exam. Other tests may be needed.  Treatment for this condition depends on the cause of the anemia. This information is not intended to replace advice   given to you by your health care provider. Make sure you discuss any questions you have with your health care provider. Document Released: 06/29/2004 Document Revised: 06/23/2016 Document Reviewed: 06/23/2016 Elsevier Interactive Patient Education  [employer].

## 2018-01-07 NOTE — Progress Notes (Signed)
Pt presents with extreme fatigue & dizziness/weakness.  A&Ox4.  Reports darkened stools x1 month.  HGB 5.9.  PA Lucianne Lei calling report to ED Agricultural consultant.  To transport via wheelchair.

## 2018-01-07 NOTE — Progress Notes (Signed)
Patients wife called, very concerned about current decline of patient.  She states he hasn't gotten off the couch in the past two days except to use the bathroom.  She states he is fearful to walk with his walker as he states his knees feel like they might give out and he is fearful he might fall.  She states he isn't eating very well.  Denies having any shortness of breath or chest pain.  His most recent hemoglobin was low but did not warrant a transfusion.  Discussed with the patients wife that the change per Dr Hazeline Junker last note verses the picture she is giving of her husband warrants a [facility] visit or a visit to the ED.  She states she doesn't know if she can get him in the car and get him to the appointment assuming he will agree to come.  She will discuss with him and call back to schedule if he agrees to be seen by [facility].

## 2018-01-07 NOTE — Telephone Encounter (Signed)
Received call from pt's wife stating that pt is willing to be seen in symptom management today & can be here @ 2:15 pm.  Informed to come in & will need labs first.  Symptom Management notified. Message to schedulers.

## 2018-01-07 NOTE — H&P (View-Only) (Signed)
Reason for Consult: Melenic stools with anemia. Referring Physician: THP  Lucas Holloway is an 77 y.o. male.  HPI: Lucas Holloway is a 77 year old white male with history of resistant metastatic prostate cancer with metastases to the bone received to him doses of chemotherapy the last dose being on 12/27/2017, was in his usual state of health till about a week ago when he noticed black stools and felt weak and dizzy and was not able to do much for himself. He was started on Taxotere and treatment under care of Dr. [name] on 12/04/2017. He is also receiving Delton See which is present on hold due to pending per dental procedure. He came to the emergency room for further workup and was found to have guaiac-positive stools with anemia; is hemoglobin is 5.9 g/dL and was admitted for aggressive resuscitation and further workup. patient denies having any abdominal pain nausea vomiting or bright red bleeding per rectum. He has been having some palpitations and tires easily. He has been on Mobic for over a year to help with his joint pains nd for the last month he's been taking Aleve 4-5 times a week in addition to the Mobic. Patient is to receive 2 units of packed red blood cells tonight. A CT scan has already been done to evaluate his leukocytosis which may be reactive. His last colonoscopy was done in 2000 712 hyperplastic polyps were removed.  .   Past Medical History:  Diagnosis Date  . Polyarticular psoriatic arthritis ([place])   . Prostate cancer [facility])    Past Surgical History:  Procedure Laterality Date  . IR IMAGING GUIDED PORT INSERTION  11/28/2017  . radioactive seed implant prostate  2009   Family History  Problem Relation Age of Onset  . Cancer Sister        Ovarian   Social History:  reports that he has quit smoking. He has never used smokeless tobacco. He reports that he does not drink alcohol or use drugs.  Allergies: No Known Allergies  Medications: I have reviewed the patient's current  medications.  Results for orders placed or performed during the hospital encounter of 01/07/18 (from the past 48 hour(s))  Comprehensive metabolic panel     Status: Abnormal   Collection Time: 01/07/18  4:58 PM  Result Value Ref Range   Sodium 123 (L) 135 - 145 mmol/L   Potassium 4.8 3.5 - 5.1 mmol/L   Chloride 91 (L) 98 - 111 mmol/L   CO2 24 22 - 32 mmol/L   Glucose, Bld 93 70 - 99 mg/dL   BUN 17 8 - 23 mg/dL   Creatinine, Ser 0.50 (L) 0.61 - 1.24 mg/dL   Calcium 7.5 (L) 8.9 - 10.3 mg/dL   Total Protein 5.1 (L) 6.5 - 8.1 g/dL   Albumin 2.5 (L) 3.5 - 5.0 g/dL   AST 29 15 - 41 U/L   ALT 13 0 - 44 U/L   Alkaline Phosphatase 158 (H) 38 - 126 U/L   Total Bilirubin 0.5 0.3 - 1.2 mg/dL   GFR calc non Af Amer >60 >60 mL/min   GFR calc Af Amer >60 >60 mL/min    Comment: (NOTE) The eGFR has been calculated using the CKD EPI equation. This calculation has not been validated in all clinical situations. eGFR's persistently <60 mL/min signify possible Chronic Kidney Disease.    Anion gap 8 5 - 15    Comment: Performed at [facility], [place] [address], [place], [place] [address]  Protime-INR  Status: None   Collection Time: 01/07/18  4:58 PM  Result Value Ref Range   Prothrombin Time 13.5 11.4 - 15.2 seconds   INR 1.04     Comment: Performed at [facility], [place] [address], [place], [place] [address]  Urinalysis, Routine w reflex microscopic     Status: None   Collection Time: 01/07/18  4:58 PM  Result Value Ref Range   Color, Urine YELLOW YELLOW   APPearance CLEAR CLEAR   Specific Gravity, Urine 1.015 1.005 - 1.030   pH 6.0 5.0 - 8.0   Glucose, UA NEGATIVE NEGATIVE mg/dL   Hgb urine dipstick NEGATIVE NEGATIVE   Bilirubin Urine NEGATIVE NEGATIVE   Ketones, ur NEGATIVE NEGATIVE mg/dL   Protein, ur NEGATIVE NEGATIVE mg/dL   Nitrite NEGATIVE NEGATIVE   Leukocytes, UA NEGATIVE NEGATIVE    Comment: Performed at [place] [address], [place], [place] [address]  CBC with Differential     Status: Abnormal   Collection Time: 01/07/18  4:58 PM  Result Value Ref Range   WBC 19.3 (H) 4.0 - 10.5 K/uL   RBC 1.96 (L) 4.22 - 5.81 MIL/uL   Hemoglobin 6.2 (LL) 13.0 - 17.0 g/dL    Comment: REPEATED TO VERIFY CRITICAL RESULT CALLED TO, READ BACK BY AND VERIFIED WITH: A DAVY RN 1756 01/07/18 A NAVARRO    HCT 18.7 (L) 39.0 - 52.0 %   MCV 95.4 78.0 - 100.0 fL   MCH 31.6 26.0 - 34.0 pg   MCHC 33.2 30.0 - 36.0 g/dL   RDW 20.9 (H) 11.5 - 15.5 %   Platelets 134 (L) 150 - 400 K/uL   Neutrophils Relative % 88 %   Lymphocytes Relative 6 %   Monocytes Relative 6 %   Eosinophils Relative 0 %   Basophils Relative 0 %   Neutro Abs 16.9 (H) 1.7 - 7.7 K/uL   Lymphs Abs 1.2 0.7 - 4.0 K/uL   Monocytes Absolute 1.2 (H) 0.1 - 1.0 K/uL   Eosinophils Absolute 0.0 0.0 - 0.7 K/uL   Basophils Absolute 0.0 0.0 - 0.1 K/uL   RBC Morphology RARE NRBCs    WBC Morphology INCREASED BANDS (>20% BANDS)     Comment: MILD LEFT SHIFT (1-5% METAS, OCC MYELO, OCC BANDS) Performed at [facility], [place] [address] [place], [place] [address]   Type and screen [place]     Status: None (Preliminary result)   Collection Time: 01/07/18  5:07 PM  Result Value Ref Range   ABO/RH(D) O POS    Antibody Screen NEG    Sample Expiration 01/10/2018    Unit Number [id number]    Blood Component Type RED CELLS,LR    Unit division 00    Status of Unit ALLOCATED    Transfusion Status OK TO TRANSFUSE    Crossmatch Result      Compatible Performed at [facility], [place] [address], [place], [place] [address]    Unit Number [id number]    Blood Component Type RED CELLS,LR    Unit division 00    Status of Unit ALLOCATED    Transfusion Status OK TO TRANSFUSE    Crossmatch Result Compatible   Prepare RBC     Status: None   Collection Time: 01/07/18  5:07 PM  Result Value Ref Range   Order  Confirmation      ORDER PROCESSED BY BLOOD BANK Performed at [facility], [place]  [address], [place], [place] [address]   ABO/Rh     Status: None (Preliminary result)   Collection Time: 01/07/18  5:07 PM  Result Value Ref Range   ABO/RH(D)      Jenetta Downer POS Performed at [facility], [place] [address] [place],  [address]   POC occult blood, ED     Status: Abnormal   Collection Time: 01/07/18  5:17 PM  Result Value Ref Range   Fecal Occult Bld POSITIVE (A) NEGATIVE  I-Stat CG4 Lactic Acid, ED     Status: None   Collection Time: 01/07/18  6:43 PM  Result Value Ref Range   Lactic Acid, Venous 1.49 0.5 - 1.9 mmol/L   Review of Systems  Constitutional: Positive for malaise/fatigue. Negative for chills, diaphoresis, fever and weight loss.  HENT: Negative.   Eyes: Negative.   Respiratory: Negative.   Cardiovascular: Negative.   Gastrointestinal: Positive for blood in stool, constipation and melena. Negative for abdominal pain, diarrhea, heartburn, nausea and vomiting.  Musculoskeletal: Positive for back pain and joint pain.  Skin: Negative.   Neurological: Positive for dizziness and weakness. Negative for tingling, tremors, sensory change, speech change, seizures, loss of consciousness and headaches.  Endo/Heme/Allergies: Negative.    Blood pressure 129/69, pulse 89, temperature (!) 97.3 F (36.3 C), temperature source Oral, resp. rate (!) 23, SpO2 98 %. Physical Exam  Constitutional: He is oriented to person, place, and time. He appears well-developed and well-nourished.  very pale appearing, fatigued elderly white male  HENT:  Head: Normocephalic and atraumatic.  Eyes: Pupils are equal, round, and reactive to light. Conjunctivae and EOM are normal.  Neck: Normal range of motion. Neck supple.  Cardiovascular: Normal rate and regular rhythm.  Respiratory: Effort normal and breath sounds normal.  GI: Soft. Bowel sounds are normal.  Neurological:  He is alert and oriented to person, place, and time.  Skin: Skin is warm and dry.  Psychiatric: He has a normal mood and affect. His behavior is normal. Judgment and thought content normal.   Assessment/Plan: 1) Severe anemia with guaiac positive stools and melena-will plan to do a EGD once he has been has received the blood transfusions. I suspect the patient has a nonsteroidal induced ulcer. IV Protonix has been started in the ER. 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 [name]. 3)  Leukocytosis-possibly reactiveCT scan of the abdomen and pelvis has been done and results are pending.  4) Chronic hyponatremia. 5) Pituitary macroadenoma. Lucas Holloway 01/07/2018, 6:51 PM

## 2018-01-08 ENCOUNTER — Observation Stay (HOSPITAL_COMMUNITY): Payer: Medicare Other | Admitting: Anesthesiology

## 2018-01-08 ENCOUNTER — Encounter (HOSPITAL_COMMUNITY): Payer: Self-pay

## 2018-01-08 ENCOUNTER — Encounter (HOSPITAL_COMMUNITY)
Admission: EM | Disposition: A | Discharge status: Home Health | Payer: Self-pay | Source: Home / Self Care | Attending: Family Medicine

## 2018-01-08 DIAGNOSIS — R195 Other fecal abnormalities: Secondary | ICD-10-CM | POA: Diagnosis not present

## 2018-01-08 DIAGNOSIS — D72829 Elevated white blood cell count, unspecified: Secondary | ICD-10-CM | POA: Diagnosis present

## 2018-01-08 DIAGNOSIS — D509 Iron deficiency anemia, unspecified: Secondary | ICD-10-CM | POA: Diagnosis present

## 2018-01-08 DIAGNOSIS — K210 Gastro-esophageal reflux disease with esophagitis: Secondary | ICD-10-CM | POA: Diagnosis present

## 2018-01-08 DIAGNOSIS — T39395A Adverse effect of other nonsteroidal anti-inflammatory drugs [NSAID], initial encounter: Secondary | ICD-10-CM | POA: Diagnosis present

## 2018-01-08 DIAGNOSIS — Z79899 Other long term (current) drug therapy: Secondary | ICD-10-CM | POA: Diagnosis not present

## 2018-01-08 DIAGNOSIS — Z9221 Personal history of antineoplastic chemotherapy: Secondary | ICD-10-CM | POA: Diagnosis not present

## 2018-01-08 DIAGNOSIS — K921 Melena: Secondary | ICD-10-CM | POA: Diagnosis present

## 2018-01-08 DIAGNOSIS — C61 Malignant neoplasm of prostate: Secondary | ICD-10-CM | POA: Diagnosis present

## 2018-01-08 DIAGNOSIS — Z87891 Personal history of nicotine dependence: Secondary | ICD-10-CM | POA: Diagnosis not present

## 2018-01-08 DIAGNOSIS — Z79890 Hormone replacement therapy (postmenopausal): Secondary | ICD-10-CM | POA: Diagnosis not present

## 2018-01-08 DIAGNOSIS — K208 Other esophagitis: Secondary | ICD-10-CM | POA: Diagnosis not present

## 2018-01-08 DIAGNOSIS — D649 Anemia, unspecified: Secondary | ICD-10-CM | POA: Diagnosis not present

## 2018-01-08 DIAGNOSIS — L4050 Arthropathic psoriasis, unspecified: Secondary | ICD-10-CM | POA: Diagnosis present

## 2018-01-08 DIAGNOSIS — K449 Diaphragmatic hernia without obstruction or gangrene: Secondary | ICD-10-CM | POA: Diagnosis present

## 2018-01-08 DIAGNOSIS — E871 Hypo-osmolality and hyponatremia: Secondary | ICD-10-CM | POA: Diagnosis present

## 2018-01-08 DIAGNOSIS — Z66 Do not resuscitate: Secondary | ICD-10-CM | POA: Diagnosis present

## 2018-01-08 DIAGNOSIS — Z809 Family history of malignant neoplasm, unspecified: Secondary | ICD-10-CM | POA: Diagnosis not present

## 2018-01-08 DIAGNOSIS — D352 Benign neoplasm of pituitary gland: Secondary | ICD-10-CM | POA: Diagnosis present

## 2018-01-08 DIAGNOSIS — K5900 Constipation, unspecified: Secondary | ICD-10-CM | POA: Diagnosis not present

## 2018-01-08 DIAGNOSIS — C7951 Secondary malignant neoplasm of bone: Secondary | ICD-10-CM | POA: Diagnosis present

## 2018-01-08 DIAGNOSIS — D638 Anemia in other chronic diseases classified elsewhere: Secondary | ICD-10-CM | POA: Diagnosis present

## 2018-01-08 HISTORY — PX: ESOPHAGOGASTRODUODENOSCOPY: SHX5428

## 2018-01-08 LAB — COMPREHENSIVE METABOLIC PANEL
ALT: 11 U/L (ref 0–44)
ANION GAP: 8 (ref 5–15)
AST: 31 U/L (ref 15–41)
Albumin: 2.4 g/dL — ABNORMAL LOW (ref 3.5–5.0)
Alkaline Phosphatase: 141 U/L — ABNORMAL HIGH (ref 38–126)
BILIRUBIN TOTAL: 0.6 mg/dL (ref 0.3–1.2)
BUN: 11 mg/dL (ref 8–23)
CHLORIDE: 95 mmol/L — AB (ref 98–111)
CO2: 23 mmol/L (ref 22–32)
Calcium: 7.1 mg/dL — ABNORMAL LOW (ref 8.9–10.3)
Creatinine, Ser: 0.53 mg/dL — ABNORMAL LOW (ref 0.61–1.24)
GFR calc Af Amer: >60 mL/min (ref >60)
Glucose, Bld: 96 mg/dL (ref 70–99)
POTASSIUM: 4.1 mmol/L (ref 3.5–5.1)
Sodium: 126 mmol/L — ABNORMAL LOW (ref 135–145)
TOTAL PROTEIN: 4.9 g/dL — AB (ref 6.5–8.1)

## 2018-01-08 LAB — BPAM RBC
BLOOD PRODUCT EXPIRATION DATE: 201909042359
Blood Product Expiration Date: 201909042359
ISSUE DATE / TIME: 201908052321
ISSUE DATE / TIME: 201908052007
UNIT TYPE AND RH: 5100
Unit Type and Rh: 5100

## 2018-01-08 LAB — TYPE AND SCREEN
ABO/RH(D): O POS
ANTIBODY SCREEN: NEGATIVE
UNIT DIVISION: 0
Unit division: 0

## 2018-01-08 SURGERY — EGD (ESOPHAGOGASTRODUODENOSCOPY)
Anesthesia: Monitor Anesthesia Care | Laterality: Left

## 2018-01-08 MED ORDER — PROPOFOL 500 MG/50ML IV EMUL
INTRAVENOUS | Status: DC | PRN
  Administered 2018-01-08: 125 ug/kg/min via INTRAVENOUS

## 2018-01-08 MED ORDER — SODIUM CHLORIDE 0.9% FLUSH
10.0000 mL | INTRAVENOUS | Status: DC | PRN
  Administered 2018-01-09: 20 mL
  Filled 2018-01-08: qty 40

## 2018-01-08 MED ORDER — PROPOFOL 10 MG/ML IV BOLUS
INTRAVENOUS | Status: AC
  Filled 2018-01-08: qty 40

## 2018-01-08 MED ORDER — PROPOFOL 10 MG/ML IV BOLUS
INTRAVENOUS | Status: DC | PRN
  Administered 2018-01-08: 20 mg via INTRAVENOUS

## 2018-01-08 MED ORDER — LACTULOSE 10 GM/15ML PO SOLN
30.0000 g | ORAL | Status: AC
  Administered 2018-01-08: 30 g via ORAL
  Filled 2018-01-08: qty 45

## 2018-01-08 MED ORDER — POLYETHYLENE GLYCOL 3350 17 G PO PACK
17.0000 g | PACK | Freq: Every day | ORAL | Status: DC
Start: 2018-01-08 — End: 2018-01-10
  Administered 2018-01-08: 17 g via ORAL
  Filled 2018-01-08 (×2): qty 1

## 2018-01-08 MED ORDER — PROPOFOL 10 MG/ML IV BOLUS
INTRAVENOUS | Status: AC
  Filled 2018-01-08: qty 20

## 2018-01-08 NOTE — Progress Notes (Signed)
Patient Demographics:    Lucas Holloway, is a 77 y.o. male, DOB - 02-07-1941, DDU:[id number]  Admit date - 01/07/2018   Admitting Physician Frederica Chrestman Denton Brick, MD  Outpatient Primary MD for the patient is Lucas Bien, MD  LOS - 0   Chief Complaint  Patient presents with  . Abnormal Lab        Subjective:    [name] today has no fevers, no emesis,  No chest pain,  No sob,    Assessment  & Plan :    Principal Problem:   Symptomatic Anemia Active Problems:   Pituitary macroadenoma (HCC)   Prostate cancer (HCC)   Malignant neoplasm metastatic to bone (HCC)   CT Abd/Pelvis IMPRESSION: 1. Some interval progression of osteoblastic metastasis since prior. 2. Increased stool retention consistent with constipation. No bowel obstruction or inflammation. 3. Small hiatal hernia with slight distal esophageal thickening raising suspicion for esophagitis.     Brief Summary  77 y.o. male   with castration-resistant prostate cancer diagnosed in 2009, initially treated by Dr. [name] the urologist, prostate cancer reoccurred in October 2016, patient has documented bony metastasis, at iinitial diagnosis of a Gleason score 6, PSA of 9 in 2009, currently getting Lupron shots every 4 months who was  admitted on 01/07/2018 with symptomatic anemia with hemoglobin down to 5.9 and heme positive stools in the setting of taking both Mobic and naproxen for arthralgias    Plan:- 1)Symptomatic Acute on chronic Anemia---  Hgb is up to 9.3 from around 5.9 on admission after transfusion of 2 units of packed cells on 01/07/2018 , baseline hemoglobin usually above 11, hemoglobin today around 5.9, , stool is Hemoccult positive and dark ,  EGD on 01/08/18 with significant esophagitis but no gastric or duodenal ulcers identified.  Chest service advises indefinite PPI for now, patient will stop  NSAID use (PTA was on Mobic AND Aleve),  c/n Protonix,   last colonoscopy was about 10 years ago.  Recheck CBC on 01/09/2018  2)Resistant metastatic prostate cancer with mets to the bone--patient sees Dr. [name], currently getting Taxotere chemotherapy started on December 04, 2017. S/p cycle 2 ,  He is receiving Xgeva every 8 weeks.This is been on hold because of dental procedures. He is currently on Lupron at 30 mg every 4 months.Last injection was given on December 04, 2017.  3)Leukocytosis- no obvious evidence of infection at this time white count is 18.2 from 19.3, ???  Reactive, CT abdomen and pelvis showed esophagitis and constipation,  patient has no fevers, no chills,  blood cultures pending, hold off on antibiotics at this time pending further clinical and lab data  4)Hyponatremia-this is chronic, patient's sodium is close to his baseline, which is in the mid 120s usually, avoid excessive free water intake.   Please note that patient has a pituitary adenoma  5)Esophagitis and hiatal hernia --- confirmed by EGD,  seen on CT abdomen as well, no obvious bleeding, continue PPI indefinitely avoid NSAIDs  6) constipation--laxatives as ordered   Code Status : DNR   Disposition Plan  : Possible discharge home on 01/09/2018 if H&H stable  Consults  :  Gi    DVT Prophylaxis  :    - SCDs   Lab  Results  Component Value Date   PLT 104 (L) 01/08/2018    Inpatient Medications  Scheduled Meds: . sodium chloride   Intravenous Once  . sodium chloride   Intravenous Once  . calcium-vitamin D  1 tablet Oral BID  . cholecalciferol  5,000 Units Oral Daily  . folic acid  1 mg Oral Daily  . lactulose  30 g Oral Q4H  . [START ON 04/06/2018] leuprolide  30 mg Intramuscular Q4 months  . levothyroxine  75 mcg Oral QAC breakfast  . multivitamin with minerals  1 tablet Oral Daily  . omega-3 acid ethyl esters  1 g Oral Daily  . pantoprazole  40 mg Intravenous Q12H  . polyethylene glycol  17 g Oral Daily  . sodium chloride flush  3 mL  Intravenous Q12H  . vitamin E  800 Units Oral Daily   Continuous Infusions: . sodium chloride 50 mL/hr at 01/08/18 0900   PRN Meds:.acetaminophen **OR** acetaminophen, albuterol, morphine injection, ondansetron **OR** ondansetron (ZOFRAN) IV, oxyCODONE, sodium chloride flush, sodium chloride flush, traZODone    Anti-infectives (From admission, onward)   None        Objective:   Vitals:   01/08/18 1330 01/08/18 1335 01/08/18 1340 01/08/18 1439  BP: (!) 65/47 98/65 (!) 92/53 135/73  Pulse: 76 87 78 78  Resp: 20 18 19 17   Temp:    (!) 97.3 F (36.3 C)  TempSrc:      SpO2: 92% 95% 96% 99%  Weight:      Height:        Wt Readings from Last 3 Encounters:  01/08/18 66.7 kg (147 lb)  01/07/18 66.9 kg (147 lb 8 oz)  12/27/17 66.1 kg (145 lb 12.8 oz)     Intake/Output Summary (Last 24 hours) at 01/08/2018 1454 Last data filed at 01/08/2018 1316 Gross per 24 hour  Intake 2601.25 ml  Output 2050 ml  Net 551.25 ml     Physical Exam  Gen:- Awake Alert,  In no apparent distress  HEENT:- [place].AT, No sclera icterus Neck-Supple Neck,No JVD,.  Lungs-  CTAB , good air movement, subclavian area with Port-A-Cath in situ CV- S1, S2 normal, regular Abd-  +ve B.Sounds, Abd Soft, No tenderness,    Extremity/Skin:- Trace edema,   Good pulses  Psych-affect is appropriate, oriented x3 Neuro-no new focal deficits, no tremors   Data Review:   Micro Results No results found for this or any previous visit (from the past 240 hour(s)).  Radiology Reports Ct Abdomen Pelvis W Contrast  Result Date: 01/07/2018 CLINICAL DATA:  Low hemoglobin with dark stools, weakness and loss of appetite. History of prostate cancer with metastasis to bone. EXAM: CT ABDOMEN AND PELVIS WITH CONTRAST TECHNIQUE: Multidetector CT imaging of the abdomen and pelvis was performed using the standard protocol following bolus administration of intravenous contrast. CONTRAST:  138mL ISOVUE-300 IOPAMIDOL (ISOVUE-300)  INJECTION 61% COMPARISON:  None. FINDINGS: Lower chest: The included heart size is normal. Minimal coronary arteriosclerosis is noted. Slight thickening of the distal esophagus associated small hiatal hernia. Esophagitis not entirely excluded. Small bilateral pleural effusions, new since prior exam on the right. Tiny calcified density at the right lung base may represent a small granuloma. Bibasilar atelectasis is identified adjacent to the pleural effusions. Hepatobiliary: Mild hepatic steatosis. No space-occupying mass noted of the liver. Physiologically distended gallbladder without stones or wall thickening. No biliary dilatation. Pancreas: Normal Spleen: Normal Adrenals/Urinary Tract: Normal bilateral adrenal glands. Symmetric cortical enhancement of both kidneys. Nonobstructing calculus  in the interpolar left kidney measuring approximately 2-3 mm. No hydroureteronephrosis. Urinary bladder is physiologically distended without focal mural thickening or calculi. Stomach/Bowel: The stomach is decompressed in appearance. There is normal small bowel rotation. Increased colonic stool retention is noted as before consistent with constipation. Normal-appearing appendix. Vascular/Lymphatic: Aortic and branch vessel atherosclerosis without aneurysm. Ectasia of the right common iliac artery atherosclerotic calcifications as before measuring up to 1.6 cm. Reproductive: Brachytherapy seeds noted in the prostate. Other: No free air nor free fluid. Musculoskeletal: Slight interval increase in osteoblastic metastasis of the axial and appendicular skeleton since prior, example right L5 transverse process involvement since prior exam, series 2/53 versus series 2/48 previously and posterior elements at T11 for example. Index right iliac lesion is stable at 2.9 cm, series 2/65 versus series 2/60. IMPRESSION: 1. Some interval progression of osteoblastic metastasis since prior. 2. Increased stool retention consistent with  constipation. No bowel obstruction or inflammation. 3. Small hiatal hernia with slight distal esophageal thickening raising suspicion for esophagitis. Electronically Signed   By: Ashley Royalty M.D.   On: 01/07/2018 19:19     CBC Recent Labs  Lab 01/07/18 1404 01/07/18 1658 01/08/18 0406  WBC 19.3* 19.3* 18.2*  HGB 5.9* 6.2* 9.3*  HCT 18.2* 18.7* 27.6*  PLT 119* 134* 104*  MCV 94.3 95.4 83.6  MCH 30.6 31.6 28.2  MCHC 32.4 33.2 33.7  RDW 20.8* 20.9* 26.2*  LYMPHSABS 1.3 1.2  --   MONOABS 1.6* 1.2*  --   EOSABS 0.0 0.0  --   BASOSABS 0.1 0.0  --     Chemistries  Recent Labs  Lab 01/07/18 1404 01/07/18 1658 01/08/18 0406  NA 121* 123* 126*  K 4.8 4.8 4.1  CL 90* 91* 95*  CO2 22 24 23   GLUCOSE 94 93 96  BUN 13 17 11   CREATININE 0.61 0.50* 0.53*  CALCIUM 7.6* 7.5* 7.1*  AST 29 29 31   ALT 11 13 11   ALKPHOS 174* 158* 141*  BILITOT 0.3 0.5 0.6   ------------------------------------------------------------------------------------------------------------------ No results for input(s): CHOL, HDL, LDLCALC, TRIG, CHOLHDL, LDLDIRECT in the last 72 hours.  No results found for: HGBA1C ------------------------------------------------------------------------------------------------------------------ No results for input(s): TSH, T4TOTAL, T3FREE, THYROIDAB in the last 72 hours.  Invalid input(s): FREET3 ------------------------------------------------------------------------------------------------------------------ No results for input(s): VITAMINB12, FOLATE, FERRITIN, TIBC, IRON, RETICCTPCT in the last 72 hours.  Coagulation profile Recent Labs  Lab 01/07/18 1658  INR 1.04    No results for input(s): DDIMER in the last 72 hours.  Cardiac Enzymes No results for input(s): CKMB, TROPONINI, MYOGLOBIN in the last 168 hours.  Invalid input(s): CK ------------------------------------------------------------------------------------------------------------------ No results  found for: BNP   Roxan Hockey M.D on 01/08/2018 at 2:54 PM   Go to [email or web address] - password [email or web address] for contact info  [facility] - Office  [phone]

## 2018-01-08 NOTE — Transfer of Care (Signed)
Immediate Anesthesia Transfer of Care Note  Patient: Lucas Holloway  Procedure(s) Performed: ESOPHAGOGASTRODUODENOSCOPY (EGD) (Left )  Patient Location: PACU  Anesthesia Type:MAC  Level of Consciousness: sedated  Airway & Oxygen Therapy: Patient Spontanous Breathing and Patient connected to nasal cannula oxygen  Post-op Assessment: Report given to RN and Post -op Vital signs reviewed and stable  Post vital signs: Reviewed and stable  Last Vitals:  Vitals Value Taken Time  BP    Temp    Pulse 75 01/08/2018  1:22 PM  Resp 22 01/08/2018  1:22 PM  SpO2 95 % 01/08/2018  1:22 PM  Vitals shown include unvalidated device data.  Last Pain:  Vitals:   01/08/18 1246  TempSrc: Oral  PainSc: 0-No pain         Complications: No apparent anesthesia complications

## 2018-01-08 NOTE — Interval H&P Note (Signed)
History and Physical Interval Note:  01/08/2018 1:06 PM  Lucas Holloway  has presented today for surgery, with the diagnosis of SEVERE IDA AND BLOOD IN STOOL  The various methods of treatment have been discussed with the patient and family. After consideration of risks, benefits and other options for treatment, the patient has consented to  Procedure(s): ESOPHAGOGASTRODUODENOSCOPY (EGD) (Left) as a surgical intervention .  The patient's history has been reviewed, patient examined, no change in status, stable for surgery.  I have reviewed the patient's chart and labs.  Questions were answered to the patient's satisfaction.     Hilde Churchman D

## 2018-01-08 NOTE — Anesthesia Preprocedure Evaluation (Signed)
Anesthesia Evaluation  Patient identified by MRN, date of birth, ID band Patient awake  General Assessment Comment:Metastatic prostate ca  Reviewed: Allergy & Precautions, NPO status , Patient's Chart, lab work & pertinent test results  Airway Mallampati: II  TM Distance: >3 FB Neck ROM: Full    Dental no notable dental hx.    Pulmonary neg pulmonary ROS, former smoker,    Pulmonary exam normal breath sounds clear to auscultation       Cardiovascular negative cardio ROS Normal cardiovascular exam Rhythm:Regular Rate:Normal     Neuro/Psych negative neurological ROS  negative psych ROS   GI/Hepatic negative GI ROS, Neg liver ROS,   Endo/Other  negative endocrine ROS  Renal/GU negative Renal ROS  negative genitourinary   Musculoskeletal negative musculoskeletal ROS (+)   Abdominal   Peds negative pediatric ROS (+)  Hematology  (+) anemia ,   Anesthesia Other Findings   Reproductive/Obstetrics negative OB ROS                             Anesthesia Physical Anesthesia Plan  ASA: IV  Anesthesia Plan: MAC   Post-op Pain Management:    Induction: Intravenous  PONV Risk Score and Plan: 0  Airway Management Planned: Simple Face Mask  Additional Equipment:   Intra-op Plan:   Post-operative Plan:   Informed Consent: I have reviewed the patients History and Physical, chart, labs and discussed the procedure including the risks, benefits and alternatives for the proposed anesthesia with the patient or authorized representative who has indicated his/her understanding and acceptance.   Dental advisory given  Plan Discussed with: CRNA and Surgeon  Anesthesia Plan Comments:         Anesthesia Quick Evaluation

## 2018-01-08 NOTE — Care Management Obs Status (Signed)
[facility] NOTIFICATION   Patient Details  Name: Lucas Holloway MRN: [id number] Date of Birth: [birth year]   Medicare Observation Status Notification Given:  Yes    Leeroy Cha, RN 01/08/2018, 3:22 PM

## 2018-01-08 NOTE — Anesthesia Postprocedure Evaluation (Signed)
Anesthesia Post Note  Patient: Lucas Holloway  Procedure(s) Performed: ESOPHAGOGASTRODUODENOSCOPY (EGD) (Left )     Patient location during evaluation: PACU Anesthesia Type: MAC Level of consciousness: awake and alert Pain management: pain level controlled Vital Signs Assessment: post-procedure vital signs reviewed and stable Respiratory status: spontaneous breathing, nonlabored ventilation, respiratory function stable and patient connected to nasal cannula oxygen Cardiovascular status: stable and blood pressure returned to baseline Postop Assessment: no apparent nausea or vomiting Anesthetic complications: no    Last Vitals:  Vitals:   01/08/18 1335 01/08/18 1340  BP: 98/65 (!) 92/53  Pulse: 87 78  Resp: 18 19  Temp:    SpO2: 95% 96%    Last Pain:  Vitals:   01/08/18 1323  TempSrc: Oral  PainSc: 0-No pain                 Raeann Offner S

## 2018-01-08 NOTE — Op Note (Signed)
[facility] Patient Name: Lucas Holloway Procedure Date: 01/08/2018 MRN: [id number] Attending MD: Carol Ada , MD Date of Birth: Jun 27, 1940 CSN: [id number] Age: 77 Admit Type: Inpatient Procedure:                Upper GI endoscopy Indications:              Iron deficiency anemia, Melena Providers:                Carol Ada, MD, Angus Seller, William Dalton,                            Technician Referring MD:              Medicines:                Propofol per Anesthesia Complications:            No immediate complications. Estimated Blood Loss:     Estimated blood loss: none. Procedure:                Pre-Anesthesia Assessment:                           - Prior to the procedure, a History and Physical                            was performed, and patient medications and                            allergies were reviewed. The patient's tolerance of                            previous anesthesia was also reviewed. The risks                            and benefits of the procedure and the sedation                            options and risks were discussed with the patient.                            All questions were answered, and informed consent                            was obtained. Prior Anticoagulants: The patient has                            taken no previous anticoagulant or antiplatelet                            agents. ASA Grade Assessment: III - A patient with                            severe systemic disease. After reviewing the risks  and benefits, the patient was deemed in                            satisfactory condition to undergo the procedure.                           - Sedation was administered by an anesthesia                            professional. Deep sedation was attained.                           After obtaining informed consent, the endoscope was                            passed under direct vision.  Throughout the                            procedure, the patient's blood pressure, pulse, and                            oxygen saturations were monitored continuously. The                            [id number]-H190 ([id number]) Olympus adult endoscope was                            introduced through the mouth, and advanced to the                            second part of duodenum. The upper GI endoscopy was                            accomplished without difficulty. The patient                            tolerated the procedure well. Scope In: Scope Out: Findings:      LA Grade D (one or more mucosal breaks involving at least 75% of       esophageal circumference) esophagitis with no bleeding was found.      A 4 cm hiatal hernia was present.      The stomach was normal.      The examined duodenum was normal. Impression:               - LA Grade D reflux esophagitis.                           - 4 cm hiatal hernia.                           - Normal stomach.                           - Normal examined duodenum.                           -  No specimens collected. Moderate Sedation:      N/A- Per Anesthesia Care Recommendation:           - Return patient to hospital ward for ongoing care.                           - Resume regular diet.                           - Continue present medications.                           - The patient will need to remain on a PPI                            indefinitely. Procedure Code(s):        --- Professional ---                           [phone], Esophagogastroduodenoscopy, flexible,                            transoral; diagnostic, including collection of                            specimen(s) by brushing or washing, when performed                            (separate procedure) Diagnosis Code(s):        --- Professional ---                           K21.0, Gastro-esophageal reflux disease with                            esophagitis                            K44.9, Diaphragmatic hernia without obstruction or                            gangrene                           D50.9, Iron deficiency anemia, unspecified                           K92.1, Melena (includes Hematochezia) CPT copyright 2017 American [employer] Association. All rights reserved. The codes documented in this report are preliminary and upon coder review may  be revised to meet current compliance requirements. Carol Ada, MD Carol Ada, MD 01/08/2018 1:23:06 PM This report has been signed electronically. Number of Addenda: 0

## 2018-01-09 ENCOUNTER — Encounter (HOSPITAL_COMMUNITY): Payer: Self-pay | Admitting: Gastroenterology

## 2018-01-09 DIAGNOSIS — C7951 Secondary malignant neoplasm of bone: Secondary | ICD-10-CM

## 2018-01-09 DIAGNOSIS — D352 Benign neoplasm of pituitary gland: Secondary | ICD-10-CM

## 2018-01-09 DIAGNOSIS — D649 Anemia, unspecified: Secondary | ICD-10-CM

## 2018-01-09 DIAGNOSIS — C61 Malignant neoplasm of prostate: Secondary | ICD-10-CM

## 2018-01-09 LAB — CBC
HCT: 27.6 % — ABNORMAL LOW (ref 39.0–52.0)
HCT: 27.9 % — ABNORMAL LOW (ref 39.0–52.0)
HEMOGLOBIN: 9.3 g/dL — AB (ref 13.0–17.0)
Hemoglobin: 9.3 g/dL — ABNORMAL LOW (ref 13.0–17.0)
MCH: 28.2 pg (ref 26.0–34.0)
MCH: 28.1 pg (ref 26.0–34.0)
MCHC: 33.7 g/dL (ref 30.0–36.0)
MCHC: 33.3 g/dL (ref 30.0–36.0)
MCV: 83.6 fL (ref 78.0–100.0)
MCV: 84.3 fL (ref 78.0–100.0)
Platelets: 104 10*3/uL — ABNORMAL LOW (ref 150–400)
Platelets: 98 10*3/uL — ABNORMAL LOW (ref 150–400)
RBC: 3.3 MIL/uL — ABNORMAL LOW (ref 4.22–5.81)
RBC: 3.31 MIL/uL — AB (ref 4.22–5.81)
RDW: 26.2 % — AB (ref 11.5–15.5)
RDW: 27.6 % — ABNORMAL HIGH (ref 11.5–15.5)
WBC: 18.2 10*3/uL — AB (ref 4.0–10.5)
WBC: 14.1 10*3/uL — ABNORMAL HIGH (ref 4.0–10.5)

## 2018-01-09 LAB — BASIC METABOLIC PANEL
Anion gap: 7 (ref 5–15)
BUN: 10 mg/dL (ref 8–23)
CHLORIDE: 96 mmol/L — AB (ref 98–111)
CO2: 24 mmol/L (ref 22–32)
Calcium: 7.7 mg/dL — ABNORMAL LOW (ref 8.9–10.3)
Creatinine, Ser: 0.6 mg/dL — ABNORMAL LOW (ref 0.61–1.24)
GFR calc non Af Amer: >60 mL/min (ref >60)
Glucose, Bld: 74 mg/dL (ref 70–99)
POTASSIUM: 4.4 mmol/L (ref 3.5–5.1)
Sodium: 127 mmol/L — ABNORMAL LOW (ref 135–145)

## 2018-01-09 NOTE — Evaluation (Signed)
Physical Therapy Evaluation Patient Details Name: Lucas Holloway MRN: [id number] DOB: 1940/12/27 Today's Date: 01/09/2018   History of Present Illness  77 yo male admitted with anemia, weakness, leukocytosis, hyponatremia. Hx of anemia, metastatic prostate cancer-on chemo  Clinical Impression  On eval, pt was Min guard assist for mobility. He walked ~70 feet with a RW. Pt presents with general weakness, decreased activity tolerance, and impaired gait and balance. Min encouragement required for participation. Discussed d/c plan-pt will return home with his wife assisting as needed. He declines placement. Pt/wife are agreeable to HHPT f/u. Pt already has a RW and WC available at home.     Follow Up Recommendations Home health PT;Supervision/Assistance - 24 hour    Equipment Recommendations   No DME needs    Recommendations for Other Services       Precautions / Restrictions Precautions Precautions: Fall Restrictions Weight Bearing Restrictions: No      Mobility  Bed Mobility Overal bed mobility: Needs Assistance Bed Mobility: Supine to Sit;Sit to Supine     Supine to sit: Supervision Sit to supine: Supervision   General bed mobility comments: for safety, lines  Transfers Overall transfer level: Needs assistance Equipment used: Rolling walker (2 wheeled) Transfers: Sit to/from Stand Sit to Stand: Min guard;From elevated surface         General transfer comment: VCs safety, hand placement. Close guard for safety.   Ambulation/Gait Ambulation/Gait assistance: Min guard Gait Distance (Feet): 70 Feet Assistive device: Rolling walker (2 wheeled) Gait Pattern/deviations: Step-through pattern;Decreased stride length;Trunk flexed     General Gait Details: close guard for safety. Vc safety, proper use of RW. slow gait speed.   Stairs            Wheelchair Mobility    Modified Rankin (Stroke Patients Only)       Balance Overall balance assessment: Needs  assistance         Standing balance support: Bilateral upper extremity supported   Standing balance comment: requires RW currently                             Pertinent Vitals/Pain Pain Assessment: Faces Faces Pain Scale: Hurts little more Pain Location: both feet Pain Descriptors / Indicators: Sore Pain Intervention(s): Monitored during session    Home Living Family/patient expects to be discharged to:: Private residence Living Arrangements: Spouse/significant other Available Help at Discharge: Family Type of Home: House Home Access: Stairs to enter Entrance Stairs-Rails: None Entrance Stairs-Number of Steps: 2 Home Layout: One level Home Equipment: Environmental consultant - 2 wheels;Wheelchair - manual      Prior Function Level of Independence: Independent               Hand Dominance        Extremity/Trunk Assessment   Upper Extremity Assessment Upper Extremity Assessment: Generalized weakness    Lower Extremity Assessment Lower Extremity Assessment: Generalized weakness    Cervical / Trunk Assessment Cervical / Trunk Assessment: Kyphotic  Communication   Communication: No difficulties  Cognition Arousal/Alertness: Awake/alert Behavior During Therapy: WFL for tasks assessed/performed Overall Cognitive Status: Within Functional Limits for tasks assessed                                        General Comments      Exercises     Assessment/Plan    PT  Assessment Patient needs continued PT services  PT Problem List Decreased balance;Decreased mobility;Decreased strength;Decreased activity tolerance;Decreased knowledge of use of DME       PT Treatment Interventions DME instruction;Gait training;Functional mobility training;Therapeutic activities;Balance training;Patient/family education;Therapeutic exercise    PT Goals (Current goals can be found in the Care Plan section)  Acute Rehab PT Goals Patient Stated Goal: to feel better.  home.  PT Goal Formulation: With patient/family Time For Goal Achievement: 01/23/18 Potential to Achieve Goals: Good    Frequency Min 3X/week   Barriers to discharge        Co-evaluation               AM-PAC PT "6 Clicks" Daily Activity  Outcome Measure Difficulty turning over in bed (including adjusting bedclothes, sheets and blankets)?: A Little Difficulty moving from lying on back to sitting on the side of the bed? : A Little Difficulty sitting down on and standing up from a chair with arms (e.g., wheelchair, bedside commode, etc,.)?: A Little Help needed moving to and from a bed to chair (including a wheelchair)?: A Little Help needed walking in hospital room?: A Little Help needed climbing 3-5 steps with a railing? : A Lot 6 Click Score: 17    End of Session Equipment Utilized During Treatment: Gait belt Activity Tolerance: Patient limited by fatigue Patient left: in bed;with call bell/phone within reach;with bed alarm set;with family/visitor present   PT Visit Diagnosis: Muscle weakness (generalized) (M62.81);Difficulty in walking, not elsewhere classified (R26.2)    Time: 8101-7510 PT Time Calculation (min) (ACUTE ONLY): 21 min   Charges:   PT Evaluation $PT Eval Moderate Complexity: 1 Mod           Weston Anna, MPT Pager: [phone]

## 2018-01-09 NOTE — Progress Notes (Signed)
PROGRESS NOTE  Lucas Holloway MOQ:[id number] DOB: November 08, 1940 DOA: 01/07/2018 PCP: [name], MD  HPI/Recap of past 43 hours: 77 year old male with past medical history significant for castration resistant prostate cancer diagnosed in 2009, initially treated with recurrence in October 2016 with bony metastasis, currently getting Lupron shots every 4 months, admitted on 01/07/2018 with symptomatic anemia with hemoglobin of 5.9 and heme positive stools, in the setting of NSAID use. Pt admitted for further management.  Today, patient denies any new complaints, reports feeling better overall.  Denies any fever/chills, abdominal pain, chest pain, shortness of breath.  Assessment/Plan: Principal Problem:   Symptomatic Anemia Active Problems:   Pituitary macroadenoma (HCC)   Prostate cancer (HCC)   Malignant neoplasm metastatic to bone (HCC)  Acute on chronic anemia of chronic disease Stable at 9.3 Likely due to LA grade D esophagitis 2/2 NSAID use Hemoglobin baseline around 11---> 5.9 on admission FOBT positive CT abdomen pelvis showed interval progression of osteoblastic mets, increased stool retention, small hiatal hernia, esophagitis Status post 2U of PRBC on 01/07/18 EGD on 01/08/2018 showed LA grade D esophagitis, no gastric or duodenal ulcers identified, no active bleeding Daily CBC  LA grade D Esophagitis Confirmed by EGD GI on board, continue PPI indefinitely, avoid NSAIDs  Leukocytosis Afebrile, downward trend ??  Reactive CT abdomen pelvis as above BC x2 pending Daily CBC  Hyponatremia Chronic, at his baseline Daily BMP  Resistance metastatic prostate cancer with mets to the bone Follows with oncology Currently on Lupron every 4 months, last December 04, 2017  Constipation Continue laxatives    Code Status: DNR  Family Communication: None at bedside  Disposition Plan: Plan for discharge on  01/10/2018   Consultants:  GI  Procedures:  EGD  Antimicrobials:  None  DVT prophylaxis:  SCDs   Objective: Vitals:   01/08/18 1439 01/08/18 1529 01/08/18 2002 01/09/18 0501  BP: 135/73  137/67 121/68  Pulse: 78  81 83  Resp: 17  18 19   Temp: (!) 97.3 F (36.3 C)  98.1 F (36.7 C) 97.7 F (36.5 C)  TempSrc:   Oral Oral  SpO2: 99% 91% 97% 97%  Weight:      Height:        Intake/Output Summary (Last 24 hours) at 01/09/2018 1416 Last data filed at 01/09/2018 1201 Gross per 24 hour  Intake 136.8 ml  Output 1325 ml  Net -1188.2 ml   Filed Weights   01/07/18 2021 01/08/18 1246  Weight: 69.4 kg (153 lb) 66.7 kg (147 lb)    Exam:   General: NAD  Cardiovascular: S1, S2 present   Respiratory: CTAB  Abdomen: Soft, NT, ND, BS present  Musculoskeletal: No pedal edema b/l  Skin: Normal  Psychiatry: Normal mood   Data Reviewed: CBC: Recent Labs  Lab 01/07/18 1404 01/07/18 1658 01/08/18 0406 01/09/18 0423  WBC 19.3* 19.3* 18.2* 14.1*  NEUTROABS 16.2* 16.9*  --   --   HGB 5.9* 6.2* 9.3* 9.3*  HCT 18.2* 18.7* 27.6* 27.9*  MCV 94.3 95.4 83.6 84.3  PLT 119* 134* 104* 98*   Basic Metabolic Panel: Recent Labs  Lab 01/07/18 1404 01/07/18 1658 01/08/18 0406 01/09/18 0423  NA 121* 123* 126* 127*  K 4.8 4.8 4.1 4.4  CL 90* 91* 95* 96*  CO2 22 24 23 24   GLUCOSE 94 93 96 74  BUN 13 17 11 10   CREATININE 0.61 0.50* 0.53* 0.60*  CALCIUM 7.6* 7.5* 7.1* 7.7*   GFR: Estimated Creatinine Clearance: 74.1  mL/min (A) (by C-G formula based on SCr of 0.6 mg/dL (L)). Liver Function Tests: Recent Labs  Lab 01/07/18 1404 01/07/18 1658 01/08/18 0406  AST 29 29 31   ALT 11 13 11   ALKPHOS 174* 158* 141*  BILITOT 0.3 0.5 0.6  PROT 5.1* 5.1* 4.9*  ALBUMIN 2.4* 2.5* 2.4*   No results for input(s): LIPASE, AMYLASE in the last 168 hours. No results for input(s): AMMONIA in the last 168 hours. Coagulation Profile: Recent Labs  Lab 01/07/18 1658  INR 1.04    Cardiac Enzymes: No results for input(s): CKTOTAL, CKMB, CKMBINDEX, TROPONINI in the last 168 hours. BNP (last 3 results) No results for input(s): PROBNP in the last 8760 hours. HbA1C: No results for input(s): HGBA1C in the last 72 hours. CBG: No results for input(s): GLUCAP in the last 168 hours. Lipid Profile: No results for input(s): CHOL, HDL, LDLCALC, TRIG, CHOLHDL, LDLDIRECT in the last 72 hours. Thyroid Function Tests: No results for input(s): TSH, T4TOTAL, FREET4, T3FREE, THYROIDAB in the last 72 hours. Anemia Panel: No results for input(s): VITAMINB12, FOLATE, FERRITIN, TIBC, IRON, RETICCTPCT in the last 72 hours. Urine analysis:    Component Value Date/Time   COLORURINE YELLOW 01/07/2018 [place] 01/07/2018 1658   LABSPEC 1.015 01/07/2018 1658   PHURINE 6.0 01/07/2018 1658   GLUCOSEU NEGATIVE 01/07/2018 1658   HGBUR NEGATIVE 01/07/2018 1658   BILIRUBINUR NEGATIVE 01/07/2018 1658   KETONESUR NEGATIVE 01/07/2018 1658   PROTEINUR NEGATIVE 01/07/2018 1658   UROBILINOGEN 0.2 02/14/2012 2102   NITRITE NEGATIVE 01/07/2018 1658   LEUKOCYTESUR NEGATIVE 01/07/2018 1658   Sepsis Labs: @LABRCNTIP (procalcitonin:4,lacticidven:4)  ) Recent Results (from the past 240 hour(s))  Culture, blood (Routine X 2) w Reflex to ID Panel     Status: None (Preliminary result)   Collection Time: 01/08/18  3:22 PM  Result Value Ref Range Status   Specimen Description   Final    BLOOD RIGHT ANTECUBITAL Performed at [facility], [place] [address], [place], [place] [address]    Special Requests   Final    BOTTLES DRAWN AEROBIC AND ANAEROBIC Blood Culture adequate volume Performed at [facility] [address] [place], [place] [address]    Culture   Final    NO GROWTH < 24 HOURS Performed at [facility] [address], [place], [place] [address]    Report Status PENDING  Incomplete  Culture, blood (Routine X 2) w Reflex to  ID Panel     Status: None (Preliminary result)   Collection Time: 01/08/18  3:22 PM  Result Value Ref Range Status   Specimen Description   Final    BLOOD RIGHT HAND Performed at [facility] [address], [place], [place] [address]    Special Requests   Final    BOTTLES DRAWN AEROBIC ONLY Blood Culture adequate volume Performed at [facility] [address] [place], [place] [address]    Culture   Final    NO GROWTH < 24 HOURS Performed at [facility] [address], [place], [place] [address]    Report Status PENDING  Incomplete      Studies: No results found.  Scheduled Meds: . calcium-vitamin D  1 tablet Oral BID  . cholecalciferol  5,000 Units Oral Daily  . folic acid  1 mg Oral Daily  . [START ON 04/06/2018] leuprolide  30 mg Intramuscular Q4 months  . levothyroxine  75 mcg Oral  QAC breakfast  . multivitamin with minerals  1 tablet Oral Daily  . omega-3 acid ethyl esters  1 g Oral Daily  . pantoprazole  40 mg Intravenous Q12H  . polyethylene glycol  17 g Oral Daily  . sodium chloride flush  3 mL Intravenous Q12H  . vitamin E  800 Units Oral Daily    Continuous Infusions: . sodium chloride 10 mL/hr at 01/08/18 1614     LOS: 1 day     Alma Friendly, MD [facility]   If 7PM-7AM, please contact night-coverage [email or web address] Password [email or web address] 01/09/2018, 2:16 PM

## 2018-01-09 NOTE — Progress Notes (Signed)
[facility] Progress Note   Ousmane Seeman [id number] 1941/03/07 77 y.o.  Akio Hudnall is managed by Dr. [name]  Actively treated with chemotherapy/immunotherapy: yes  Current Therapy: Taxotere with PEG filgrastim support and Lupron  Last Treated: 12/27/2017 (cycle 2)  Assessment: Plan:    Prostate cancer ([place])  Anemia, unspecified type   Metastatic prostate cancer: Mr. Madewell is managed by Dr. [name] and is status post cycle 2 of Taxotere and PEG filgrastim support.  He continues on Lupron every 4 months.  Anemia with hematochezia: A CBC returned today with a hemoglobin of  5.9.  Was transported to the emergency room for evaluation and management.  Please see After Visit Summary for patient specific instructions.  Future Appointments  Date Time Provider [place]  01/17/2018  8:15 AM [facility] LAB 2 [facility] None  01/17/2018  8:30 AM [facility] [place] FLUSH [facility] None  01/17/2018  9:15 AM Wyatt Portela, MD [facility] None  01/17/2018  9:30 AM [facility] INFUSION [facility] None  01/18/2018 10:45 AM [facility] FLUSH [facility] None  02/07/2018 11:15 AM [facility] LAB 2 [facility] None  02/07/2018 11:30 AM [facility] [place] FLUSH [facility] None  02/07/2018 12:00 PM Shadad, Mathis Dad, MD [facility] None  02/07/2018  1:15 PM [facility] INFUSION [facility] None  02/08/2018  3:30 PM [facility] [place] FLUSH [facility] None    No orders of the defined types were placed in this encounter.      Subjective:   Patient ID:  Lott Seelbach is a 77 y.o. (DOB [birth year]) male.  Chief Complaint:  Chief Complaint  Patient presents with  . Fatigue    HPI Niyam Bisping is a 78 year old male with a history of a metastatic colon cancer.  He is status post  status post cycle 2 of Taxotere and PEG filgrastim support which was dosed on 12/27/2017.  He continues on Lupron every 4 months.  He presents to the clinic today with his wife.  He reports that he has been progressively weaker  over the last month.  His wife reports that he has been having black stools since starting chemotherapy in July.  He has chills but denies fevers or sweats.  A CBC from today returned showing a hemoglobin of 5.9.  Medications: I have reviewed the patient's current medications.  Allergies: No Known Allergies  Past Medical History:  Diagnosis Date  . Polyarticular psoriatic arthritis ([place])   . Prostate cancer [facility])     Past Surgical History:  Procedure Laterality Date  . ESOPHAGOGASTRODUODENOSCOPY Left 01/08/2018   Procedure: ESOPHAGOGASTRODUODENOSCOPY (EGD);  Surgeon: Carol Ada, MD;  Location: [facility];  Service: Endoscopy;  Laterality: Left;  . IR IMAGING GUIDED PORT INSERTION  11/28/2017  . radioactive seed implant prostate  2009    Family History  Problem Relation Age of Onset  . Cancer Sister        Ovarian    Social History   Socioeconomic History  . Marital status: Married    Spouse name: Not on file  . Number of children: Not on file  . Years of education: Not on file  . Highest education level: Not on file  Occupational History  . Not on file  Social Needs  . Financial resource strain: Not on file  . Food insecurity:    Worry: Not on file    Inability: Not on file  . Transportation needs:    Medical: Not on file    Non-medical: Not on file  Tobacco Use  . Smoking status:  Former Smoker  . Smokeless tobacco: Never Used  Substance and Sexual Activity  . Alcohol use: No    Alcohol/week: 0.0 oz  . Drug use: No  . Sexual activity: Not on file  Lifestyle  . Physical activity:    Days per week: Not on file    Minutes per session: Not on file  . Stress: Not on file  Relationships  . Social connections:    Talks on phone: Not on file    Gets together: Not on file    Attends religious service: Not on file    Active member of club or organization: Not on file    Attends meetings of clubs or organizations: Not on file    Relationship status: Not on file    . Intimate partner violence:    Fear of current or ex partner: Not on file    Emotionally abused: Not on file    Physically abused: Not on file    Forced sexual activity: Not on file  Other Topics Concern  . Not on file  Social History Narrative  . Not on file    Past Medical History, Surgical history, Social history, and Family history were reviewed and updated as appropriate.   Please see review of systems for further details on the patient's review from today.   Review of Systems:  Review of Systems  Constitutional: Negative for appetite change, chills, diaphoresis and fever.  HENT: Negative for dental problem, mouth sores and trouble swallowing.   Respiratory: Negative for cough, chest tightness and shortness of breath.   Cardiovascular: Negative for chest pain and palpitations.  Gastrointestinal: Negative for constipation, diarrhea, nausea and vomiting.       Black stools for 1 month.  Skin: Positive for pallor.  Neurological: Positive for dizziness and weakness. Negative for syncope and headaches.    Objective:   Physical Exam:  BP (!) 94/47 (BP Location: Right Arm, Patient Position: Sitting)   Pulse 98   Temp 98 F (36.7 C) (Oral)   Resp 18   Wt 147 lb 8 oz (66.9 kg)   SpO2 98%   BMI 22.43 kg/m  ECOG: 1  Physical Exam  Constitutional: No distress.  HENT:  Head: Normocephalic and atraumatic.  Eyes:  Conjunctiva is pale.  Cardiovascular: Normal rate, regular rhythm and normal heart sounds. Exam reveals no gallop and no friction rub.  No murmur heard. Pulmonary/Chest: Effort normal and breath sounds normal. No respiratory distress. He has no wheezes. He has no rales.  Neurological: He is alert. Coordination (The patient is ambulating with the use of a wheelchair.) abnormal.  Skin: Skin is warm and dry. Capillary refill takes more than 3 seconds. No rash noted. He is not diaphoretic. No erythema. There is pallor.    Lab Review:     Component Value  Date/Time   NA 127 (L) 01/09/2018 0423   NA 125 (L) 06/01/2017 1406   K 4.4 01/09/2018 0423   K 4.6 06/01/2017 1406   CL 96 (L) 01/09/2018 0423   CO2 24 01/09/2018 0423   CO2 23 06/01/2017 1406   GLUCOSE 74 01/09/2018 0423   GLUCOSE 92 06/01/2017 1406   BUN 10 01/09/2018 0423   BUN 20.6 06/01/2017 1406   CREATININE 0.60 (L) 01/09/2018 0423   CREATININE 0.61 01/07/2018 1404   CREATININE 0.7 06/01/2017 1406   CALCIUM 7.7 (L) 01/09/2018 0423   CALCIUM 9.1 06/01/2017 1406   PROT 4.9 (L) 01/08/2018 0406   PROT 6.7 06/01/2017  1406   ALBUMIN 2.4 (L) 01/08/2018 0406   ALBUMIN 3.3 (L) 06/01/2017 1406   AST 31 01/08/2018 0406   AST 29 01/07/2018 1404   AST 29 06/01/2017 1406   ALT 11 01/08/2018 0406   ALT 11 01/07/2018 1404   ALT 20 06/01/2017 1406   ALKPHOS 141 (H) 01/08/2018 0406   ALKPHOS 94 06/01/2017 1406   BILITOT 0.6 01/08/2018 0406   BILITOT 0.3 01/07/2018 1404   BILITOT 0.40 06/01/2017 1406   GFRNONAA >60 01/09/2018 0423   GFRNONAA >60 01/07/2018 1404   GFRAA >60 01/09/2018 0423   GFRAA >60 01/07/2018 1404       Component Value Date/Time   WBC 14.1 (H) 01/09/2018 0423   RBC 3.31 (L) 01/09/2018 0423   HGB 9.3 (L) 01/09/2018 0423   HGB 5.9 (LL) 01/07/2018 1404   HGB 13.4 06/01/2017 1406   HCT 27.9 (L) 01/09/2018 0423   HCT 39.6 06/01/2017 1406   PLT 98 (L) 01/09/2018 0423   PLT 119 (L) 01/07/2018 1404   PLT 263 06/01/2017 1406   MCV 84.3 01/09/2018 0423   MCV 93.8 06/01/2017 1406   MCH 28.1 01/09/2018 0423   MCHC 33.3 01/09/2018 0423   RDW 27.6 (H) 01/09/2018 0423   RDW 13.9 06/01/2017 1406   LYMPHSABS 1.2 01/07/2018 1658   LYMPHSABS 0.8 (L) 06/01/2017 1406   MONOABS 1.2 (H) 01/07/2018 1658   MONOABS 0.6 06/01/2017 1406   EOSABS 0.0 01/07/2018 1658   EOSABS 0.2 06/01/2017 1406   BASOSABS 0.0 01/07/2018 1658   BASOSABS 0.1 06/01/2017 1406   -------------------------------  Imaging from last 24 hours (if applicable):  Radiology interpretation: Ct  Abdomen Pelvis W Contrast  Result Date: 01/07/2018 CLINICAL DATA:  Low hemoglobin with dark stools, weakness and loss of appetite. History of prostate cancer with metastasis to bone. EXAM: CT ABDOMEN AND PELVIS WITH CONTRAST TECHNIQUE: Multidetector CT imaging of the abdomen and pelvis was performed using the standard protocol following bolus administration of intravenous contrast. CONTRAST:  168mL ISOVUE-300 IOPAMIDOL (ISOVUE-300) INJECTION 61% COMPARISON:  None. FINDINGS: Lower chest: The included heart size is normal. Minimal coronary arteriosclerosis is noted. Slight thickening of the distal esophagus associated small hiatal hernia. Esophagitis not entirely excluded. Small bilateral pleural effusions, new since prior exam on the right. Tiny calcified density at the right lung base may represent a small granuloma. Bibasilar atelectasis is identified adjacent to the pleural effusions. Hepatobiliary: Mild hepatic steatosis. No space-occupying mass noted of the liver. Physiologically distended gallbladder without stones or wall thickening. No biliary dilatation. Pancreas: Normal Spleen: Normal Adrenals/Urinary Tract: Normal bilateral adrenal glands. Symmetric cortical enhancement of both kidneys. Nonobstructing calculus in the interpolar left kidney measuring approximately 2-3 mm. No hydroureteronephrosis. Urinary bladder is physiologically distended without focal mural thickening or calculi. Stomach/Bowel: The stomach is decompressed in appearance. There is normal small bowel rotation. Increased colonic stool retention is noted as before consistent with constipation. Normal-appearing appendix. Vascular/Lymphatic: Aortic and branch vessel atherosclerosis without aneurysm. Ectasia of the right common iliac artery atherosclerotic calcifications as before measuring up to 1.6 cm. Reproductive: Brachytherapy seeds noted in the prostate. Other: No free air nor free fluid. Musculoskeletal: Slight interval increase in  osteoblastic metastasis of the axial and appendicular skeleton since prior, example right L5 transverse process involvement since prior exam, series 2/53 versus series 2/48 previously and posterior elements at T11 for example. Index right iliac lesion is stable at 2.9 cm, series 2/65 versus series 2/60. IMPRESSION: 1. Some interval progression of osteoblastic metastasis  since prior. 2. Increased stool retention consistent with constipation. No bowel obstruction or inflammation. 3. Small hiatal hernia with slight distal esophageal thickening raising suspicion for esophagitis. Electronically Signed   By: Ashley Royalty M.D.   On: 01/07/2018 19:19

## 2018-01-10 LAB — CBC WITH DIFFERENTIAL/PLATELET
Basophils Absolute: 0 10*3/uL (ref 0.0–0.1)
Basophils Relative: 0 %
Eosinophils Absolute: 0 10*3/uL (ref 0.0–0.7)
Eosinophils Relative: 0 %
HEMATOCRIT: 27.7 % — AB (ref 39.0–52.0)
Hemoglobin: 9.3 g/dL — ABNORMAL LOW (ref 13.0–17.0)
LYMPHS ABS: 1.2 10*3/uL (ref 0.7–4.0)
LYMPHS PCT: 12 %
MCH: 28.3 pg (ref 26.0–34.0)
MCHC: 33.6 g/dL (ref 30.0–36.0)
MCV: 84.2 fL (ref 78.0–100.0)
MONOS PCT: 9 %
Monocytes Absolute: 0.9 10*3/uL (ref 0.1–1.0)
NEUTROS ABS: 7.8 10*3/uL (ref 1.7–7.7)
NEUTROS PCT: 79 %
Platelets: 97 10*3/uL — ABNORMAL LOW (ref 150–400)
RBC: 3.29 MIL/uL — ABNORMAL LOW (ref 4.22–5.81)
RDW: 27.7 % — ABNORMAL HIGH (ref 11.5–15.5)
WBC: 10 10*3/uL (ref 4.0–10.5)

## 2018-01-10 MED ORDER — HEPARIN SOD (PORK) LOCK FLUSH 100 UNIT/ML IV SOLN
500.0000 [IU] | INTRAVENOUS | Status: AC | PRN
  Administered 2018-01-10: 500 [IU]

## 2018-01-10 MED ORDER — FERROUS SULFATE 325 (65 FE) MG PO TBEC: 325.0000 mg | DELAYED_RELEASE_TABLET | Freq: Two times a day (BID) | ORAL | 0 refills | Status: DC

## 2018-01-10 MED ORDER — TRAMADOL HCL 50 MG PO TABS: 50.0000 mg | ORAL_TABLET | Freq: Four times a day (QID) | ORAL | 0 refills | Status: AC | PRN

## 2018-01-10 MED ORDER — PANTOPRAZOLE SODIUM 40 MG PO TBEC: 40.0000 mg | DELAYED_RELEASE_TABLET | Freq: Two times a day (BID) | ORAL | 0 refills | Status: DC

## 2018-01-10 MED ORDER — SENNA 8.6 MG PO TABS: 1.0000 | ORAL_TABLET | Freq: Every day | ORAL | 0 refills | Status: DC

## 2018-01-10 MED ORDER — PANTOPRAZOLE SODIUM 40 MG PO TBEC: 40.0000 mg | DELAYED_RELEASE_TABLET | Freq: Two times a day (BID) | ORAL | Status: DC

## 2018-01-10 NOTE — Discharge Summary (Signed)
Discharge Summary  Lucas Holloway XLK:[id number] DOB: [birth year]  PCP: [name], MD  Admit date: 01/07/2018 Discharge date: 01/10/2018  Time spent: 40 mins  Recommendations for Outpatient Follow-up:  1. PCP 2. Oncology  Discharge Diagnoses:  Active Hospital Problems   Diagnosis Date Noted  . Symptomatic Anemia 01/07/2018  . Malignant neoplasm metastatic to bone ([facility]) 02/09/2016  . Prostate cancer ([facility]) 01/14/2016  . Pituitary macroadenoma ([facility]) 11/04/2015    Resolved Hospital Problems  No resolved problems to display.    Discharge Condition: Stable  Diet recommendation: Heart healthy  Vitals:   01/09/18 2051 01/10/18 0513  BP: 131/73 117/63  Pulse: 87 79  Resp: 17 17  Temp: 98.5 F (36.9 C) 98.5 F (36.9 C)  SpO2: 97% 95%    History of present illness:  77 year old male with past medical history significant for castration resistant prostate cancer diagnosed in 2009, initially treated with recurrence in October 2016 with bony metastasis, currently getting Lupron shots every 4 months, admitted on 01/07/2018 with symptomatic anemia with hemoglobin of 5.9 and heme positive stools, in the setting of NSAID use. Pt admitted for further management.  Today, patient denies any new complaints. Excited to be discharged, feels much better. Discussed discharge plans in detail with wife, all questions answered.  Hospital Course:  Principal Problem:   Symptomatic Anemia Active Problems:   Pituitary macroadenoma ([facility])   Prostate cancer ([place])   Malignant neoplasm metastatic to bone ([facility])  Acute on chronic anemia of chronic disease Stable at 9.3 Likely due to LA grade D esophagitis 2/2 NSAID use Hemoglobin baseline around 11---> 5.9 on admission FOBT positive CT abdomen pelvis showed interval progression of osteoblastic mets, increased stool retention, small hiatal hernia, esophagitis Status post 2U of PRBC on 01/07/18 EGD on 01/08/2018 showed LA grade D esophagitis, no gastric  or duodenal ulcers identified, no active bleeding Start PO iron supplement upon discharge  LA grade D Esophagitis Confirmed by EGD Continue PPI indefinitely, avoid NSAIDs  Leukocytosis Resolved Afebrile, likely reactive CT abdomen pelvis as above BC x2 NGTD  Hyponatremia Chronic, at his baseline  Resistance metastatic prostate cancer with mets to the bone Follows with oncology Currently on Lupron every 4 months, last December 04, 2017  Constipation Continue laxatives   Procedures:  EGD  Consultations:  GI  Discharge Exam: BP 117/63 (BP Location: Left Arm)   Pulse 79   Temp 98.5 F (36.9 C)   Resp 17   Ht 5\' 8"  (1.727 m)   Wt 66.7 kg   SpO2 95%   BMI 22.35 kg/m   General: NAD, chronically ill appearing  Cardiovascular: S1, S2 present Respiratory: CTAB  Discharge Instructions You were cared for by a hospitalist during your hospital stay. If you have any questions about your discharge medications or the care you received while you were in the hospital after you are discharged, you can call the unit and asked to speak with the hospitalist on call if the hospitalist that took care of you is not available. Once you are discharged, your primary care physician will handle any further medical issues. Please note that NO REFILLS for any discharge medications will be authorized once you are discharged, as it is imperative that you return to your primary care physician (or establish a relationship with a primary care physician if you do not have one) for your aftercare needs so that they can reassess your need for medications and monitor your lab values.   Allergies as of 01/10/2018  No Known Allergies     Medication List    STOP taking these medications   meloxicam 7.5 MG tablet Commonly known as:  MOBIC     TAKE these medications   calcium-vitamin D 500-200 MG-UNIT tablet Commonly known as:  OSCAL WITH D Take 1 tablet by mouth 2 (two) times daily.   ferrous  sulfate 325 (65 FE) MG EC tablet Take 1 tablet (325 mg total) by mouth 2 (two) times daily.   fish oil-omega-3 fatty acids 1000 MG capsule Take 1 g by mouth daily.   folic acid 1 MG tablet Commonly known as:  FOLVITE Take 1 mg by mouth daily.   leuprolide 30 MG injection Commonly known as:  LUPRON Inject 30 mg into the muscle every 4 (four) months.   levothyroxine 75 MCG tablet Commonly known as:  SYNTHROID, LEVOTHROID Take 75 mcg by mouth daily before breakfast.   lidocaine-prilocaine cream Commonly known as:  EMLA Apply 1 application topically as needed.   multivitamin with minerals Tabs tablet Take 1 tablet by mouth daily.   pantoprazole 40 MG tablet Commonly known as:  PROTONIX Take 1 tablet (40 mg total) by mouth 2 (two) times daily.   prochlorperazine 10 MG tablet Commonly known as:  COMPAZINE TAKE 1 TABLET(10 MG) BY MOUTH EVERY 6 HOURS AS NEEDED FOR NAUSEA OR VOMITING   senna 8.6 MG Tabs tablet Commonly known as:  SENOKOT Take 1 tablet (8.6 mg total) by mouth at bedtime.   traMADol 50 MG tablet Commonly known as:  ULTRAM Take 1 tablet (50 mg total) by mouth every 6 (six) hours as needed for up to 7 days.   Vitamin D3 5000 units Caps Take 5,000 Units by mouth daily.   vitamin E 400 UNIT capsule Take 800 Units by mouth daily.      No Known Allergies Follow-up Information    [name], MD. Schedule an appointment as soon as possible for a visit in 1 week(s).   Specialty:  Family Medicine Contact information: [address] [place] [address] [place] [address] [phone]            The results of significant diagnostics from this hospitalization (including imaging, microbiology, ancillary and laboratory) are listed below for reference.    Significant Diagnostic Studies: Ct Abdomen Pelvis W Contrast  Result Date: 01/07/2018 CLINICAL DATA:  Low hemoglobin with dark stools, weakness and loss of appetite. History of prostate cancer with metastasis  to bone. EXAM: CT ABDOMEN AND PELVIS WITH CONTRAST TECHNIQUE: Multidetector CT imaging of the abdomen and pelvis was performed using the standard protocol following bolus administration of intravenous contrast. CONTRAST:  145mL ISOVUE-300 IOPAMIDOL (ISOVUE-300) INJECTION 61% COMPARISON:  None. FINDINGS: Lower chest: The included heart size is normal. Minimal coronary arteriosclerosis is noted. Slight thickening of the distal esophagus associated small hiatal hernia. Esophagitis not entirely excluded. Small bilateral pleural effusions, new since prior exam on the right. Tiny calcified density at the right lung base may represent a small granuloma. Bibasilar atelectasis is identified adjacent to the pleural effusions. Hepatobiliary: Mild hepatic steatosis. No space-occupying mass noted of the liver. Physiologically distended gallbladder without stones or wall thickening. No biliary dilatation. Pancreas: Normal Spleen: Normal Adrenals/Urinary Tract: Normal bilateral adrenal glands. Symmetric cortical enhancement of both kidneys. Nonobstructing calculus in the interpolar left kidney measuring approximately 2-3 mm. No hydroureteronephrosis. Urinary bladder is physiologically distended without focal mural thickening or calculi. Stomach/Bowel: The stomach is decompressed in appearance. There is normal small bowel rotation. Increased  colonic stool retention is noted as before consistent with constipation. Normal-appearing appendix. Vascular/Lymphatic: Aortic and branch vessel atherosclerosis without aneurysm. Ectasia of the right common iliac artery atherosclerotic calcifications as before measuring up to 1.6 cm. Reproductive: Brachytherapy seeds noted in the prostate. Other: No free air nor free fluid. Musculoskeletal: Slight interval increase in osteoblastic metastasis of the axial and appendicular skeleton since prior, example right L5 transverse process involvement since prior exam, series 2/53 versus series 2/48  previously and posterior elements at T11 for example. Index right iliac lesion is stable at 2.9 cm, series 2/65 versus series 2/60. IMPRESSION: 1. Some interval progression of osteoblastic metastasis since prior. 2. Increased stool retention consistent with constipation. No bowel obstruction or inflammation. 3. Small hiatal hernia with slight distal esophageal thickening raising suspicion for esophagitis. Electronically Signed   By: Ashley Royalty M.D.   On: 01/07/2018 19:19    Microbiology: Recent Results (from the past 240 hour(s))  Culture, blood (Routine X 2) w Reflex to ID Panel     Status: None (Preliminary result)   Collection Time: 01/08/18  3:22 PM  Result Value Ref Range Status   Specimen Description   Final    BLOOD RIGHT ANTECUBITAL Performed at [facility] [address] [place], [place] [address]    Special Requests   Final    BOTTLES DRAWN AEROBIC AND ANAEROBIC Blood Culture adequate volume Performed at [facility] [address] [place], [place] [address]    Culture   Final    NO GROWTH < 24 HOURS Performed at [facility] [address] [place], [place] [address]    Report Status PENDING  Incomplete  Culture, blood (Routine X 2) w Reflex to ID Panel     Status: None (Preliminary result)   Collection Time: 01/08/18  3:22 PM  Result Value Ref Range Status   Specimen Description   Final    BLOOD RIGHT HAND Performed at [facility] [address] [place], [place] [address]    Special Requests   Final    BOTTLES DRAWN AEROBIC ONLY Blood Culture adequate volume Performed at [facility] [address], [place], [place] [address]    Culture   Final    NO GROWTH < 24 HOURS Performed at [facility] [address], [place], [place] [address]    Report Status PENDING  Incomplete     Labs: Basic Metabolic Panel: Recent Labs  Lab 01/07/18 1404 01/07/18 1658 01/08/18 0406  01/09/18 0423  NA 121* 123* 126* 127*  K 4.8 4.8 4.1 4.4  CL 90* 91* 95* 96*  CO2 22 24 23 24   GLUCOSE 94 93 96 74  BUN 13 17 11 10   CREATININE 0.61 0.50* 0.53* 0.60*  CALCIUM 7.6* 7.5* 7.1* 7.7*   Liver Function Tests: Recent Labs  Lab 01/07/18 1404 01/07/18 1658 01/08/18 0406  AST 29 29 31   ALT 11 13 11   ALKPHOS 174* 158* 141*  BILITOT 0.3 0.5 0.6  PROT 5.1* 5.1* 4.9*  ALBUMIN 2.4* 2.5* 2.4*   No results for input(s): LIPASE, AMYLASE in the last 168 hours. No results for input(s): AMMONIA in the last 168 hours. CBC: Recent Labs  Lab 01/07/18 1404 01/07/18 1658 01/08/18 0406 01/09/18 0423 01/10/18 0459  WBC 19.3* 19.3* 18.2* 14.1* 10.0  NEUTROABS 16.2* 16.9*  --   --  7.8  HGB 5.9* 6.2* 9.3* 9.3* 9.3*  HCT 18.2* 18.7* 27.6* 27.9*  27.7*  MCV 94.3 95.4 83.6 84.3 84.2  PLT 119* 134* 104* 98* 97*   Cardiac Enzymes: No results for input(s): CKTOTAL, CKMB, CKMBINDEX, TROPONINI in the last 168 hours. BNP: BNP (last 3 results) No results for input(s): BNP in the last 8760 hours.  ProBNP (last 3 results) No results for input(s): PROBNP in the last 8760 hours.  CBG: No results for input(s): GLUCAP in the last 168 hours.     Signed:  Alma Friendly, MD [facility] 01/10/2018, 10:57 AM

## 2018-01-10 NOTE — Care Management Note (Signed)
Case Management Note  Patient Details  Name: Lucas Holloway MRN: [id number] Date of Birth: 1940-07-13  Subjective/Objective:      Discharge- to home with [facility]-pt              Action/Plan: spoke with patient and wife [facility]/text sent to representative.  Positive response received.   Expected Discharge Date:  01/10/18               Expected Discharge Plan:  [place]  In-House Referral:     Discharge planning Services  CM Consult  Post Acute Care Choice:  Home Health Choice offered to:  Patient  DME Arranged:    DME Agency:     HH Arranged:  PT [place]:  [facility]  Status of Service:  Completed, signed off  If discussed at [facility] [place] of Stay Meetings, dates discussed:    Additional Comments:  Leeroy Cha, RN 01/10/2018, 11:37 AM

## 2018-01-13 LAB — CULTURE, BLOOD (ROUTINE X 2)
CULTURE: NO GROWTH
CULTURE: NO GROWTH
Special Requests: ADEQUATE
Special Requests: ADEQUATE

## 2018-01-14 DIAGNOSIS — K210 Gastro-esophageal reflux disease with esophagitis: Secondary | ICD-10-CM | POA: Diagnosis not present

## 2018-01-14 DIAGNOSIS — C7951 Secondary malignant neoplasm of bone: Secondary | ICD-10-CM | POA: Diagnosis not present

## 2018-01-14 DIAGNOSIS — Z87891 Personal history of nicotine dependence: Secondary | ICD-10-CM | POA: Diagnosis not present

## 2018-01-14 DIAGNOSIS — Z79818 Long term (current) use of other agents affecting estrogen receptors and estrogen levels: Secondary | ICD-10-CM | POA: Diagnosis not present

## 2018-01-14 DIAGNOSIS — D638 Anemia in other chronic diseases classified elsewhere: Secondary | ICD-10-CM | POA: Diagnosis not present

## 2018-01-14 DIAGNOSIS — E871 Hypo-osmolality and hyponatremia: Secondary | ICD-10-CM | POA: Diagnosis not present

## 2018-01-14 DIAGNOSIS — K449 Diaphragmatic hernia without obstruction or gangrene: Secondary | ICD-10-CM | POA: Diagnosis not present

## 2018-01-14 DIAGNOSIS — D352 Benign neoplasm of pituitary gland: Secondary | ICD-10-CM | POA: Diagnosis not present

## 2018-01-14 DIAGNOSIS — L4052 Psoriatic arthritis mutilans: Secondary | ICD-10-CM | POA: Diagnosis not present

## 2018-01-14 DIAGNOSIS — C61 Malignant neoplasm of prostate: Secondary | ICD-10-CM | POA: Diagnosis not present

## 2018-01-15 DIAGNOSIS — C61 Malignant neoplasm of prostate: Secondary | ICD-10-CM | POA: Diagnosis not present

## 2018-01-15 DIAGNOSIS — K9289 Other specified diseases of the digestive system: Secondary | ICD-10-CM | POA: Diagnosis not present

## 2018-01-15 DIAGNOSIS — R5383 Other fatigue: Secondary | ICD-10-CM | POA: Diagnosis not present

## 2018-01-15 DIAGNOSIS — K5900 Constipation, unspecified: Secondary | ICD-10-CM | POA: Diagnosis not present

## 2018-01-15 DIAGNOSIS — D649 Anemia, unspecified: Secondary | ICD-10-CM | POA: Diagnosis not present

## 2018-01-15 DIAGNOSIS — Z6821 Body mass index (BMI) 21.0-21.9, adult: Secondary | ICD-10-CM | POA: Diagnosis not present

## 2018-01-17 ENCOUNTER — Inpatient Hospital Stay (HOSPITAL_BASED_OUTPATIENT_CLINIC_OR_DEPARTMENT_OTHER): Payer: Medicare Other | Admitting: Oncology

## 2018-01-17 ENCOUNTER — Inpatient Hospital Stay: Payer: Medicare Other

## 2018-01-17 ENCOUNTER — Telehealth: Payer: Self-pay | Admitting: Oncology

## 2018-01-17 VITALS — BP 96/57 | HR 92 | Temp 97.6°F | Resp 16 | Ht 68.0 in | Wt 141.7 lb

## 2018-01-17 DIAGNOSIS — D649 Anemia, unspecified: Secondary | ICD-10-CM | POA: Diagnosis not present

## 2018-01-17 DIAGNOSIS — C61 Malignant neoplasm of prostate: Secondary | ICD-10-CM

## 2018-01-17 DIAGNOSIS — C7951 Secondary malignant neoplasm of bone: Secondary | ICD-10-CM | POA: Diagnosis not present

## 2018-01-17 DIAGNOSIS — L4050 Arthropathic psoriasis, unspecified: Secondary | ICD-10-CM

## 2018-01-17 DIAGNOSIS — Z5111 Encounter for antineoplastic chemotherapy: Secondary | ICD-10-CM

## 2018-01-17 DIAGNOSIS — K449 Diaphragmatic hernia without obstruction or gangrene: Secondary | ICD-10-CM | POA: Diagnosis not present

## 2018-01-17 DIAGNOSIS — Z7689 Persons encountering health services in other specified circumstances: Secondary | ICD-10-CM | POA: Diagnosis not present

## 2018-01-17 DIAGNOSIS — Z192 Hormone resistant malignancy status: Secondary | ICD-10-CM | POA: Diagnosis not present

## 2018-01-17 DIAGNOSIS — Z87891 Personal history of nicotine dependence: Secondary | ICD-10-CM

## 2018-01-17 DIAGNOSIS — Z95828 Presence of other vascular implants and grafts: Secondary | ICD-10-CM

## 2018-01-17 LAB — CMP (CANCER CENTER ONLY)
ALBUMIN: 2.6 g/dL — AB (ref 3.5–5.0)
ALK PHOS: 164 U/L — AB (ref 38–126)
ALT: 11 U/L (ref 0–44)
ANION GAP: 10 (ref 5–15)
AST: 27 U/L (ref 15–41)
BUN: 11 mg/dL (ref 8–23)
CALCIUM: 8.1 mg/dL — AB (ref 8.9–10.3)
CHLORIDE: 92 mmol/L — AB (ref 98–111)
CO2: 23 mmol/L (ref 22–32)
Creatinine: 0.64 mg/dL (ref 0.61–1.24)
GFR, Estimated: >60 mL/min (ref >60)
GLUCOSE: 122 mg/dL — AB (ref 70–99)
Potassium: 4.2 mmol/L (ref 3.5–5.1)
SODIUM: 125 mmol/L — AB (ref 135–145)
Total Bilirubin: 0.4 mg/dL (ref 0.3–1.2)
Total Protein: 5.8 g/dL — ABNORMAL LOW (ref 6.5–8.1)

## 2018-01-17 LAB — CBC WITH DIFFERENTIAL (CANCER CENTER ONLY)
Basophils Absolute: 0 10*3/uL (ref 0.0–0.1)
Basophils Relative: 1 %
Eosinophils Absolute: 0.2 10*3/uL (ref 0.0–0.5)
Eosinophils Relative: 2 %
HCT: 28.7 % — ABNORMAL LOW (ref 38.4–49.9)
HEMOGLOBIN: 9.5 g/dL — AB (ref 13.0–17.1)
LYMPHS ABS: 1.1 10*3/uL (ref 0.9–3.3)
LYMPHS PCT: 16 %
MCH: 28.3 pg (ref 27.2–33.4)
MCHC: 33.1 g/dL (ref 32.0–36.0)
MCV: 85.4 fL (ref 79.3–98.0)
MONO ABS: 0.9 10*3/uL (ref 0.1–0.9)
MONOS PCT: 12 %
NEUTROS ABS: 5 10*3/uL (ref 1.5–6.5)
NEUTROS PCT: 69 %
Platelet Count: 104 10*3/uL — ABNORMAL LOW (ref 140–400)
RBC: 3.36 MIL/uL — ABNORMAL LOW (ref 4.20–5.82)
RDW: 27.6 % — AB (ref 11.0–14.6)
WBC Count: 7.2 10*3/uL (ref 4.0–10.3)

## 2018-01-17 MED ORDER — SODIUM CHLORIDE 0.9% FLUSH
10.0000 mL | INTRAVENOUS | Status: DC | PRN
  Administered 2018-01-17: 10 mL via INTRAVENOUS
  Filled 2018-01-17: qty 10

## 2018-01-17 MED ORDER — SODIUM CHLORIDE 0.9% FLUSH
10.0000 mL | INTRAVENOUS | Status: DC | PRN
  Administered 2018-01-17: 10 mL
  Filled 2018-01-17: qty 10

## 2018-01-17 MED ORDER — SODIUM CHLORIDE 0.9 % IV SOLN
Freq: Once | INTRAVENOUS | Status: AC
  Administered 2018-01-17: 10:00:00 via INTRAVENOUS
  Filled 2018-01-17: qty 250

## 2018-01-17 MED ORDER — DEXAMETHASONE SODIUM PHOSPHATE 10 MG/ML IJ SOLN
INTRAMUSCULAR | Status: AC
  Filled 2018-01-17: qty 1

## 2018-01-17 MED ORDER — DOCETAXEL CHEMO INJECTION 160 MG/16ML
60.0000 mg/m2 | Freq: Once | INTRAVENOUS | Status: AC
  Administered 2018-01-17: 110 mg via INTRAVENOUS
  Filled 2018-01-17: qty 11

## 2018-01-17 MED ORDER — HEPARIN SOD (PORK) LOCK FLUSH 100 UNIT/ML IV SOLN
500.0000 [IU] | Freq: Once | INTRAVENOUS | Status: AC | PRN
  Administered 2018-01-17: 500 [IU]
  Filled 2018-01-17: qty 5

## 2018-01-17 MED ORDER — DEXAMETHASONE SODIUM PHOSPHATE 10 MG/ML IJ SOLN
10.0000 mg | Freq: Once | INTRAMUSCULAR | Status: AC
  Administered 2018-01-17: 10 mg via INTRAVENOUS

## 2018-01-17 MED ORDER — HEPARIN SOD (PORK) LOCK FLUSH 100 UNIT/ML IV SOLN
500.0000 [IU] | Freq: Once | INTRAVENOUS | Status: DC
  Filled 2018-01-17: qty 5

## 2018-01-17 NOTE — Patient Instructions (Signed)
[facility] Discharge Instructions for Patients Receiving Chemotherapy  Today you received the following chemotherapy agents: Taxotere  To help prevent nausea and vomiting after your treatment, we encourage you to take your nausea medication as directed.    If you develop nausea and vomiting that is not controlled by your nausea medication, call the clinic.   BELOW ARE SYMPTOMS THAT SHOULD BE REPORTED IMMEDIATELY:  *FEVER GREATER THAN 100.5 F  *CHILLS WITH OR WITHOUT FEVER  NAUSEA AND VOMITING THAT IS NOT CONTROLLED WITH YOUR NAUSEA MEDICATION  *UNUSUAL SHORTNESS OF BREATH  *UNUSUAL BRUISING OR BLEEDING  TENDERNESS IN MOUTH AND THROAT WITH OR WITHOUT PRESENCE OF ULCERS  *URINARY PROBLEMS  *BOWEL PROBLEMS  UNUSUAL RASH Items with * indicate a potential emergency and should be followed up as soon as possible.  Feel free to call the clinic should you have any questions or concerns. The clinic phone number is [phone].  Please show the CHEMO ALERT CARD at check-in to the Emergency Department and triage nurse.   

## 2018-01-17 NOTE — Telephone Encounter (Signed)
Gave patient avs and calendar of upcoming appts.  °

## 2018-01-17 NOTE — Telephone Encounter (Signed)
Appointment moved and patient notified per 8/15 sch msg

## 2018-01-17 NOTE — Progress Notes (Signed)
Hematology and Oncology Follow Up Visit  Lucas Holloway [id number] Jun 09, 1940 77 y.o. 01/17/2018 8:49 AM Lucas Holloway, MDDewey, [name], MD   Principle Diagnosis: 77 year old man with castration-resistant prostate cancer with documented bony metastasis diagnosed in 2017.  He was diagnosed with prostate cancer in 2009 and found to have Gleason score 6, PSA of 9.   Prior Therapy: He was treated with brachytherapy with excellent PSA response initially. His PSA nadir was 0.7 and 2013.   He developed metastases in October 2016 with bone metastasis. He was started on androgen deprivation under the care of Dr. Jeffie Holloway and his PSA did drop down initially to 0.61 in May 2017. He developed castration resistant disease in 2017. Zytiga 1000 mg daily with prednisone 5 mg daily started on 01/07/2016. Therapy discontinued and 2018 because of progression of disease. Xtandi 160 mg daily started in August 2018. The dose was reduced to 80 mg daily starting on 02/12/2017 for better tolerance.  Therapy discontinued in October 2018 because of poor tolerance and progression of disease. Xofigo infusion on a monthly basis started on May 04, 2017.  He completed 6 treatments in April 2019.  Current therapy:   Taxotere chemotherapy started on December 04, 2017.  He is here for cycle 3 of therapy.  He is receiving Xgeva every 8 weeks.  This is been on hold because of dental procedures.  He is currently on Lupron at 30 mg every 4 months.  Last injection was given on December 04, 2017.  Interim History: Lucas Holloway presents today for a follow-up visit.  Since the last visit, he was hospitalized after January 10, 2018 after presenting with worsening anemia and GI bleed.  EGD was performed and showed erosive gastritis related to excessive NSAID use.  He received packed red cell transfusion with improvement in his hemoglobin.  Since his discharge, he is feeling better with improvement in his performance status and activity level.  He  is still quite debilitated and limited in his mobility but has not reported any hematochezia or melena.  He denies any complications related to chemotherapy including nausea, vomiting or infusion related complications.  His appetite still sporadic and have lost more weight.  He is eating better however because of his improvement in his dental work.  He does not report any headaches, blurry vision, syncope. He does not report any fevers or chills or sweats. He does not report any cough, wheezing or hemoptysis. He does not report any chest pain, palpitation orthopnea. He does not report any nausea, vomiting or abdominal pain.  He denies any change in his bowel habits..  He does not report any frequency urgency or hesitancy. He does not report any pain is in joints or deformity.  He does not report any lymphadenopathy or petechiae.  Remaining review of systems is negative.  Medications: I have reviewed the patient's current medications.  Current Outpatient Medications  Medication Sig Dispense Refill  . calcium-vitamin D (OSCAL WITH D) 500-200 MG-UNIT per tablet Take 1 tablet by mouth 2 (two) times daily.    . Cholecalciferol (VITAMIN D3) 5000 units CAPS Take 5,000 Units by mouth daily.    . ferrous sulfate 325 (65 FE) MG EC tablet Take 1 tablet (325 mg total) by mouth 2 (two) times daily. 60 tablet 0  . fish oil-omega-3 fatty acids 1000 MG capsule Take 1 g by mouth daily.     . folic acid (FOLVITE) 1 MG tablet Take 1 mg by mouth daily.    Marland Kitchen  leuprolide (LUPRON) 30 MG injection Inject 30 mg into the muscle every 4 (four) months.    . levothyroxine (SYNTHROID, LEVOTHROID) 75 MCG tablet Take 75 mcg by mouth daily before breakfast.   3  . lidocaine-prilocaine (EMLA) cream Apply 1 application topically as needed. 30 g 0  . Multiple Vitamin (MULTIVITAMIN WITH MINERALS) TABS Take 1 tablet by mouth daily.    . pantoprazole (PROTONIX) 40 MG tablet Take 1 tablet (40 mg total) by mouth 2 (two) times daily. 60  tablet 0  . prochlorperazine (COMPAZINE) 10 MG tablet TAKE 1 TABLET(10 MG) BY MOUTH EVERY 6 HOURS AS NEEDED FOR NAUSEA OR VOMITING 385 tablet 0  . senna (SENOKOT) 8.6 MG TABS tablet Take 1 tablet (8.6 mg total) by mouth at bedtime. 30 tablet 0  . traMADol (ULTRAM) 50 MG tablet Take 1 tablet (50 mg total) by mouth every 6 (six) hours as needed for up to 7 days. 28 tablet 0  . vitamin E 400 UNIT capsule Take 800 Units by mouth daily.      No current facility-administered medications for this visit.    Facility-Administered Medications Ordered in Other Visits  Medication Dose Route Frequency Provider Last Rate Last Dose  . heparin lock flush 100 unit/mL  500 Units Intravenous Once Shadad, Firas N, MD      . sodium chloride flush (NS) 0.9 % injection 10 mL  10 mL Intravenous PRN Wyatt Portela, MD   10 mL at 01/17/18 2119     Allergies: No Known Allergies  Past Medical History, Surgical history, Social history, and Family History reviewed today and remain unchanged.  Physical Exam: Blood pressure (!) 96/57, pulse 92, temperature 97.6 F (36.4 C), temperature source Oral, resp. rate 16, height 5\' 8"  (1.727 m), weight 141 lb 11.2 oz (64.3 kg), SpO2 99 %.     ECOG: 1 General appearance: Chronically ill-appearing gentleman without distress today. Head: Normocephalic without abnormalities. Oropharynx: No oral thrush or ulcers. Eyes: Sclera anicteric. Lymph nodes: No cervical, supraclavicular, or axillary lymphadenopathy. Heart: Regular rate and rhythm without any murmurs.  S1 and S2. Lung: Clear without any rhonchi, wheezes or dullness to percussion. Abdomin: Soft, without any rebound or guarding.  No shifting dullness or ascites. Musculoskeletal: No joint deformity or effusion. Neurological: Intact, motor, sensory and deep tendon reflexes. Psychiatric: His mood is appropriate but his affect is flat.  Lab Results: Lab Results  Component Value Date   WBC 10.0 01/10/2018   HGB 9.3  (L) 01/10/2018   HCT 27.7 (L) 01/10/2018   MCV 84.2 01/10/2018   PLT 97 (L) 01/10/2018     Chemistry      Component Value Date/Time   NA 127 (L) 01/09/2018 0423   NA 125 (L) 06/01/2017 1406   K 4.4 01/09/2018 0423   K 4.6 06/01/2017 1406   CL 96 (L) 01/09/2018 0423   CO2 24 01/09/2018 0423   CO2 23 06/01/2017 1406   BUN 10 01/09/2018 0423   BUN 20.6 06/01/2017 1406   CREATININE 0.60 (L) 01/09/2018 0423   CREATININE 0.61 01/07/2018 1404   CREATININE 0.7 06/01/2017 1406      Component Value Date/Time   CALCIUM 7.7 (L) 01/09/2018 0423   CALCIUM 9.1 06/01/2017 1406   ALKPHOS 141 (H) 01/08/2018 0406   ALKPHOS 94 06/01/2017 1406   AST 31 01/08/2018 0406   AST 29 01/07/2018 1404   AST 29 06/01/2017 1406   ALT 11 01/08/2018 0406   ALT 11 01/07/2018 1404  ALT 20 06/01/2017 1406   BILITOT 0.6 01/08/2018 0406   BILITOT 0.3 01/07/2018 1404   BILITOT 0.40 06/01/2017 1406       Impression and Plan:  77 year old man with:   1. Castration-resistant prostate cancer with disease to the bone documented in 2017.  He is status post 2 cycles of Taxotere chemotherapy without any major complications.  Risks and benefits of continuing chemotherapy and long-term complications were reviewed.  The plan is to continue with the same dose and schedule.  His PSA did increase to 179 after the first cycle of chemotherapy but anticipate improvement in the next few cycles.  2. Bone directed therapy: Delton See remains on hold for the time being based on his wishes.  This will be resumed in the future.  3. Androgen depravation: He remains on Lupron and his last injection was in July and will be repeated in November.  4.  Anorexia: His appetite remains poor and his dentures are in place and anticipate improvement in the near future.  We have discussed strategies to improve his nutritional intake including nutritional supplements.  5.  IV access: Port-A-Cath remains in use without complications.  6.   Antiemetics: No issues with nausea or vomiting reported.  7.  Growth factor support: He will receive growth factor after each cycle of chemotherapy.  No neutropenia noted.  8.  Prognosis and goals of care: The goal of therapy remains palliative although his performance status is adequate and aggressive therapy is warranted.  9.  Anemia: Related to GI bleeding which has stopped at this time.  He is on oral iron supplements and will receive intravenous iron if needed.  I will recheck his iron studies with the next visit.  10. Follow-up: In 3 weeks for the next cycle of chemotherapy.  25  minutes was spent with the patient face-to-face today.  More than 50% of time was dedicated to discussing the natural course of this disease as well as future treatment options and management strategies for his cancer.     Zola Button, MD 8/15/20198:49 AM

## 2018-01-18 ENCOUNTER — Inpatient Hospital Stay: Payer: Medicare Other

## 2018-01-18 ENCOUNTER — Telehealth: Payer: Self-pay | Admitting: *Deleted

## 2018-01-18 VITALS — BP 120/61 | HR 92 | Resp 18

## 2018-01-18 DIAGNOSIS — D649 Anemia, unspecified: Secondary | ICD-10-CM | POA: Diagnosis not present

## 2018-01-18 DIAGNOSIS — K449 Diaphragmatic hernia without obstruction or gangrene: Secondary | ICD-10-CM | POA: Diagnosis not present

## 2018-01-18 DIAGNOSIS — C61 Malignant neoplasm of prostate: Secondary | ICD-10-CM | POA: Diagnosis not present

## 2018-01-18 DIAGNOSIS — L4050 Arthropathic psoriasis, unspecified: Secondary | ICD-10-CM | POA: Diagnosis not present

## 2018-01-18 DIAGNOSIS — C7951 Secondary malignant neoplasm of bone: Secondary | ICD-10-CM

## 2018-01-18 DIAGNOSIS — Z7689 Persons encountering health services in other specified circumstances: Secondary | ICD-10-CM | POA: Diagnosis not present

## 2018-01-18 LAB — PROSTATE-SPECIFIC AG, SERUM (LABCORP): Prostate Specific Ag, Serum: 200 ng/mL — ABNORMAL HIGH (ref 0.0–4.0)

## 2018-01-18 MED ORDER — PEGFILGRASTIM-CBQV 6 MG/0.6ML ~~LOC~~ SOSY
PREFILLED_SYRINGE | SUBCUTANEOUS | Status: AC
  Filled 2018-01-18: qty 0.6

## 2018-01-18 MED ORDER — PEGFILGRASTIM-CBQV 6 MG/0.6ML ~~LOC~~ SOSY
6.0000 mg | PREFILLED_SYRINGE | Freq: Once | SUBCUTANEOUS | Status: AC
  Administered 2018-01-18: 6 mg via SUBCUTANEOUS

## 2018-01-18 MED ORDER — GOSERELIN ACETATE 3.6 MG ~~LOC~~ IMPL
DRUG_IMPLANT | SUBCUTANEOUS | Status: AC
  Filled 2018-01-18: qty 3.6

## 2018-01-18 NOTE — Telephone Encounter (Signed)
-----   Message from Wyatt Portela, MD sent at 01/18/2018  8:10 AM EDT ----- Please let him know his PSA is slightly up. No change in treatment needed at this time.

## 2018-01-18 NOTE — Telephone Encounter (Signed)
Spoke with patient, gave results of last PSA 

## 2018-01-22 DIAGNOSIS — C7951 Secondary malignant neoplasm of bone: Secondary | ICD-10-CM | POA: Diagnosis not present

## 2018-01-22 DIAGNOSIS — D638 Anemia in other chronic diseases classified elsewhere: Secondary | ICD-10-CM | POA: Diagnosis not present

## 2018-01-22 DIAGNOSIS — C61 Malignant neoplasm of prostate: Secondary | ICD-10-CM | POA: Diagnosis not present

## 2018-01-22 DIAGNOSIS — K449 Diaphragmatic hernia without obstruction or gangrene: Secondary | ICD-10-CM | POA: Diagnosis not present

## 2018-01-22 DIAGNOSIS — D352 Benign neoplasm of pituitary gland: Secondary | ICD-10-CM | POA: Diagnosis not present

## 2018-01-22 DIAGNOSIS — K210 Gastro-esophageal reflux disease with esophagitis: Secondary | ICD-10-CM | POA: Diagnosis not present

## 2018-01-25 DIAGNOSIS — K210 Gastro-esophageal reflux disease with esophagitis: Secondary | ICD-10-CM | POA: Diagnosis not present

## 2018-01-25 DIAGNOSIS — K9289 Other specified diseases of the digestive system: Secondary | ICD-10-CM | POA: Diagnosis not present

## 2018-01-25 DIAGNOSIS — Z23 Encounter for immunization: Secondary | ICD-10-CM | POA: Diagnosis not present

## 2018-01-25 DIAGNOSIS — E559 Vitamin D deficiency, unspecified: Secondary | ICD-10-CM | POA: Diagnosis not present

## 2018-01-25 DIAGNOSIS — C61 Malignant neoplasm of prostate: Secondary | ICD-10-CM | POA: Diagnosis not present

## 2018-01-25 DIAGNOSIS — E46 Unspecified protein-calorie malnutrition: Secondary | ICD-10-CM | POA: Diagnosis not present

## 2018-01-25 DIAGNOSIS — E782 Mixed hyperlipidemia: Secondary | ICD-10-CM | POA: Diagnosis not present

## 2018-01-25 DIAGNOSIS — Z6822 Body mass index (BMI) 22.0-22.9, adult: Secondary | ICD-10-CM | POA: Diagnosis not present

## 2018-01-25 DIAGNOSIS — E039 Hypothyroidism, unspecified: Secondary | ICD-10-CM | POA: Diagnosis not present

## 2018-01-25 DIAGNOSIS — E871 Hypo-osmolality and hyponatremia: Secondary | ICD-10-CM | POA: Diagnosis not present

## 2018-01-25 DIAGNOSIS — C7951 Secondary malignant neoplasm of bone: Secondary | ICD-10-CM | POA: Diagnosis not present

## 2018-01-25 DIAGNOSIS — K449 Diaphragmatic hernia without obstruction or gangrene: Secondary | ICD-10-CM | POA: Diagnosis not present

## 2018-01-25 DIAGNOSIS — R5383 Other fatigue: Secondary | ICD-10-CM | POA: Diagnosis not present

## 2018-01-25 DIAGNOSIS — D638 Anemia in other chronic diseases classified elsewhere: Secondary | ICD-10-CM | POA: Diagnosis not present

## 2018-01-25 DIAGNOSIS — D352 Benign neoplasm of pituitary gland: Secondary | ICD-10-CM | POA: Diagnosis not present

## 2018-01-25 DIAGNOSIS — D649 Anemia, unspecified: Secondary | ICD-10-CM | POA: Diagnosis not present

## 2018-01-29 DIAGNOSIS — D352 Benign neoplasm of pituitary gland: Secondary | ICD-10-CM | POA: Diagnosis not present

## 2018-01-29 DIAGNOSIS — D638 Anemia in other chronic diseases classified elsewhere: Secondary | ICD-10-CM | POA: Diagnosis not present

## 2018-01-29 DIAGNOSIS — C61 Malignant neoplasm of prostate: Secondary | ICD-10-CM | POA: Diagnosis not present

## 2018-01-29 DIAGNOSIS — K210 Gastro-esophageal reflux disease with esophagitis: Secondary | ICD-10-CM | POA: Diagnosis not present

## 2018-01-29 DIAGNOSIS — C7951 Secondary malignant neoplasm of bone: Secondary | ICD-10-CM | POA: Diagnosis not present

## 2018-01-29 DIAGNOSIS — K449 Diaphragmatic hernia without obstruction or gangrene: Secondary | ICD-10-CM | POA: Diagnosis not present

## 2018-02-01 ENCOUNTER — Other Ambulatory Visit: Payer: Self-pay

## 2018-02-01 ENCOUNTER — Inpatient Hospital Stay (HOSPITAL_COMMUNITY)
Admission: EM | Admit: 2018-02-01 | Discharge: 2018-02-03 | DRG: 641 | Disposition: A | Discharge status: Home / Self Care | Payer: Medicare Other | Attending: Family Medicine | Admitting: Family Medicine

## 2018-02-01 ENCOUNTER — Encounter (HOSPITAL_COMMUNITY): Payer: Self-pay | Admitting: Nurse Practitioner

## 2018-02-01 DIAGNOSIS — E871 Hypo-osmolality and hyponatremia: Principal | ICD-10-CM | POA: Diagnosis present

## 2018-02-01 DIAGNOSIS — Z993 Dependence on wheelchair: Secondary | ICD-10-CM

## 2018-02-01 DIAGNOSIS — Z8249 Family history of ischemic heart disease and other diseases of the circulatory system: Secondary | ICD-10-CM | POA: Diagnosis not present

## 2018-02-01 DIAGNOSIS — D6481 Anemia due to antineoplastic chemotherapy: Secondary | ICD-10-CM | POA: Diagnosis present

## 2018-02-01 DIAGNOSIS — K209 Esophagitis, unspecified: Secondary | ICD-10-CM | POA: Diagnosis not present

## 2018-02-01 DIAGNOSIS — R634 Abnormal weight loss: Secondary | ICD-10-CM | POA: Diagnosis not present

## 2018-02-01 DIAGNOSIS — R1319 Other dysphagia: Secondary | ICD-10-CM | POA: Diagnosis not present

## 2018-02-01 DIAGNOSIS — C61 Malignant neoplasm of prostate: Secondary | ICD-10-CM | POA: Diagnosis not present

## 2018-02-01 DIAGNOSIS — Z87891 Personal history of nicotine dependence: Secondary | ICD-10-CM

## 2018-02-01 DIAGNOSIS — Z79899 Other long term (current) drug therapy: Secondary | ICD-10-CM

## 2018-02-01 DIAGNOSIS — R627 Adult failure to thrive: Secondary | ICD-10-CM | POA: Diagnosis present

## 2018-02-01 DIAGNOSIS — H6123 Impacted cerumen, bilateral: Secondary | ICD-10-CM | POA: Diagnosis not present

## 2018-02-01 DIAGNOSIS — Z79890 Hormone replacement therapy (postmenopausal): Secondary | ICD-10-CM

## 2018-02-01 DIAGNOSIS — Z66 Do not resuscitate: Secondary | ICD-10-CM | POA: Diagnosis not present

## 2018-02-01 DIAGNOSIS — L4059 Other psoriatic arthropathy: Secondary | ICD-10-CM | POA: Diagnosis present

## 2018-02-01 DIAGNOSIS — E039 Hypothyroidism, unspecified: Secondary | ICD-10-CM | POA: Diagnosis present

## 2018-02-01 DIAGNOSIS — Z836 Family history of other diseases of the respiratory system: Secondary | ICD-10-CM | POA: Diagnosis not present

## 2018-02-01 DIAGNOSIS — D630 Anemia in neoplastic disease: Secondary | ICD-10-CM | POA: Diagnosis present

## 2018-02-01 DIAGNOSIS — D500 Iron deficiency anemia secondary to blood loss (chronic): Secondary | ICD-10-CM | POA: Diagnosis present

## 2018-02-01 DIAGNOSIS — R64 Cachexia: Secondary | ICD-10-CM | POA: Diagnosis present

## 2018-02-01 DIAGNOSIS — Z8041 Family history of malignant neoplasm of ovary: Secondary | ICD-10-CM

## 2018-02-01 DIAGNOSIS — K210 Gastro-esophageal reflux disease with esophagitis: Secondary | ICD-10-CM | POA: Diagnosis not present

## 2018-02-01 DIAGNOSIS — Z6821 Body mass index (BMI) 21.0-21.9, adult: Secondary | ICD-10-CM

## 2018-02-01 DIAGNOSIS — Z192 Hormone resistant malignancy status: Secondary | ICD-10-CM

## 2018-02-01 DIAGNOSIS — D352 Benign neoplasm of pituitary gland: Secondary | ICD-10-CM | POA: Diagnosis not present

## 2018-02-01 DIAGNOSIS — D649 Anemia, unspecified: Secondary | ICD-10-CM | POA: Diagnosis not present

## 2018-02-01 DIAGNOSIS — K449 Diaphragmatic hernia without obstruction or gangrene: Secondary | ICD-10-CM | POA: Diagnosis not present

## 2018-02-01 DIAGNOSIS — Z972 Presence of dental prosthetic device (complete) (partial): Secondary | ICD-10-CM

## 2018-02-01 DIAGNOSIS — E785 Hyperlipidemia, unspecified: Secondary | ICD-10-CM | POA: Diagnosis present

## 2018-02-01 DIAGNOSIS — E861 Hypovolemia: Secondary | ICD-10-CM | POA: Diagnosis present

## 2018-02-01 DIAGNOSIS — C7951 Secondary malignant neoplasm of bone: Secondary | ICD-10-CM | POA: Diagnosis present

## 2018-02-01 DIAGNOSIS — R5383 Other fatigue: Secondary | ICD-10-CM

## 2018-02-01 DIAGNOSIS — E440 Moderate protein-calorie malnutrition: Secondary | ICD-10-CM

## 2018-02-01 DIAGNOSIS — D638 Anemia in other chronic diseases classified elsewhere: Secondary | ICD-10-CM | POA: Diagnosis not present

## 2018-02-01 DIAGNOSIS — C799 Secondary malignant neoplasm of unspecified site: Secondary | ICD-10-CM | POA: Diagnosis not present

## 2018-02-01 DIAGNOSIS — L03115 Cellulitis of right lower limb: Secondary | ICD-10-CM | POA: Diagnosis not present

## 2018-02-01 LAB — CBC
HCT: 24.8 % — ABNORMAL LOW (ref 39.0–52.0)
HEMOGLOBIN: 8.1 g/dL — AB (ref 13.0–17.0)
MCH: 28.6 pg (ref 26.0–34.0)
MCHC: 32.7 g/dL (ref 30.0–36.0)
MCV: 87.6 fL (ref 78.0–100.0)
PLATELETS: 130 10*3/uL — AB (ref 150–400)
RBC: 2.83 MIL/uL — AB (ref 4.22–5.81)
RDW: 26.9 % — ABNORMAL HIGH (ref 11.5–15.5)
WBC: 10.6 10*3/uL — ABNORMAL HIGH (ref 4.0–10.5)

## 2018-02-01 LAB — COMPREHENSIVE METABOLIC PANEL
ALK PHOS: 130 U/L — AB (ref 38–126)
ALT: 14 U/L (ref 0–44)
ANION GAP: 10 (ref 5–15)
AST: 30 U/L (ref 15–41)
Albumin: 2.7 g/dL — ABNORMAL LOW (ref 3.5–5.0)
BILIRUBIN TOTAL: 0.3 mg/dL (ref 0.3–1.2)
BUN: 12 mg/dL (ref 8–23)
CALCIUM: 8.1 mg/dL — AB (ref 8.9–10.3)
CO2: 23 mmol/L (ref 22–32)
Chloride: 87 mmol/L — ABNORMAL LOW (ref 98–111)
Creatinine, Ser: 0.56 mg/dL — ABNORMAL LOW (ref 0.61–1.24)
Glucose, Bld: 116 mg/dL — ABNORMAL HIGH (ref 70–99)
Potassium: 4.5 mmol/L (ref 3.5–5.1)
Sodium: 120 mmol/L — ABNORMAL LOW (ref 135–145)
TOTAL PROTEIN: 5.7 g/dL — AB (ref 6.5–8.1)

## 2018-02-01 LAB — CBC WITH DIFFERENTIAL/PLATELET
BAND NEUTROPHILS: 7 %
BASOS ABS: 0 10*3/uL (ref 0.0–0.1)
Basophils Relative: 0 %
Eosinophils Absolute: 0 10*3/uL (ref 0.0–0.7)
Eosinophils Relative: 0 %
HEMATOCRIT: 25.4 % — AB (ref 39.0–52.0)
Hemoglobin: 8.8 g/dL — ABNORMAL LOW (ref 13.0–17.0)
LYMPHS ABS: 1 10*3/uL (ref 0.7–4.0)
LYMPHS PCT: 9 %
MCH: 29 pg (ref 26.0–34.0)
MCHC: 34.6 g/dL (ref 30.0–36.0)
MCV: 83.8 fL (ref 78.0–100.0)
MONO ABS: 0.6 10*3/uL (ref 0.1–1.0)
Metamyelocytes Relative: 2 %
Monocytes Relative: 5 %
Neutro Abs: 9.4 10*3/uL — ABNORMAL HIGH (ref 1.7–7.7)
Neutrophils Relative %: 77 %
Platelets: 154 10*3/uL (ref 150–400)
RBC: 3.03 MIL/uL — AB (ref 4.22–5.81)
RDW: 27.1 % — AB (ref 11.5–15.5)
WBC: 11 10*3/uL — AB (ref 4.0–10.5)

## 2018-02-01 LAB — OSMOLALITY, URINE: Osmolality, Ur: 400 mOsm/kg (ref 300–900)

## 2018-02-01 LAB — PROTIME-INR
INR: 1.04
Prothrombin Time: 13.5 seconds (ref 11.4–15.2)

## 2018-02-01 LAB — SODIUM, URINE, RANDOM: SODIUM UR: 12 mmol/L

## 2018-02-01 LAB — CREATININE, SERUM
CREATININE: 0.51 mg/dL — AB (ref 0.61–1.24)
GFR calc Af Amer: >60 mL/min (ref >60)

## 2018-02-01 LAB — URINALYSIS, ROUTINE W REFLEX MICROSCOPIC
BILIRUBIN URINE: NEGATIVE
GLUCOSE, UA: NEGATIVE mg/dL
Hgb urine dipstick: NEGATIVE
Ketones, ur: NEGATIVE mg/dL
Leukocytes, UA: NEGATIVE
Nitrite: NEGATIVE
PH: 6 (ref 5.0–8.0)
Protein, ur: NEGATIVE mg/dL
SPECIFIC GRAVITY, URINE: 1.013 (ref 1.005–1.030)

## 2018-02-01 LAB — AMMONIA: Ammonia: 17 umol/L (ref 9–35)

## 2018-02-01 LAB — MAGNESIUM: MAGNESIUM: 1.9 mg/dL (ref 1.7–2.4)

## 2018-02-01 LAB — TYPE AND SCREEN
ABO/RH(D): O POS
ANTIBODY SCREEN: NEGATIVE

## 2018-02-01 LAB — POC OCCULT BLOOD, ED: FECAL OCCULT BLD: NEGATIVE

## 2018-02-01 LAB — TSH: TSH: 1.483 u[IU]/mL (ref 0.350–4.500)

## 2018-02-01 LAB — LIPASE, BLOOD: Lipase: 26 U/L (ref 11–51)

## 2018-02-01 MED ORDER — SODIUM CHLORIDE 0.9 % IV SOLN
Freq: Once | INTRAVENOUS | Status: AC
  Administered 2018-02-01: 18:00:00 via INTRAVENOUS

## 2018-02-01 MED ORDER — PROCHLORPERAZINE MALEATE 5 MG PO TABS
5.0000 mg | ORAL_TABLET | Freq: Four times a day (QID) | ORAL | Status: DC | PRN
  Filled 2018-02-01: qty 1

## 2018-02-01 MED ORDER — FOLIC ACID 1 MG PO TABS
1.0000 mg | ORAL_TABLET | Freq: Every day | ORAL | Status: DC
  Administered 2018-02-01 – 2018-02-02 (×2): 1 mg via ORAL
  Filled 2018-02-01 (×2): qty 1

## 2018-02-01 MED ORDER — ENOXAPARIN SODIUM 40 MG/0.4ML ~~LOC~~ SOLN
40.0000 mg | SUBCUTANEOUS | Status: DC
  Administered 2018-02-01 – 2018-02-02 (×2): 40 mg via SUBCUTANEOUS
  Filled 2018-02-01 (×2): qty 0.4

## 2018-02-01 MED ORDER — SODIUM CHLORIDE 0.9% FLUSH
10.0000 mL | INTRAVENOUS | Status: DC | PRN
  Administered 2018-02-03: 10 mL
  Filled 2018-02-01: qty 40

## 2018-02-01 MED ORDER — PANTOPRAZOLE SODIUM 40 MG PO TBEC
40.0000 mg | DELAYED_RELEASE_TABLET | Freq: Two times a day (BID) | ORAL | Status: DC
  Administered 2018-02-01 – 2018-02-02 (×3): 40 mg via ORAL
  Filled 2018-02-01 (×3): qty 1

## 2018-02-01 MED ORDER — LEVOTHYROXINE SODIUM 75 MCG PO TABS
75.0000 ug | ORAL_TABLET | Freq: Every day | ORAL | Status: DC
  Administered 2018-02-02 – 2018-02-03 (×2): 75 ug via ORAL
  Filled 2018-02-01 (×2): qty 1

## 2018-02-01 MED ORDER — FERROUS SULFATE 325 (65 FE) MG PO TABS
325.0000 mg | ORAL_TABLET | Freq: Two times a day (BID) | ORAL | Status: DC
  Administered 2018-02-01 – 2018-02-02 (×3): 325 mg via ORAL
  Filled 2018-02-01 (×6): qty 1

## 2018-02-01 NOTE — ED Triage Notes (Signed)
Pt reports that he was sent by his PCP for further evaluation of abnormal labs, LFTS and Hgb. He has no particular complaints at this time, he is going chemo treatments and endorses some degree of weakness and fatigue.

## 2018-02-01 NOTE — ED Provider Notes (Signed)
[facility] DEPT Provider Note   CSN: [id number] Arrival date & time: 02/01/18  1500     History   Chief Complaint Chief Complaint  Patient presents with  . Abnormal Labs  . CA Pt    HPI Derrill Bagnell is a 77 y.o. male.  The history is provided by the patient.  Weakness  Primary symptoms include no focal weakness, no loss of sensation, no loss of balance, no speech change, no memory loss, no movement disorder, no visual change, no auditory change, and no dizziness. Primary symptoms comment: general fatigue. This is a recurrent problem. The current episode started more than 2 days ago. The problem has been gradually worsening. There was no focality noted. There has been no fever. The fever has been present for less than 1 day. Pertinent negatives include no shortness of breath, no chest pain, no vomiting, no altered mental status, no confusion and no headaches. There were no medications administered prior to arrival. Associated medical issues do not include a bleeding disorder, CVA or a clotting disorder. Associated medical issues comments: Prostate cancer, recent admission for low hemoglobin and GI workup. Patient on chemotherapy. Continues to feel generaly weak. .    Past Medical History:  Diagnosis Date  . Polyarticular psoriatic arthritis ([place])   . Prostate cancer [facility])     Patient Active Problem List   Diagnosis Date Noted  . Hyponatremia 02/01/2018  . Symptomatic Anemia 01/07/2018  . Goals of care, counseling/discussion 11/23/2017  . Malignant neoplasm metastatic to bone ([place]) 02/09/2016  . Prostate cancer ([place]) 01/14/2016  . Pituitary macroadenoma ([place]) 11/04/2015    Past Surgical History:  Procedure Laterality Date  . ESOPHAGOGASTRODUODENOSCOPY Left 01/08/2018   Procedure: ESOPHAGOGASTRODUODENOSCOPY (EGD);  Surgeon: Carol Ada, MD;  Location: [facility];  Service: Endoscopy;  Laterality: Left;  . IR IMAGING GUIDED PORT INSERTION   11/28/2017  . radioactive seed implant prostate  2009        Home Medications    Prior to Admission medications   Medication Sig Start Date End Date Taking? Authorizing Provider  Cholecalciferol (VITAMIN D3) 5000 units CAPS Take 5,000 Units by mouth daily.   Yes [provider]  docusate sodium (COLACE) 100 MG capsule Take 100 mg by mouth daily as needed for mild constipation.   Yes [provider]  ferrous sulfate 325 (65 FE) MG EC tablet Take 1 tablet (325 mg total) by mouth 2 (two) times daily. 01/10/18 02/09/18 Yes Alma Friendly, MD  fish oil-omega-3 fatty acids 1000 MG capsule Take 1 g by mouth daily.    Yes [provider]  folic acid (FOLVITE) 1 MG tablet Take 1 mg by mouth daily.   Yes [provider]  levothyroxine (SYNTHROID, LEVOTHROID) 75 MCG tablet Take 75 mcg by mouth daily before breakfast.  10/01/15  Yes [provider]  lidocaine-prilocaine (EMLA) cream Apply 1 application topically as needed. Patient taking differently: Apply 1 application topically as needed (access port).  11/23/17  Yes Wyatt Portela, MD  Multiple Vitamin (MULTIVITAMIN WITH MINERALS) TABS Take 1 tablet by mouth daily.   Yes [provider]  pantoprazole (PROTONIX) 40 MG tablet Take 1 tablet (40 mg total) by mouth 2 (two) times daily. 01/10/18 02/09/18 Yes Alma Friendly, MD  vitamin E 400 UNIT capsule Take 800 Units by mouth daily.    Yes [provider]  leuprolide (LUPRON) 30 MG injection Inject 30 mg into the muscle every 4 (four) months.  [provider]  prochlorperazine (COMPAZINE) 10 MG tablet TAKE 1 TABLET(10 MG) BY MOUTH EVERY 6 HOURS AS NEEDED FOR NAUSEA OR VOMITING Patient taking differently: Take 10 mg by mouth every 6 (six) hours as needed for nausea or vomiting.  02/12/17   Wyatt Portela, MD    Family History Family History  Problem Relation Age of Onset  . Cancer Sister        Ovarian    Social  History Social History   Tobacco Use  . Smoking status: Former Research scientist (life sciences)  . Smokeless tobacco: Never Used  Substance Use Topics  . Alcohol use: No    Alcohol/week: 0.0 standard drinks  . Drug use: No     Allergies   Patient has no known allergies.   Review of Systems Review of Systems  Constitutional: Negative for chills and fever.  HENT: Negative for ear pain and sore throat.   Eyes: Negative for pain and visual disturbance.  Respiratory: Negative for cough and shortness of breath.   Cardiovascular: Negative for chest pain and palpitations.  Gastrointestinal: Negative for abdominal pain and vomiting.  Genitourinary: Negative for dysuria and hematuria.  Musculoskeletal: Negative for arthralgias and back pain.  Skin: Negative for color change and rash.  Neurological: Positive for weakness. Negative for dizziness, speech change, focal weakness, seizures, syncope, headaches and loss of balance.  Psychiatric/Behavioral: Negative for confusion and memory loss.  All other systems reviewed and are negative.    Physical Exam  ED Triage Vitals  Enc Vitals Group     BP 02/01/18 1513 123/62     Pulse Rate 02/01/18 1513 75     Resp 02/01/18 1513 14     Temp 02/01/18 1513 97.7 F (36.5 C)     Temp Source 02/01/18 1513 Oral     SpO2 02/01/18 1513 97 %     Weight 02/01/18 1513 141 lb (64 kg)     Height 02/01/18 1513 5' 7.5" (1.715 m)     Head Circumference --      Peak Flow --      Pain Score 02/01/18 1533 0     Pain Loc --      Pain Edu? --      Excl. in [place]? --     Physical Exam  Constitutional: He is oriented to person, place, and time. He appears cachectic.  HENT:  Head: Normocephalic and atraumatic.  Eyes: Pupils are equal, round, and reactive to light. Conjunctivae and EOM are normal.  Neck: Normal range of motion. Neck supple.  Cardiovascular: Normal rate, regular rhythm, normal heart sounds and intact distal pulses.  No murmur heard. Pulmonary/Chest: Effort normal  and breath sounds normal. No respiratory distress.  Abdominal: Soft. There is no tenderness.  Genitourinary: Rectal exam shows guaiac negative stool.  Musculoskeletal: Normal range of motion. He exhibits no edema.  Neurological: He is alert and oriented to person, place, and time.  Skin: Skin is warm and dry. Capillary refill takes less than 2 seconds.  Psychiatric: He has a normal mood and affect.  Nursing note and vitals reviewed.    ED Treatments / Results  Labs (all labs ordered are listed, but only abnormal results are displayed) Labs Reviewed  COMPREHENSIVE METABOLIC PANEL - Abnormal; Notable for the following components:      Result Value   Sodium 120 (*)    Chloride 87 (*)    Glucose, Bld 116 (*)    Creatinine, Ser 0.56 (*)    Calcium 8.1 (*)  Total Protein 5.7 (*)    Albumin 2.7 (*)    Alkaline Phosphatase 130 (*)    All other components within normal limits  CBC WITH DIFFERENTIAL/PLATELET - Abnormal; Notable for the following components:   WBC 11.0 (*)    RBC 3.03 (*)    Hemoglobin 8.8 (*)    HCT 25.4 (*)    RDW 27.1 (*)    Neutro Abs 9.4 (*)    All other components within normal limits  LIPASE, BLOOD  URINALYSIS, ROUTINE W REFLEX MICROSCOPIC  AMMONIA  PROTIME-INR  MAGNESIUM  SODIUM, URINE, RANDOM  OSMOLALITY, URINE  CORTISOL-AM, BLOOD  TSH  POC OCCULT BLOOD, ED  TYPE AND SCREEN    EKG EKG Interpretation  Date/Time:  Friday February 01 2018 16:50:10 EDT Ventricular Rate:  79 PR Interval:    QRS Duration: 103 QT Interval:  391 QTC Calculation: 449 R Axis:   61 Text Interpretation:  Sinus rhythm Confirmed by Lennice Sites [phone]) on 02/01/2018 6:19:02 PM   Radiology No results found.  Procedures Procedures (including critical care time)  Medications Ordered in ED Medications  pantoprazole (PROTONIX) EC tablet 40 mg (has no administration in time range)  0.9 %  sodium chloride infusion ( Intravenous New Bag/Given 02/01/18 1733)      Initial Impression / Assessment and Plan / ED Course  I have reviewed the triage vital signs and the nursing notes.  Pertinent labs & imaging results that were available during my care of the patient were reviewed by me and considered in my medical decision making (see chart for details).     Kalel Harty is a 77 year old male with history of prostate cancer who presents to the ED with fatigue.  Patient with normal vitals.  No fever.  Patient with ongoing fatigue and weakness over the last several days.  Recent admission for low hemoglobin.  Wife is concerned that patient possibly with low hemoglobin again as he has had general weakness over the last several days and unable to take care of himself.  Patient overall ill-appearing.  Currently on chemotherapy for prostate cancer.  Patient denies any fever, chills.  No melena or hematochezia.  No melena or hematochezia on exam.  No abdominal tenderness on exam.  Patient denies any chest pain, shortness of breath.  Clear breath sounds bilaterally.  Patient does appear pale.  Lab work shows sodium of 120 but otherwise hemoglobin at baseline.  No other significant electrolyte abnormality, kidney injury.  Patient with no signs of urinary tract infection.  Liver enzymes within normal limits.  Ammonia within normal limits.    Patient appears to have some hyponatremia at baseline however slightly worse today and concerned that this may be the possibly cause of his symptoms as patient also appears dehydrated.  Started on normal saline infusion and hospitalist to admit for further hydration and work up. EKG wnl.   Final Clinical Impressions(s) / ED Diagnoses   Final diagnoses:  Hyponatremia  Other fatigue    ED Discharge Orders    None       Lennice Sites, DO 02/01/18 1823

## 2018-02-01 NOTE — H&P (Signed)
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 [name] for metastatic prostate cancer-castration resistant-diagnosed 2009-Gleason 6 PSA 9-had brachii therapy with PSA Nadear 0.7 2013 castrate resistance began in 2017-started on Zytiga + prednisone but progression of disease in 2018 was discontinued Xtandi was started but because of poor tolerance was decreased and discontinued and he completed 6 treatments of Xofigo April 2019 and started back on Taxotere 12/2017--cycle 3 of therapy August 15--Lupron every 4 months Xgeva q. 8 weeks  Patient gives history and so does he he states that ever since chemotherapy 2 weeks prior everything has been "going downhill" baseline is able to walk independently now uses a walker and had to be rolled into his last oncology visit in a wheelchair-has been appearing pale according to wife-he received 2 units of packed red blood cells at last office visit and has had much less energy-usually is active now mainly laying on the couch and sleeping not doing much  Wife states ever since he got his teeth repaired he has not been eating as much and has lost about 15 pounds-everything he eats other than spaghetti lasagna and vegetables is difficult for him to swallow-he has had overall some weight loss over the past year and a half as well  Patient reports he was told by his primary care physician to drink a lot of Gatorade and eat salty chips over the past 1 to 2 months because she noticed his sodium had been dropping into the 120 range the lowest it has been is 125 at last  recent office visit  He does not drink or smoke currently but used to drink when he was younger male   ED Course: Sodium 120, chloride 87, BUN/creatinine 12/0.5, alk phos 130 rest of LFTs are normal ammonia is normal WBC 11.0, hemoglobin 8.8 down from 9.5 earlier this month platelet 154 gravity 1.013  urine sodium is 12  Review of Systems:  - Bleeding, - fever, - joint pain, - blurred vision, - chest pain, - bloody vomit, + Dizziness when getting up and moving around he had an episode of that this morning + Weight loss as above   Past Medical History:  Diagnosis Date  . Polyarticular psoriatic arthritis ([place])   . Prostate cancer [facility])     Past Surgical History:  Procedure Laterality Date  . ESOPHAGOGASTRODUODENOSCOPY Left 01/08/2018   Procedure: ESOPHAGOGASTRODUODENOSCOPY (EGD);  Surgeon: Carol Ada, MD;  Location: [facility];  Service: Endoscopy;  Laterality: Left;  . IR IMAGING GUIDED PORT INSERTION  11/28/2017  . radioactive seed implant prostate  2009     reports that he has quit smoking. He has never used smokeless tobacco. He reports that he does not drink alcohol or use drugs. Mobility:   No Known Allergies  Family History  Problem Relation Age of Onset  . Cancer Sister        Ovarian  Mother had heart attack Father had "black lung disease" worked in the mines Brother has been paralyzed since his 43s   Prior to Admission medications   Medication Sig Start Date End Date Taking? Authorizing Provider  Cholecalciferol (VITAMIN D3) 5000  units CAPS Take 5,000 Units by mouth daily.   Yes [provider]  docusate sodium (COLACE) 100 MG capsule Take 100 mg by mouth daily as needed for mild constipation.   Yes [provider]  ferrous sulfate 325 (65 FE) MG EC tablet Take 1 tablet (325 mg total) by mouth 2 (two) times daily. 01/10/18 02/09/18 Yes Alma Friendly, MD  fish oil-omega-3 fatty acids 1000 MG capsule Take 1 g by mouth daily.    Yes  [provider]  folic acid (FOLVITE) 1 MG tablet Take 1 mg by mouth daily.   Yes [provider]  levothyroxine (SYNTHROID, LEVOTHROID) 75 MCG tablet Take 75 mcg by mouth daily before breakfast.  10/01/15  Yes [provider]  lidocaine-prilocaine (EMLA) cream Apply 1 application topically as needed. Patient taking differently: Apply 1 application topically as needed (access port).  11/23/17  Yes Wyatt Portela, MD  Multiple Vitamin (MULTIVITAMIN WITH MINERALS) TABS Take 1 tablet by mouth daily.   Yes [provider]  pantoprazole (PROTONIX) 40 MG tablet Take 1 tablet (40 mg total) by mouth 2 (two) times daily. 01/10/18 02/09/18 Yes Alma Friendly, MD  vitamin E 400 UNIT capsule Take 800 Units by mouth daily.    Yes [provider]  leuprolide (LUPRON) 30 MG injection Inject 30 mg into the muscle every 4 (four) months.    [provider]  prochlorperazine (COMPAZINE) 10 MG tablet TAKE 1 TABLET(10 MG) BY MOUTH EVERY 6 HOURS AS NEEDED FOR NAUSEA OR VOMITING Patient taking differently: Take 10 mg by mouth every 6 (six) hours as needed for nausea or vomiting.  02/12/17   Wyatt Portela, MD    Physical Exam:  Vitals:   02/01/18 1513  BP: 123/62  Pulse: 75  Resp: 14  Temp: 97.7 F (36.5 C)  SpO2: 97%     Pleasant but frail appearing Caucasian male by temporalis wasting sunken eye  No JVD, no bruit  Right-sided port in place S1-S2 slightly tachycardic but sinus rhythm  Abdomen soft nontender no rebound no organomegaly  No lower extremity edema  Mucosas dry  No submandibular lymphadenopathy  Chest is clinically clear no added sound  I have personally reviewed following labs and imaging studies  Labs:   As above  Imaging studies:   As above  Medical tests:   EKG independently reviewed: n    Test discussed with performing physician:  n   Decision to obtain old records:   n   Review and summation of old  records:   n   Active Problems:   * No active hospital problems. *   Assessment/Plan Adult failure to thrive Likely hypovolemic hyponatremia Metastatic castrate resistant prostate cancer-most recent PSA >200 Poorly fitting dentures Hypothyroidism Encounter for palliative care   Adult failure to thrive in a setting of metastatic prostate cancer Patient probably has cancer related cachexia in addition to debility secondary to recent GI bleed and low sodium   Hypovolemic hyponatremia With a sodium of 12 and his urine and serum sodium 120 essentially he is washing out all of his solute and we will replace it with IV saline 75 cc/h-sodium deficit as 707 Normal saline contains 154 mEq/L 707/154= needs 4.5 L =4500 cc Time to correct to a sodium of 140 = 4500/75 =60 hours because he is on thyroxine we will get a TSH and an early a.m. cortisol to ensure that there is no deficit  Anemia of malignancy superimposed by  recent GI bleed-hemoglobin is only down marginally-would not transfuse below a threshold of 8.0 and we will recheck labs in a.m.  Poorly fitting dentures-I will ask speech therapy if they can give him some modalities and last that they see him in the morning  Patient alk Phosphatase econdary to metastatic disease--- would not work-up further at this juncture  Leukocytosis with mild left shift-monitor-I do not think he has a UTI but not sure therefore we will see what shows up on his UA-note that he is supposed to be on prednisone  Global-I had a long discussion with him at the bedside and reiterated what the office notes from Dr. Ernie Hew entail-I explained to him that difference between curative versus treatment aspects of a chronic incurable disease such as cancer-his wife voices that she has been told this in the past by Dr. [name] and she recalls the discussion with Dr. Ernie Hew--- veins no CODE BLUE and confirms this at the bedside and I will forward this note to his  oncologist     Severity of Illness: The appropriate patient status for this patient is OBSERVATION. Observation status is judged to be reasonable and necessary in order to provide the required intensity of service to ensure the patient's safety. The patient's presenting symptoms, physical exam findings, and initial radiographic and laboratory data in the context of their medical condition is felt to place them at decreased risk for further clinical deterioration. Furthermore, it is anticipated that the patient will be medically stable for discharge from the hospital within 2 midnights of admission. The following factors support the patient status of observation.   " The patient's presenting symptoms include weakness debility. " The physical exam findings include cachexia. " The initial radiographic and laboratory data are concerning for hyponatremia.   Lovenox, observation, likely home in 24 to 48 hours,   Time spent: 63 minutes  Verlon Au, MD  [facility] Direct contact: [phone] --[employer]ia [employer]  --[email or web address]; password [email or web address]  7PM-7AM contact night coverage as above  02/01/2018, 5:35 PM

## 2018-02-02 DIAGNOSIS — Z972 Presence of dental prosthetic device (complete) (partial): Secondary | ICD-10-CM | POA: Diagnosis not present

## 2018-02-02 DIAGNOSIS — R1319 Other dysphagia: Secondary | ICD-10-CM | POA: Diagnosis present

## 2018-02-02 DIAGNOSIS — Z87891 Personal history of nicotine dependence: Secondary | ICD-10-CM | POA: Diagnosis not present

## 2018-02-02 DIAGNOSIS — D630 Anemia in neoplastic disease: Secondary | ICD-10-CM | POA: Diagnosis present

## 2018-02-02 DIAGNOSIS — E440 Moderate protein-calorie malnutrition: Secondary | ICD-10-CM | POA: Diagnosis present

## 2018-02-02 DIAGNOSIS — Z6821 Body mass index (BMI) 21.0-21.9, adult: Secondary | ICD-10-CM | POA: Diagnosis not present

## 2018-02-02 DIAGNOSIS — D6481 Anemia due to antineoplastic chemotherapy: Secondary | ICD-10-CM | POA: Diagnosis present

## 2018-02-02 DIAGNOSIS — C7951 Secondary malignant neoplasm of bone: Secondary | ICD-10-CM | POA: Diagnosis present

## 2018-02-02 DIAGNOSIS — Z8249 Family history of ischemic heart disease and other diseases of the circulatory system: Secondary | ICD-10-CM | POA: Diagnosis not present

## 2018-02-02 DIAGNOSIS — R627 Adult failure to thrive: Secondary | ICD-10-CM | POA: Diagnosis present

## 2018-02-02 DIAGNOSIS — Z836 Family history of other diseases of the respiratory system: Secondary | ICD-10-CM | POA: Diagnosis not present

## 2018-02-02 DIAGNOSIS — R5383 Other fatigue: Secondary | ICD-10-CM | POA: Diagnosis not present

## 2018-02-02 DIAGNOSIS — Z192 Hormone resistant malignancy status: Secondary | ICD-10-CM | POA: Diagnosis not present

## 2018-02-02 DIAGNOSIS — E861 Hypovolemia: Secondary | ICD-10-CM | POA: Diagnosis present

## 2018-02-02 DIAGNOSIS — Z66 Do not resuscitate: Secondary | ICD-10-CM | POA: Diagnosis present

## 2018-02-02 DIAGNOSIS — K209 Esophagitis, unspecified: Secondary | ICD-10-CM | POA: Diagnosis present

## 2018-02-02 DIAGNOSIS — E871 Hypo-osmolality and hyponatremia: Secondary | ICD-10-CM | POA: Diagnosis present

## 2018-02-02 DIAGNOSIS — D500 Iron deficiency anemia secondary to blood loss (chronic): Secondary | ICD-10-CM | POA: Diagnosis present

## 2018-02-02 DIAGNOSIS — E039 Hypothyroidism, unspecified: Secondary | ICD-10-CM | POA: Diagnosis present

## 2018-02-02 DIAGNOSIS — C61 Malignant neoplasm of prostate: Secondary | ICD-10-CM | POA: Diagnosis present

## 2018-02-02 DIAGNOSIS — Z8041 Family history of malignant neoplasm of ovary: Secondary | ICD-10-CM | POA: Diagnosis not present

## 2018-02-02 DIAGNOSIS — R64 Cachexia: Secondary | ICD-10-CM | POA: Diagnosis present

## 2018-02-02 DIAGNOSIS — L4059 Other psoriatic arthropathy: Secondary | ICD-10-CM | POA: Diagnosis present

## 2018-02-02 DIAGNOSIS — Z993 Dependence on wheelchair: Secondary | ICD-10-CM | POA: Diagnosis not present

## 2018-02-02 DIAGNOSIS — E785 Hyperlipidemia, unspecified: Secondary | ICD-10-CM | POA: Diagnosis present

## 2018-02-02 LAB — CBC WITH DIFFERENTIAL/PLATELET
Basophils Absolute: 0 10*3/uL (ref 0.0–0.1)
Basophils Relative: 0 %
Eosinophils Absolute: 0 10*3/uL (ref 0.0–0.7)
Eosinophils Relative: 0 %
HEMATOCRIT: 25 % — AB (ref 39.0–52.0)
Hemoglobin: 8.1 g/dL — ABNORMAL LOW (ref 13.0–17.0)
LYMPHS ABS: 1.1 10*3/uL (ref 0.7–4.0)
Lymphocytes Relative: 11 %
MCH: 28.6 pg (ref 26.0–34.0)
MCHC: 32.4 g/dL (ref 30.0–36.0)
MCV: 88.3 fL (ref 78.0–100.0)
MONO ABS: 0.8 10*3/uL (ref 0.1–1.0)
MONOS PCT: 8 %
NEUTROS ABS: 7.7 10*3/uL (ref 1.7–7.7)
Neutrophils Relative %: 81 %
PLATELETS: 146 10*3/uL — AB (ref 150–400)
RBC: 2.83 MIL/uL — AB (ref 4.22–5.81)
RDW: 27.2 % — AB (ref 11.5–15.5)
WBC: 9.6 10*3/uL (ref 4.0–10.5)

## 2018-02-02 LAB — BASIC METABOLIC PANEL
ANION GAP: 7 (ref 5–15)
BUN: 6 mg/dL — ABNORMAL LOW (ref 8–23)
CALCIUM: 7.9 mg/dL — AB (ref 8.9–10.3)
CO2: 24 mmol/L (ref 22–32)
Chloride: 93 mmol/L — ABNORMAL LOW (ref 98–111)
Creatinine, Ser: 0.54 mg/dL — ABNORMAL LOW (ref 0.61–1.24)
Glucose, Bld: 86 mg/dL (ref 70–99)
POTASSIUM: 4.2 mmol/L (ref 3.5–5.1)
Sodium: 124 mmol/L — ABNORMAL LOW (ref 135–145)

## 2018-02-02 LAB — HEMOGLOBIN AND HEMATOCRIT, BLOOD
HCT: 23.8 % — ABNORMAL LOW (ref 39.0–52.0)
HEMOGLOBIN: 7.6 g/dL — AB (ref 13.0–17.0)

## 2018-02-02 LAB — PREPARE RBC (CROSSMATCH)

## 2018-02-02 MED ORDER — FUROSEMIDE 10 MG/ML IJ SOLN
20.0000 mg | Freq: Once | INTRAMUSCULAR | Status: AC
  Administered 2018-02-02: 20 mg via INTRAVENOUS
  Filled 2018-02-02: qty 2

## 2018-02-02 MED ORDER — ENSURE ENLIVE PO LIQD: 237.0000 mL | Freq: Two times a day (BID) | ORAL | Status: DC

## 2018-02-02 MED ORDER — SODIUM CHLORIDE 0.9% IV SOLUTION: Freq: Once | INTRAVENOUS | Status: DC

## 2018-02-02 MED ORDER — ACETAMINOPHEN 325 MG PO TABS
650.0000 mg | ORAL_TABLET | Freq: Once | ORAL | Status: AC
  Administered 2018-02-02: 650 mg via ORAL
  Filled 2018-02-02: qty 2

## 2018-02-02 MED ORDER — DIPHENHYDRAMINE HCL 25 MG PO CAPS
25.0000 mg | ORAL_CAPSULE | Freq: Once | ORAL | Status: AC
  Administered 2018-02-02: 25 mg via ORAL
  Filled 2018-02-02: qty 1

## 2018-02-02 NOTE — Progress Notes (Signed)
Attempted to ambulate patient but he refused at this time

## 2018-02-02 NOTE — Evaluation (Signed)
Clinical/Bedside Swallow Evaluation Patient Details  Name: Lucas Holloway MRN: [id number] Date of Birth: 01-19-1941  Today's Date: 02/02/2018 Time: SLP Start Time (ACUTE ONLY): 7846 SLP Stop Time (ACUTE ONLY): 1210 SLP Time Calculation (min) (ACUTE ONLY): 13 min  Past Medical History:  Past Medical History:  Diagnosis Date  . Polyarticular psoriatic arthritis ([place])   . Prostate cancer [facility])    Past Surgical History:  Past Surgical History:  Procedure Laterality Date  . ESOPHAGOGASTRODUODENOSCOPY Left 01/08/2018   Procedure: ESOPHAGOGASTRODUODENOSCOPY (EGD);  Surgeon: Carol Ada, MD;  Location: [facility];  Service: Endoscopy;  Laterality: Left;  . IR IMAGING GUIDED PORT INSERTION  11/28/2017  . radioactive seed implant prostate  2009   HPI:  Lucas Holloway is a 77 year old male with history of prostate cancer who presents to the ED with fatigue.   Patient with ongoing fatigue and weakness over the last several days.  Recent admission for low hemoglobin. Pt had EGD 01/08/18 which revealed LA Grade D reflux esophagitis, 4 cm hiatal hernia. Pt has had 15 lb weight loss reportedly due to difficulties chewing with dentures.   Assessment / Plan / Recommendation Clinical Impression   Patient presents with no focal weakness or observable cranial nerve impairments impacting swallow function. Mastication is mildly prolonged with regular solid, and pt takes sips of liquids to aid in mastication and bolus formation. No abnormal residue and no overt signs of aspiration with any consistency tested. Pt's wife reports that pt has had difficulty masticating meats and "he has to drink something right after" chewing solids. Pt independently uses this strategy with dry cracker and thin liquids. As pt has had poor intake, recommend least restrictive diet, with modifications to facilitate bolus formation and oral transit (can chop or cut tough meats just prior to eating, use extra sauce/gravy to moisten foods). Recommend  regular diet with thin liquids, medications whole with liquid. No further skilled ST needs identified, SLP will s/o.   SLP Visit Diagnosis: Dysphagia, unspecified (R13.10)    Aspiration Risk  Mild aspiration risk    Diet Recommendation Regular;Dysphagia 3 (Mech soft);Thin liquid;Other (Comment)(extra sauce/gravy to moisten solids)   Liquid Administration via: Cup;Straw Medication Administration: Whole meds with liquid Supervision: Patient able to self feed Compensations: Slow rate;Small sips/bites;Follow solids with liquid(moisten dry or tough solids)    Other  Recommendations Oral Care Recommendations: Oral care BID   Follow up Recommendations None      Frequency and Duration            Prognosis Prognosis for Safe Diet Advancement: Good      Swallow Study   General Date of Onset: 02/01/18 HPI: Lucas Holloway is a 77 year old male with history of prostate cancer who presents to the ED with fatigue.   Patient with ongoing fatigue and weakness over the last several days.  Recent admission for low hemoglobin. Pt had EGD 01/08/18 which revealed LA Grade D reflux esophagitis, 4 cm hiatal hernia. Pt has had 15 lb weight loss reportedly due to difficulties chewing with dentures. Type of Study: Bedside Swallow Evaluation Previous Swallow Assessment: see HPI  Diet Prior to this Study: Thin liquids;Dysphagia 2 (chopped) Temperature Spikes Noted: No Respiratory Status: Room air History of Recent Intubation: No Behavior/Cognition: Alert;Cooperative Oral Cavity Assessment: Within Functional Limits Oral Care Completed by SLP: No Oral Cavity - Dentition: Dentures, top;Dentures, bottom Vision: Functional for self-feeding Self-Feeding Abilities: Able to feed self Patient Positioning: Upright in bed Baseline Vocal Quality: Normal Volitional Cough: Strong Volitional Swallow: Able to  elicit    Oral/Motor/Sensory Function Overall Oral Motor/Sensory Function: Within functional limits   Ice  Chips Ice chips: Not tested   Thin Liquid Thin Liquid: Within functional limits Presentation: Straw;Self Fed    Nectar Thick Nectar Thick Liquid: Not tested   Honey Thick Honey Thick Liquid: Not tested   Puree Puree: Within functional limits Presentation: Self Fed;Spoon   Solid     Solid: Impaired Presentation: Self Fed Oral Phase Impairments: Impaired mastication Oral Phase Functional Implications: Impaired mastication;Prolonged oral transit     Deneise Lever, MS, CCC-SLP Speech-Language Pathologist Acute Rehabilitation Services Pager: [phone] Office: [phone]  Aliene Altes 02/02/2018,1:07 PM

## 2018-02-02 NOTE — Progress Notes (Addendum)
[facility] PROGRESS NOTE  Lucas Holloway KWI:[id number] DOB: 1941/05/16 DOA: 02/01/2018 PCP: [name], MD   Narrative: 77 year old male with castrate resistant metastatic prostate cancer currently on Taxotere Lupron, Xgeva Hyperlipidemia hypothyroid Recent discharge [facility] 01/10/2018 acute per abdominal GI bleed hemoglobin 5.9  Goes back to PCP found to have weakness no other source of but worsening of chronic hyponatremia from 04/2017 with sodium down from baseline 125 ,126---->120   A & Plan Malaise, weakness-multifactorial 2/2 hyponatremia, blood loss anemia, chemotherapy -Resolving now continue IV saline today, transfusing 1 unit PRBC-just finished physical therapy at home baseline 6 months ago able to walk independently now wheelchair/walker bound-repeat labs a.m., ambulate in room with nursing, recheck orthostatics  Dilutional anemia possible low-grade GI bleed-continue to check-no stool so argues against bleed  Probable dysphagia-likely related to poor fitting dentures-has appointment 02/05/18-speech has evaluated and felt he is most appropriate for dys ii diet  Cancer related cachexia-goals discussed with family and patient yesterday 8/30-May need goals of care further as an outpatient  Hypovolemic hyponatremia-resolving-TSH within normal limits, cortisol not performed as yet  Castrate resistant prostate cancer-forwarded note to Dr. Larey Days patient has been on multiple lines of chemo and his cancer is treatable but not curable-may need outpatient goals of care discussion   , Long discussion with wife at bedside, changed to inpatient as will need another midnight, DNR, likely discharge 9/1 versus 9/2    Jenyfer Trawick, MD  [facility] Direct contact: [phone] --Via amion app OR  --[email or web address]; password [email or web address]  7PM-7AM contact night coverage as above 02/02/2018, 2:05 PM  LOS: 0 days    Consultants:  n  Procedures:  n  Antimicrobials:  n  Interval history/Subjective: Alert pleasant no distress feels stronger Has not been out of bed No chest pain No fever No chills  Objective:  Vitals:  Vitals:   02/02/18 1214 02/02/18 1255  BP: 116/66 107/61  Pulse: 76 77  Resp: (!) 25 20  Temp: 97.9 F (36.6 C) 97.6 F (36.4 C)  SpO2: 99% 100%    Exam:  EOMI NCAT no distress JVD flat no bruit Looks better than he did yesterday Chest clear S1-S2 no murmur Abdomen soft nontender no rebound No lower extremity edema Neurologically intact moves all 4 limbs Sensory intact   I have personally reviewed the following:   Labs: Sodium up from 120-1 24 chloride up to 93 Hemoglobin trending down to 7.6 TSH 1.4  Imaging studies:  n  Medical tests:  n   Test discussed with performing physician:  n  Decision to obtain old records:  y  Review and summation of old records:  y  Scheduled Meds: . sodium chloride   Intravenous Once  . enoxaparin (LOVENOX) injection  40 mg Subcutaneous Q24H  . ferrous sulfate  325 mg Oral BID  . folic acid  1 mg Oral Daily  . furosemide  20 mg Intravenous Once  . levothyroxine  75 mcg Oral QAC breakfast  . pantoprazole  40 mg Oral BID   Continuous Infusions:  Active Problems:   Hyponatremia   LOS: 0 days

## 2018-02-02 NOTE — Progress Notes (Signed)
New Admission Note:  Arrival Method: Via [facility] on a stretcher from [employer] [facility] Orientation: Alert & Oriented x4 Telemetry: None Assessment: Completed Skin: Refer to flowsheet IV: None Pain: 0/10 Tubes: Right Chest Implanted Port Safety Measures: Safety Fall Prevention Plan discussed with patient. Admission: Completed [facility] [place] Orientation: Patient has been orientated to the room, unit and the staff.  Orders have been reviewed and are being implemented. Will continue to monitor the patient. Call light has been placed within reach.   Vassie Moselle, RN  Phone Number: [phone]

## 2018-02-02 NOTE — Progress Notes (Signed)
Initial Nutrition Assessment  DOCUMENTATION CODES:   Non-severe (moderate) malnutrition in context of chronic illness  INTERVENTION:  Provide Ensure Enlive po BID, each supplement provides 350 kcal and 20 grams of protein.  Encourage adequate PO intake.   NUTRITION DIAGNOSIS:   Moderate Malnutrition related to cancer and cancer related treatments as evidenced by moderate fat depletion, moderate muscle depletion.  GOAL:   Patient will meet greater than or equal to 90% of their needs  MONITOR:   PO intake, Supplement acceptance, Labs, Weight trends, I & O's, Skin  REASON FOR ASSESSMENT:   Malnutrition Screening Tool    ASSESSMENT:   77 year old male with castrate resistant metastatic prostate cancer currently undergoing chemotherapy, Presents with weakness, anemia, and worsening chronic hyponatremia.   Meal completion 25-100%. Pt reports decreased appetite and po intake over the past 2 week due to weakness and fatigue. Pt reports only able to consume a couple of bites of food at meals. Pt does report trying to consume at least two premier protein shakes daily. Wife are bedside reports prior to the recent 2 weeks, pt was eating well. Pt with a 8% weight loss in 5 months, however not significant for time frame. Wife has brought in premier protein shakes from home for pt to consume. Noted pt with one bout of emesis this AM. Wife does report pt with difficulty chewing foods, however has scheduled for a dentist appointment on 9/3 to fix his poor fitting dentures. Wife requesting information/education on high protein, high calorie diet. Handout "High protein, high calorie nutrition therapy" from the Academy of Nutrition and Dietetics Manual was given. Foods recommended and not recommended discussed. RD to order Ensure to aid in caloric and protein needs.   Labs and medications reviewed.   NUTRITION - FOCUSED PHYSICAL EXAM:    Most Recent Value  Orbital Region  Unable to assess  Upper  Arm Region  Moderate depletion  Thoracic and Lumbar Region  Moderate depletion  Buccal Region  Moderate depletion  Temple Region  Unable to assess  Clavicle Bone Region  Severe depletion  Clavicle and Acromion Bone Region  Severe depletion  Scapular Bone Region  Unable to assess  Dorsal Hand  Unable to assess  Patellar Region  Mild depletion  Anterior Thigh Region  Mild depletion  Posterior Calf Region  No depletion  Edema (RD Assessment)  None  Hair  Reviewed  Eyes  Reviewed  Mouth  Reviewed  Skin  Reviewed  Nails  Reviewed       Diet Order:   Diet Order            Diet regular Room service appropriate? Yes; Fluid consistency: Thin  Diet effective now              EDUCATION NEEDS:   Education needs have been addressed  Skin:  Skin Assessment: Reviewed RN Assessment  Last BM:  Unknown  Height:   Ht Readings from Last 1 Encounters:  02/01/18 5' 7.5" (1.715 m)    Weight:   Wt Readings from Last 1 Encounters:  02/01/18 64 kg    Ideal Body Weight:  68.6 kg  BMI:  Body mass index is 21.76 kg/m.  Estimated Nutritional Needs:   Kcal:  1900-2100  Protein:  90-100 grams  Fluid:  >/= 1.9 L/day    Corrin Parker, MS, RD, LDN Pager # [phone] After hours/ weekend pager # [phone]

## 2018-02-03 DIAGNOSIS — R5383 Other fatigue: Secondary | ICD-10-CM

## 2018-02-03 DIAGNOSIS — E440 Moderate protein-calorie malnutrition: Secondary | ICD-10-CM

## 2018-02-03 LAB — CBC WITH DIFFERENTIAL/PLATELET
BASOS PCT: 1 %
Basophils Absolute: 0.1 10*3/uL (ref 0.0–0.1)
EOS PCT: 0 %
Eosinophils Absolute: 0 10*3/uL (ref 0.0–0.7)
HEMATOCRIT: 30 % — AB (ref 39.0–52.0)
HEMOGLOBIN: 9.8 g/dL — AB (ref 13.0–17.0)
LYMPHS ABS: 1 10*3/uL (ref 0.7–4.0)
Lymphocytes Relative: 12 %
MCH: 28.8 pg (ref 26.0–34.0)
MCHC: 32.7 g/dL (ref 30.0–36.0)
MCV: 88.2 fL (ref 78.0–100.0)
Monocytes Absolute: 0.7 10*3/uL (ref 0.1–1.0)
Monocytes Relative: 9 %
NEUTROS ABS: 6.3 10*3/uL (ref 1.7–7.7)
Neutrophils Relative %: 78 %
Platelets: 139 10*3/uL — ABNORMAL LOW (ref 150–400)
RBC: 3.4 MIL/uL — AB (ref 4.22–5.81)
RDW: 25.3 % — AB (ref 11.5–15.5)
WBC: 8.1 10*3/uL (ref 4.0–10.5)

## 2018-02-03 LAB — TYPE AND SCREEN
ABO/RH(D): O POS
ANTIBODY SCREEN: NEGATIVE
UNIT DIVISION: 0

## 2018-02-03 LAB — BPAM RBC
BLOOD PRODUCT EXPIRATION DATE: 201909072359
ISSUE DATE / TIME: 201908311219
UNIT TYPE AND RH: 5100

## 2018-02-03 LAB — RENAL FUNCTION PANEL
Albumin: 2.5 g/dL — ABNORMAL LOW (ref 3.5–5.0)
Anion gap: 6 (ref 5–15)
BUN: 8 mg/dL (ref 8–23)
CALCIUM: 8 mg/dL — AB (ref 8.9–10.3)
CHLORIDE: 96 mmol/L — AB (ref 98–111)
CO2: 25 mmol/L (ref 22–32)
CREATININE: 0.56 mg/dL — AB (ref 0.61–1.24)
GFR calc Af Amer: >60 mL/min (ref >60)
GFR calc non Af Amer: >60 mL/min (ref >60)
Glucose, Bld: 86 mg/dL (ref 70–99)
Phosphorus: 3.2 mg/dL (ref 2.5–4.6)
Potassium: 4.2 mmol/L (ref 3.5–5.1)
SODIUM: 127 mmol/L — AB (ref 135–145)

## 2018-02-03 LAB — ABO/RH: ABO/RH(D): O POS

## 2018-02-03 MED ORDER — HEPARIN SOD (PORK) LOCK FLUSH 100 UNIT/ML IV SOLN
500.0000 [IU] | INTRAVENOUS | Status: AC | PRN
  Administered 2018-02-03: 500 [IU]

## 2018-02-03 MED ORDER — ENSURE ENLIVE PO LIQD: 237.0000 mL | Freq: Two times a day (BID) | ORAL | 12 refills | Status: AC

## 2018-02-03 NOTE — Progress Notes (Signed)
Patient ambulated approximately 120 feet from patient room to doorway past nurse's desk and back to room. Patient lost balance on way back to room but made it to room without falling. Patient assisted by NT only.

## 2018-02-03 NOTE — Progress Notes (Signed)
Patient ambulated a second time down hall (exact same distance) without assistance (NT ) standby. Patient tolerated well.

## 2018-02-03 NOTE — Care Management Note (Signed)
Case Management Note  Patient Details  Name: Lucas Holloway MRN: [id number] Date of Birth: [birth year]  Subjective/Objective:                    Action/Plan:  Spoke w patient and wife at bedside. They decline HH services. They state they have had them in the past and feel they can continue to do exercises that PT assisted with and do not see a need for RN either. They state they have all needed DME. No other CM needs identified.   Expected Discharge Date:  02/03/18               Expected Discharge Plan:     In-House Referral:     Discharge planning Services  CM Consult  Post Acute Care Choice:    Choice offered to:     DME Arranged:    DME Agency:     HH Arranged:  Patient Refused [place] Agency:     Status of Service:  Completed, signed off  If discussed at [employer] of Stay Meetings, dates discussed:    Additional Comments:  Lucas Collet, RN 02/03/2018, 9:27 AM

## 2018-02-03 NOTE — Progress Notes (Signed)
Discharged to home, AVS reviewed with patient and spouse, all questions answered. No new prescriptins to provide. Patient left floor via wheelchair with staff

## 2018-02-03 NOTE — Discharge Summary (Signed)
Physician Discharge Summary  Lucas Holloway JJK:[id number] DOB: [birth year] DOA: 02/01/2018  PCP: [name], MD  Admit date: 02/01/2018 Discharge date: 02/03/2018  Time spent: 40 minutes  Recommendations for Outpatient Follow-up:  1. Will need outpatient CBC, basic metabolic panel and iron implementation per oncologist 2. CC Dr. Jorge Mandril 3. Implement home health for further management 4. May consider outpatient free cortisol 5. Home health ordered to come on out and work with patient optimize him to his prior goals 6. Suggest outpatient goals of care per oncologist and PCP  Discharge Diagnoses:  Active Problems:   Hyponatremia   Malnutrition of moderate degree   Discharge Condition: Improved  Diet recommendation: Regular increase salt in diet  Filed Weights   02/01/18 1513  Weight: 64 kg    History of present illness:  77 year old male with castrate resistant metastatic prostate cancer currently on Taxotere Lupron, Xgeva Hyperlipidemia hypothyroid Recent discharge [facility] 01/10/2018 acute per abdominal GI bleed hemoglobin 5.9  Goes back to PCP found to have weakness no other source of but worsening of chronic hyponatremia from 04/2017 with sodium down from baseline 125 ,126---->120   Hospital Course:  Malaise, weakness-multifactorial 2/2 hyponatremia, blood loss anemia, chemotherapy -Resolving now continue IV saline today, transfusing 1 unit PRBC-just finished physical therapy at home baseline 6 months ago able to walk independently now wheelchair/walker bound He actually did really well ambulating in the hallways in the hospital after transfusion and repletion of his sodium up to 127 which is around his baseline I educated him regarding increasing solute in his diet and agree with Dr. Ival Bible  input that he needs to eat more rather than drink-I have explained this clearly to the wife as well  Dilutional anemia possible low-grade GI bleed-continue to check-no  stool so argues against bleed Tells me he probably had a colonoscopy 14 years ago-favor anemia of malignancy + use of chemotherapy >occult GI bleed an outpatient this can be monitored  Probable dysphagia-likely related to poor fitting dentures-has appointment 02/05/18-speech has evaluated and felt he is most appropriate for dys ii diet  Cancer related cachexia-goals discussed with family and patient yesterday 8/30-May need goals of care further as an outpatient though  Hypovolemic hyponatremia-resolving-TSH within normal limits, cortisol was not performed for some reason  Castrate resistant prostate cancer-forwarded note to Dr. Larey Days patient has been on multiple lines of chemo and his cancer is treatable but not curable-may need outpatient goals of care discussion has been initiated by his primary care physician and I will forward to his oncologist our notes-note that he has many questions about further management of his cancer that I have deferred at this time    Discharge Exam: Vitals:   02/03/18 0523 02/03/18 0753  BP: 128/71 133/69  Pulse: 75 73  Resp: 18 18  Temp: 98.1 F (36.7 C) 97.9 F (36.6 C)  SpO2: 99% 98%    General: Awake alert pleasant walk the halls Cardiovascular: S1-S2 no murmur rub or gallop Respiratory: Clinically clear no added sound No lower extremity edema No lymphadenopathy No pallor no icterus  Discharge Instructions   Discharge Instructions    Diet - low sodium heart healthy   Complete by:  As directed    Discharge instructions   Complete by:  As directed    Follow up Dr. [name] Consider talking to him about iv iron Please consider getting therapy to see you  Eat more than drink See Dr. Ernie Hew in 1 week for labs   Increase activity  slowly   Complete by:  As directed      Allergies as of 02/03/2018   No Known Allergies     Medication List    TAKE these medications   docusate sodium 100 MG capsule Commonly known as:  COLACE Take  100 mg by mouth daily as needed for mild constipation.   feeding supplement (ENSURE ENLIVE) Liqd Take 237 mLs by mouth 2 (two) times daily between meals.   ferrous sulfate 325 (65 FE) MG EC tablet Take 1 tablet (325 mg total) by mouth 2 (two) times daily.   fish oil-omega-3 fatty acids 1000 MG capsule Take 1 g by mouth daily.   folic acid 1 MG tablet Commonly known as:  FOLVITE Take 1 mg by mouth daily.   leuprolide 30 MG injection Commonly known as:  LUPRON Inject 30 mg into the muscle every 4 (four) months.   levothyroxine 75 MCG tablet Commonly known as:  SYNTHROID, LEVOTHROID Take 75 mcg by mouth daily before breakfast.   lidocaine-prilocaine cream Commonly known as:  EMLA Apply 1 application topically as needed. What changed:  reasons to take this   multivitamin with minerals Tabs tablet Take 1 tablet by mouth daily.   pantoprazole 40 MG tablet Commonly known as:  PROTONIX Take 1 tablet (40 mg total) by mouth 2 (two) times daily.   prochlorperazine 10 MG tablet Commonly known as:  COMPAZINE TAKE 1 TABLET(10 MG) BY MOUTH EVERY 6 HOURS AS NEEDED FOR NAUSEA OR VOMITING What changed:  See the new instructions.   Vitamin D3 5000 units Caps Take 5,000 Units by mouth daily.   vitamin E 400 UNIT capsule Take 800 Units by mouth daily.      No Known Allergies    The results of significant diagnostics from this hospitalization (including imaging, microbiology, ancillary and laboratory) are listed below for reference.    Significant Diagnostic Studies: Ct Abdomen Pelvis W Contrast  Result Date: 01/07/2018 CLINICAL DATA:  Low hemoglobin with dark stools, weakness and loss of appetite. History of prostate cancer with metastasis to bone. EXAM: CT ABDOMEN AND PELVIS WITH CONTRAST TECHNIQUE: Multidetector CT imaging of the abdomen and pelvis was performed using the standard protocol following bolus administration of intravenous contrast. CONTRAST:  126mL ISOVUE-300  IOPAMIDOL (ISOVUE-300) INJECTION 61% COMPARISON:  None. FINDINGS: Lower chest: The included heart size is normal. Minimal coronary arteriosclerosis is noted. Slight thickening of the distal esophagus associated small hiatal hernia. Esophagitis not entirely excluded. Small bilateral pleural effusions, new since prior exam on the right. Tiny calcified density at the right lung base may represent a small granuloma. Bibasilar atelectasis is identified adjacent to the pleural effusions. Hepatobiliary: Mild hepatic steatosis. No space-occupying mass noted of the liver. Physiologically distended gallbladder without stones or wall thickening. No biliary dilatation. Pancreas: Normal Spleen: Normal Adrenals/Urinary Tract: Normal bilateral adrenal glands. Symmetric cortical enhancement of both kidneys. Nonobstructing calculus in the interpolar left kidney measuring approximately 2-3 mm. No hydroureteronephrosis. Urinary bladder is physiologically distended without focal mural thickening or calculi. Stomach/Bowel: The stomach is decompressed in appearance. There is normal small bowel rotation. Increased colonic stool retention is noted as before consistent with constipation. Normal-appearing appendix. Vascular/Lymphatic: Aortic and branch vessel atherosclerosis without aneurysm. Ectasia of the right common iliac artery atherosclerotic calcifications as before measuring up to 1.6 cm. Reproductive: Brachytherapy seeds noted in the prostate. Other: No free air nor free fluid. Musculoskeletal: Slight interval increase in osteoblastic metastasis of the axial and appendicular skeleton since prior, example right L5  transverse process involvement since prior exam, series 2/53 versus series 2/48 previously and posterior elements at T11 for example. Index right iliac lesion is stable at 2.9 cm, series 2/65 versus series 2/60. IMPRESSION: 1. Some interval progression of osteoblastic metastasis since prior. 2. Increased stool retention  consistent with constipation. No bowel obstruction or inflammation. 3. Small hiatal hernia with slight distal esophageal thickening raising suspicion for esophagitis. Electronically Signed   By: Ashley Royalty M.D.   On: 01/07/2018 19:19    Microbiology: No results found for this or any previous visit (from the past 240 hour(s)).   Labs: Basic Metabolic Panel: Recent Labs  Lab 02/01/18 1605 02/01/18 1609 02/01/18 2108 02/02/18 0433 02/03/18 0333  NA  --  120*  --  124* 127*  K  --  4.5  --  4.2 4.2  CL  --  87*  --  93* 96*  CO2  --  23  --  24 25  GLUCOSE  --  116*  --  86 86  BUN  --  12  --  6* 8  CREATININE  --  0.56* 0.51* 0.54* 0.56*  CALCIUM  --  8.1*  --  7.9* 8.0*  MG 1.9  --   --   --   --   PHOS  --   --   --   --  3.2   Liver Function Tests: Recent Labs  Lab 02/01/18 1609 02/03/18 0333  AST 30  --   ALT 14  --   ALKPHOS 130*  --   BILITOT 0.3  --   PROT 5.7*  --   ALBUMIN 2.7* 2.5*   Recent Labs  Lab 02/01/18 1609  LIPASE 26   Recent Labs  Lab 02/01/18 1609  AMMONIA 17   CBC: Recent Labs  Lab 02/01/18 1609 02/01/18 2108 02/02/18 0433 02/02/18 1006 02/03/18 0333  WBC 11.0* 10.6* 9.6  --  8.1  NEUTROABS 9.4*  --  7.7  --  6.3  HGB 8.8* 8.1* 8.1* 7.6* 9.8*  HCT 25.4* 24.8* 25.0* 23.8* 30.0*  MCV 83.8 87.6 88.3  --  88.2  PLT 154 130* 146*  --  139*   Cardiac Enzymes: No results for input(s): CKTOTAL, CKMB, CKMBINDEX, TROPONINI in the last 168 hours. BNP: BNP (last 3 results) No results for input(s): BNP in the last 8760 hours.  ProBNP (last 3 results) No results for input(s): PROBNP in the last 8760 hours.  CBG: No results for input(s): GLUCAP in the last 168 hours.     Signed:  Nita Sells MD   [facility] 02/03/2018, 8:03 AM

## 2018-02-04 DIAGNOSIS — K449 Diaphragmatic hernia without obstruction or gangrene: Secondary | ICD-10-CM | POA: Diagnosis not present

## 2018-02-04 DIAGNOSIS — C61 Malignant neoplasm of prostate: Secondary | ICD-10-CM | POA: Diagnosis not present

## 2018-02-04 DIAGNOSIS — K210 Gastro-esophageal reflux disease with esophagitis: Secondary | ICD-10-CM | POA: Diagnosis not present

## 2018-02-04 DIAGNOSIS — D638 Anemia in other chronic diseases classified elsewhere: Secondary | ICD-10-CM | POA: Diagnosis not present

## 2018-02-04 DIAGNOSIS — D352 Benign neoplasm of pituitary gland: Secondary | ICD-10-CM | POA: Diagnosis not present

## 2018-02-04 DIAGNOSIS — C7951 Secondary malignant neoplasm of bone: Secondary | ICD-10-CM | POA: Diagnosis not present

## 2018-02-05 DIAGNOSIS — Z6821 Body mass index (BMI) 21.0-21.9, adult: Secondary | ICD-10-CM | POA: Diagnosis not present

## 2018-02-05 DIAGNOSIS — L4059 Other psoriatic arthropathy: Secondary | ICD-10-CM | POA: Diagnosis not present

## 2018-02-05 DIAGNOSIS — R21 Rash and other nonspecific skin eruption: Secondary | ICD-10-CM | POA: Diagnosis not present

## 2018-02-07 ENCOUNTER — Other Ambulatory Visit: Payer: Self-pay | Admitting: Oncology

## 2018-02-07 ENCOUNTER — Telehealth: Payer: Self-pay | Admitting: Oncology

## 2018-02-07 ENCOUNTER — Inpatient Hospital Stay: Payer: Medicare Other

## 2018-02-07 ENCOUNTER — Inpatient Hospital Stay: Payer: Medicare Other | Attending: Oncology

## 2018-02-07 ENCOUNTER — Inpatient Hospital Stay (HOSPITAL_BASED_OUTPATIENT_CLINIC_OR_DEPARTMENT_OTHER): Payer: Medicare Other | Admitting: Oncology

## 2018-02-07 VITALS — BP 81/65 | HR 73 | Temp 97.7°F | Resp 18 | Ht 67.5 in | Wt 141.7 lb

## 2018-02-07 VITALS — BP 111/70 | HR 78 | Resp 17

## 2018-02-07 DIAGNOSIS — Z95828 Presence of other vascular implants and grafts: Secondary | ICD-10-CM

## 2018-02-07 DIAGNOSIS — Z79899 Other long term (current) drug therapy: Secondary | ICD-10-CM

## 2018-02-07 DIAGNOSIS — Z5111 Encounter for antineoplastic chemotherapy: Secondary | ICD-10-CM | POA: Insufficient documentation

## 2018-02-07 DIAGNOSIS — I959 Hypotension, unspecified: Secondary | ICD-10-CM | POA: Insufficient documentation

## 2018-02-07 DIAGNOSIS — R630 Anorexia: Secondary | ICD-10-CM | POA: Insufficient documentation

## 2018-02-07 DIAGNOSIS — Z192 Hormone resistant malignancy status: Secondary | ICD-10-CM | POA: Diagnosis not present

## 2018-02-07 DIAGNOSIS — C7951 Secondary malignant neoplasm of bone: Secondary | ICD-10-CM

## 2018-02-07 DIAGNOSIS — D649 Anemia, unspecified: Secondary | ICD-10-CM

## 2018-02-07 DIAGNOSIS — D500 Iron deficiency anemia secondary to blood loss (chronic): Secondary | ICD-10-CM

## 2018-02-07 DIAGNOSIS — C61 Malignant neoplasm of prostate: Secondary | ICD-10-CM

## 2018-02-07 LAB — CBC WITH DIFFERENTIAL (CANCER CENTER ONLY)
BASOS PCT: 0 %
Basophils Absolute: 0 10*3/uL (ref 0.0–0.1)
Eosinophils Absolute: 0.1 10*3/uL (ref 0.0–0.5)
Eosinophils Relative: 1 %
HCT: 31.3 % — ABNORMAL LOW (ref 38.4–49.9)
HEMOGLOBIN: 10.4 g/dL — AB (ref 13.0–17.1)
Lymphocytes Relative: 18 %
Lymphs Abs: 1.2 10*3/uL (ref 0.9–3.3)
MCH: 29.2 pg (ref 27.2–33.4)
MCHC: 33.2 g/dL (ref 32.0–36.0)
MCV: 87.9 fL (ref 79.3–98.0)
MONOS PCT: 12 %
Monocytes Absolute: 0.9 10*3/uL (ref 0.1–0.9)
NEUTROS ABS: 4.8 10*3/uL (ref 1.5–6.5)
NEUTROS PCT: 69 %
NRBC: 1 /100{WBCs} — AB
Platelet Count: 87 10*3/uL — ABNORMAL LOW (ref 140–400)
RBC: 3.56 MIL/uL — AB (ref 4.20–5.82)
RDW: 25.8 % — ABNORMAL HIGH (ref 11.0–14.6)
WBC: 7 10*3/uL (ref 4.0–10.3)

## 2018-02-07 LAB — CMP (CANCER CENTER ONLY)
ALK PHOS: 161 U/L — AB (ref 38–126)
ALT: 12 U/L (ref 0–44)
ANION GAP: 7 (ref 5–15)
AST: 29 U/L (ref 15–41)
Albumin: 2.9 g/dL — ABNORMAL LOW (ref 3.5–5.0)
BUN: 10 mg/dL (ref 8–23)
CALCIUM: 8.5 mg/dL — AB (ref 8.9–10.3)
CO2: 25 mmol/L (ref 22–32)
Chloride: 93 mmol/L — ABNORMAL LOW (ref 98–111)
Creatinine: 0.66 mg/dL (ref 0.61–1.24)
GFR, Estimated: >60 mL/min (ref >60)
Glucose, Bld: 104 mg/dL — ABNORMAL HIGH (ref 70–99)
Potassium: 4.5 mmol/L (ref 3.5–5.1)
SODIUM: 125 mmol/L — AB (ref 135–145)
Total Bilirubin: 0.4 mg/dL (ref 0.3–1.2)
Total Protein: 5.8 g/dL — ABNORMAL LOW (ref 6.5–8.1)

## 2018-02-07 LAB — IRON AND TIBC
Iron: 75 ug/dL (ref 42–163)
SATURATION RATIOS: 30 % — AB (ref 42–163)
TIBC: 248 ug/dL (ref 202–409)
UIBC: 173 ug/dL

## 2018-02-07 LAB — FERRITIN: Ferritin: 1310 ng/mL — ABNORMAL HIGH (ref 24–336)

## 2018-02-07 MED ORDER — HEPARIN SOD (PORK) LOCK FLUSH 100 UNIT/ML IV SOLN
500.0000 [IU] | Freq: Once | INTRAVENOUS | Status: AC
  Administered 2018-02-07: 500 [IU] via INTRAVENOUS
  Filled 2018-02-07: qty 5

## 2018-02-07 MED ORDER — SODIUM CHLORIDE 0.9 % IV SOLN
Freq: Once | INTRAVENOUS | Status: AC
  Administered 2018-02-07: 13:00:00 via INTRAVENOUS
  Filled 2018-02-07: qty 250

## 2018-02-07 MED ORDER — SODIUM CHLORIDE 0.9% FLUSH
10.0000 mL | Freq: Once | INTRAVENOUS | Status: AC
  Administered 2018-02-07: 10 mL via INTRAVENOUS
  Filled 2018-02-07: qty 10

## 2018-02-07 MED ORDER — PANTOPRAZOLE SODIUM 40 MG PO TBEC: 40.0000 mg | DELAYED_RELEASE_TABLET | Freq: Two times a day (BID) | ORAL | 0 refills | Status: DC

## 2018-02-07 MED ORDER — FERROUS SULFATE 325 (65 FE) MG PO TBEC: 325.0000 mg | DELAYED_RELEASE_TABLET | Freq: Two times a day (BID) | ORAL | 0 refills | Status: DC

## 2018-02-07 MED ORDER — SODIUM CHLORIDE 0.9% FLUSH
10.0000 mL | INTRAVENOUS | Status: DC | PRN
  Administered 2018-02-07: 10 mL via INTRAVENOUS
  Filled 2018-02-07: qty 10

## 2018-02-07 NOTE — Telephone Encounter (Signed)
Appts scheduled AVS/Calendar printed per 9/5 los °

## 2018-02-07 NOTE — Progress Notes (Signed)
Hematology and Oncology Follow Up Visit  Lucas Holloway [id number] 04/09/1941 77 y.o. 02/07/2018 11:40 AM Lucas Holloway, MDDewey, [name], MD   Principle Diagnosis: 77 year old man with castration-resistant prostate cancer diagnosed in 2017.  He was initially diagnosed in 2009 and found to have Gleason score 6, PSA of 9.   Prior Therapy: He was treated with brachytherapy with excellent PSA response initially. His PSA nadir was 0.7 and 2013.   He developed metastases in October 2016 with bone metastasis. He was started on androgen deprivation under the care of Dr. Jeffie Holloway and his PSA did drop down initially to 0.61 in May 2017. He developed castration resistant disease in 2017. Zytiga 1000 mg daily with prednisone 5 mg daily started on 01/07/2016. Therapy discontinued and 2018 because of progression of disease. Xtandi 160 mg daily started in August 2018. The dose was reduced to 80 mg daily starting on 02/12/2017 for better tolerance.  Therapy discontinued in October 2018 because of poor tolerance and progression of disease. Xofigo infusion on a monthly basis started on May 04, 2017.  He completed 6 treatments in April 2019.  Current therapy:   Taxotere chemotherapy started on December 04, 2017.  He is status post 3 cycles of chemotherapy.  He is receiving Xgeva every 8 weeks.  This is been on hold because of dental procedures.  He is currently on Lupron at 30 mg every 4 months.  Last injection was given on December 04, 2017.  Interim History: Lucas Holloway is here for a follow-up visit.  Since the last visit, he was placed in the month of August initially for GI bleeding and subsequently due to adrenal insufficiency and hyponatremia.  Since his discharge, he has been recovering rather slowly and improving ambulation.  He is using a walker no recent falls.  He does report generalized weakness but no specific pain.  He denies any hematochezia or melena.  His appetite is improving and he is taking oral  iron supplements.  His performance status has been limited although not declining.   He does not report any headaches, blurry vision, syncope.  Denies any dizziness or alteration of mental status.  He does not report any fevers or chills or sweats. He does not report any cough, wheezing or hemoptysis. He does not report any chest pain, palpitation orthopnea. He does not report any nausea, vomiting or abdominal pain.  He denies any constipation or diarrhea..  He does not report any frequency urgency or hesitancy. He does not report any arthralgias or myalgias.  Does not report any bleeding or clotting tendencies.  He does not report any lymphadenopathy or petechiae.  Remaining review of systems is negative.  Medications: I have reviewed the patient's current medications.  Current Outpatient Medications  Medication Sig Dispense Refill  . Cholecalciferol (VITAMIN D3) 5000 units CAPS Take 5,000 Units by mouth daily.    Marland Kitchen docusate sodium (COLACE) 100 MG capsule Take 100 mg by mouth daily as needed for mild constipation.    . feeding supplement, ENSURE ENLIVE, (ENSURE ENLIVE) LIQD Take 237 mLs by mouth 2 (two) times daily between meals. 237 mL 12  . ferrous sulfate 325 (65 FE) MG EC tablet Take 1 tablet (325 mg total) by mouth 2 (two) times daily. 60 tablet 0  . fish oil-omega-3 fatty acids 1000 MG capsule Take 1 g by mouth daily.     . folic acid (FOLVITE) 1 MG tablet Take 1 mg by mouth daily.    Marland Kitchen leuprolide (  LUPRON) 30 MG injection Inject 30 mg into the muscle every 4 (four) months.    . levothyroxine (SYNTHROID, LEVOTHROID) 75 MCG tablet Take 75 mcg by mouth daily before breakfast.   3  . lidocaine-prilocaine (EMLA) cream Apply 1 application topically as needed. (Patient taking differently: Apply 1 application topically as needed (access port). ) 30 g 0  . Multiple Vitamin (MULTIVITAMIN WITH MINERALS) TABS Take 1 tablet by mouth daily.    . pantoprazole (PROTONIX) 40 MG tablet Take 1 tablet (40 mg  total) by mouth 2 (two) times daily. 60 tablet 0  . prochlorperazine (COMPAZINE) 10 MG tablet TAKE 1 TABLET(10 MG) BY MOUTH EVERY 6 HOURS AS NEEDED FOR NAUSEA OR VOMITING (Patient taking differently: Take 10 mg by mouth every 6 (six) hours as needed for nausea or vomiting. ) 385 tablet 0  . vitamin E 400 UNIT capsule Take 800 Units by mouth daily.      No current facility-administered medications for this visit.    Facility-Administered Medications Ordered in Other Visits  Medication Dose Route Frequency Provider Last Rate Last Dose  . sodium chloride flush (NS) 0.9 % injection 10 mL  10 mL Intravenous PRN Lucas Portela, MD   10 mL at 02/07/18 1128     Allergies: No Known Allergies  Past Medical History, Surgical history, Social history, and Family History reviewed today and remain unchanged.  Physical Exam:  Blood pressure (!) 81/65, pulse 73, temperature 97.7 F (36.5 C), temperature source Oral, resp. rate 18, height 5' 7.5" (1.715 m), weight 141 lb 11.2 oz (64.3 kg), SpO2 97 %.    ECOG: 1    General appearance: Comfortable appearing without any discomfort Head: Normocephalic without any trauma Oropharynx: Mucous membranes are moist and pink without any thrush or ulcers. Eyes: Pupils are equal and round reactive to light. Lymph nodes: No cervical, supraclavicular, inguinal or axillary lymphadenopathy.   Heart:regular rate and rhythm.  S1 and S2 without leg edema. Lung: Clear without any rhonchi or wheezes.  No dullness to percussion. Abdomin: Soft, nontender, nondistended with good bowel sounds.  No hepatosplenomegaly. Musculoskeletal: No joint deformity or effusion.  Full range of motion noted. Neurological: No deficits noted on motor, sensory and deep tendon reflex exam. Skin: No petechial rash or dryness.  Appeared moist.  Psychiatric: Mood and affect appeared appropriate.   Lab Results: Lab Results  Component Value Date   WBC 8.1 02/03/2018   HGB 9.8 (L)  02/03/2018   HCT 30.0 (L) 02/03/2018   MCV 88.2 02/03/2018   PLT 139 (L) 02/03/2018     Chemistry      Component Value Date/Time   NA 127 (L) 02/03/2018 0333   NA 125 (L) 06/01/2017 1406   K 4.2 02/03/2018 0333   K 4.6 06/01/2017 1406   CL 96 (L) 02/03/2018 0333   CO2 25 02/03/2018 0333   CO2 23 06/01/2017 1406   BUN 8 02/03/2018 0333   BUN 20.6 06/01/2017 1406   CREATININE 0.56 (L) 02/03/2018 0333   CREATININE 0.64 01/17/2018 0816   CREATININE 0.7 06/01/2017 1406      Component Value Date/Time   CALCIUM 8.0 (L) 02/03/2018 0333   CALCIUM 9.1 06/01/2017 1406   ALKPHOS 130 (H) 02/01/2018 1609   ALKPHOS 94 06/01/2017 1406   AST 30 02/01/2018 1609   AST 27 01/17/2018 0816   AST 29 06/01/2017 1406   ALT 14 02/01/2018 1609   ALT 11 01/17/2018 0816   ALT 20 06/01/2017 1406  BILITOT 0.3 02/01/2018 1609   BILITOT 0.4 01/17/2018 0816   BILITOT 0.40 06/01/2017 1406       Impression and Plan:  77 year old man with:   1. Castration-resistant prostate cancer diagnosed in 2017 and status post multiple therapies outlined above.   He is status post 3 cycles of Taxotere chemotherapy and his PSA continues to rise after 2 cycles.  The natural course of this disease as well as treatment options were reviewed today in detail.  Depending on his PSA results we will have a better idea of the trajectory of his cancer.  If his PSA continues to rise, I will discontinue Taxotere and consider switching to [facility].  The option of discontinuing chemotherapy altogether and proceeding with hospice care was also reviewed.  For the time being, he feels he is not ready to stop chemotherapy but will consider holding it for a period of time.  We will give him a treatment break from today and will reevaluated in 3 weeks.  He can potentially start chemotherapy with Jevtana as an alternative to Taxotere.  2.  Hypotension: Unclear etiology at this time.  Could be related to adrenal insufficiency versus  dehydration.  He will receive 1 L normal saline today and set of chemotherapy.  3. Androgen depravation: His next Lupron will be given in November 2019.  4.  Anorexia: Improving at this time and his weight is stable.  5.  IV access: Port-A-Cath has been accessed and in use without complications.  6.  Antiemetics: No nausea or vomiting reported today.  Antiemetics is available to him.  7.  Growth factor support: He will receive growth factor support if chemotherapy to be continued.  He is at high risk of neutropenic fever.  8.  Prognosis and goals of care: The goal of therapy remains palliative and if his quality of life does not improve on chemotherapy we will discontinue it.  9.  Anemia: Related to GI bleeding from an ulcer.  His hemoglobin is improving currently on oral iron supplements.  10. Follow-up: In 3 weeks for the next cycle of chemotherapy.  25  minutes was spent with the patient face-to-face today.  More than 50% of time was dedicated to reviewing the natural course of his disease, treatment options and management of complications related to therapy.     Zola Button, MD 9/5/201911:40 AM

## 2018-02-07 NOTE — Patient Instructions (Signed)
Dehydration, Adult Dehydration is when there is not enough fluid or water in your body. This happens when you lose more fluids than you take in. Dehydration can range from mild to very bad. It should be treated right away to keep it from getting very bad. Symptoms of mild dehydration may include:  Thirst.  Dry lips.  Slightly dry mouth.  Dry, warm skin.  Dizziness. Symptoms of moderate dehydration may include:  Very dry mouth.  Muscle cramps.  Dark pee (urine). Pee may be the color of tea.  Your body making less pee.  Your eyes making fewer tears.  Heartbeat that is uneven or faster than normal (palpitations).  Headache.  Light-headedness, especially when you stand up from sitting.  Fainting (syncope). Symptoms of very bad dehydration may include:  Changes in skin, such as: ? Cold and clammy skin. ? Blotchy (mottled) or pale skin. ? Skin that does not quickly return to normal after being lightly pinched and let go (poor skin turgor).  Changes in body fluids, such as: ? Feeling very thirsty. ? Your eyes making fewer tears. ? Not sweating when body temperature is high, such as in hot weather. ? Your body making very little pee.  Changes in vital signs, such as: ? Weak pulse. ? Pulse that is more than 100 beats a minute when you are sitting still. ? Fast breathing. ? Low blood pressure.  Other changes, such as: ? Sunken eyes. ? Cold hands and feet. ? Confusion. ? Lack of energy (lethargy). ? Trouble waking up from sleep. ? Short-term weight loss. ? Unconsciousness. Follow these instructions at home:  If told by your doctor, drink an ORS: ? Make an ORS by using instructions on the package. ? Start by drinking small amounts, about  cup (120 mL) every 5-10 minutes. ? Slowly drink more until you have had the amount that your doctor said to have.  Drink enough clear fluid to keep your pee clear or pale yellow. If you were told to drink an ORS, finish the ORS  first, then start slowly drinking clear fluids. Drink fluids such as: ? Water. Do not drink only water by itself. Doing that can make the salt (sodium) level in your body get too low (hyponatremia). ? Ice chips. ? Fruit juice that you have added water to (diluted). ? Low-calorie sports drinks.  Avoid: ? Alcohol. ? Drinks that have a lot of sugar. These include high-calorie sports drinks, fruit juice that does not have water added, and soda. ? Caffeine. ? Foods that are greasy or have a lot of fat or sugar.  Take over-the-counter and prescription medicines only as told by your doctor.  Do not take salt tablets. Doing that can make the salt level in your body get too high (hypernatremia).  Eat foods that have minerals (electrolytes). Examples include bananas, oranges, potatoes, tomatoes, and spinach.  Keep all follow-up visits as told by your doctor. This is important. Contact a doctor if:  You have belly (abdominal) pain that: ? Gets worse. ? Stays in one area (localizes).  You have a rash.  You have a stiff neck.  You get angry or annoyed more easily than normal (irritability).  You are more sleepy than normal.  You have a harder time waking up than normal.  You feel: ? Weak. ? Dizzy. ? Very thirsty.  You have peed (urinated) only a small amount of very dark pee during 6-8 hours. Get help right away if:  You have symptoms of   very bad dehydration.  You cannot drink fluids without throwing up (vomiting).  Your symptoms get worse with treatment.  You have a fever.  You have a very bad headache.  You are throwing up or having watery poop (diarrhea) and it: ? Gets worse. ? Does not go away.  You have blood or something green (bile) in your throw-up.  You have blood in your poop (stool). This may cause poop to look black and tarry.  You have not peed in 6-8 hours.  You pass out (faint).  Your heart rate when you are sitting still is more than 100 beats a  minute.  You have trouble breathing. This information is not intended to replace advice given to you by your health care provider. Make sure you discuss any questions you have with your health care provider. Document Released: 03/18/2009 Document Revised: 12/10/2015 Document Reviewed: 07/16/2015 Elsevier Interactive Patient Education  2018 [employer] Inc[employer]  

## 2018-02-08 ENCOUNTER — Telehealth: Payer: Self-pay | Admitting: *Deleted

## 2018-02-08 ENCOUNTER — Inpatient Hospital Stay: Payer: Medicare Other

## 2018-02-08 LAB — PROSTATE-SPECIFIC AG, SERUM (LABCORP): Prostate Specific Ag, Serum: 121 ng/mL — ABNORMAL HIGH (ref 0.0–4.0)

## 2018-02-08 NOTE — Telephone Encounter (Signed)
As noted below by Dr. Shadad, I informed patient of his PSA level. He verbalized understanding.  

## 2018-02-08 NOTE — Telephone Encounter (Signed)
-----   Message from Wyatt Portela, MD sent at 02/08/2018  8:18 AM EDT ----- Please let him know his PSA is down.

## 2018-02-11 ENCOUNTER — Telehealth: Payer: Self-pay | Admitting: *Deleted

## 2018-02-11 DIAGNOSIS — K210 Gastro-esophageal reflux disease with esophagitis: Secondary | ICD-10-CM | POA: Diagnosis not present

## 2018-02-11 DIAGNOSIS — D638 Anemia in other chronic diseases classified elsewhere: Secondary | ICD-10-CM | POA: Diagnosis not present

## 2018-02-11 DIAGNOSIS — C7951 Secondary malignant neoplasm of bone: Secondary | ICD-10-CM | POA: Diagnosis not present

## 2018-02-11 DIAGNOSIS — C61 Malignant neoplasm of prostate: Secondary | ICD-10-CM | POA: Diagnosis not present

## 2018-02-11 DIAGNOSIS — K449 Diaphragmatic hernia without obstruction or gangrene: Secondary | ICD-10-CM | POA: Diagnosis not present

## 2018-02-11 DIAGNOSIS — D352 Benign neoplasm of pituitary gland: Secondary | ICD-10-CM | POA: Diagnosis not present

## 2018-02-11 NOTE — Telephone Encounter (Signed)
Received voice mail message from [place], [place], with Lucas, who saw patient today. She stated,"he has increased weakness, fatigue, no appetite, lays on the couch all day. He has reported some dark blood in his stool, but has had fresh blood spots on the toilet paper. Beth's question is it OK with Dr. [name] if he gets a hospital bed, and a Palliative Care referral? Return number is [phone].

## 2018-02-12 ENCOUNTER — Telehealth: Payer: Self-pay

## 2018-02-12 ENCOUNTER — Telehealth: Payer: Self-pay | Admitting: *Deleted

## 2018-02-12 DIAGNOSIS — C7951 Secondary malignant neoplasm of bone: Secondary | ICD-10-CM

## 2018-02-12 NOTE — Telephone Encounter (Signed)
Spoke with stacey at [facility] [place] and palliative referral made. Hospital bed ordered from [facility] thru epic. Beth RN  From advanced notified.

## 2018-02-12 NOTE — Telephone Encounter (Signed)
Yes

## 2018-02-12 NOTE — Telephone Encounter (Signed)
Phone call placed to patient to introduce Palliative Care and to schedule a visit with NP. Visit scheduled for Thursday 02/14/18. Address confirmed

## 2018-02-13 DIAGNOSIS — D638 Anemia in other chronic diseases classified elsewhere: Secondary | ICD-10-CM | POA: Diagnosis not present

## 2018-02-13 DIAGNOSIS — K210 Gastro-esophageal reflux disease with esophagitis: Secondary | ICD-10-CM | POA: Diagnosis not present

## 2018-02-13 DIAGNOSIS — K449 Diaphragmatic hernia without obstruction or gangrene: Secondary | ICD-10-CM | POA: Diagnosis not present

## 2018-02-13 DIAGNOSIS — C7951 Secondary malignant neoplasm of bone: Secondary | ICD-10-CM | POA: Diagnosis not present

## 2018-02-13 DIAGNOSIS — C61 Malignant neoplasm of prostate: Secondary | ICD-10-CM | POA: Diagnosis not present

## 2018-02-13 DIAGNOSIS — D352 Benign neoplasm of pituitary gland: Secondary | ICD-10-CM | POA: Diagnosis not present

## 2018-02-14 ENCOUNTER — Other Ambulatory Visit: Payer: Medicare Other | Admitting: Nurse Practitioner

## 2018-02-14 ENCOUNTER — Encounter: Payer: Self-pay | Admitting: Nurse Practitioner

## 2018-02-14 ENCOUNTER — Encounter: Payer: Self-pay | Admitting: *Deleted

## 2018-02-14 ENCOUNTER — Telehealth: Payer: Self-pay | Admitting: *Deleted

## 2018-02-14 DIAGNOSIS — R112 Nausea with vomiting, unspecified: Secondary | ICD-10-CM

## 2018-02-14 DIAGNOSIS — K449 Diaphragmatic hernia without obstruction or gangrene: Secondary | ICD-10-CM | POA: Diagnosis not present

## 2018-02-14 DIAGNOSIS — C7951 Secondary malignant neoplasm of bone: Secondary | ICD-10-CM | POA: Diagnosis not present

## 2018-02-14 DIAGNOSIS — D352 Benign neoplasm of pituitary gland: Secondary | ICD-10-CM | POA: Diagnosis not present

## 2018-02-14 DIAGNOSIS — R634 Abnormal weight loss: Secondary | ICD-10-CM

## 2018-02-14 DIAGNOSIS — R531 Weakness: Secondary | ICD-10-CM

## 2018-02-14 DIAGNOSIS — Z515 Encounter for palliative care: Secondary | ICD-10-CM

## 2018-02-14 DIAGNOSIS — C61 Malignant neoplasm of prostate: Secondary | ICD-10-CM | POA: Diagnosis not present

## 2018-02-14 DIAGNOSIS — D638 Anemia in other chronic diseases classified elsewhere: Secondary | ICD-10-CM | POA: Diagnosis not present

## 2018-02-14 DIAGNOSIS — K210 Gastro-esophageal reflux disease with esophagitis: Secondary | ICD-10-CM | POA: Diagnosis not present

## 2018-02-14 DIAGNOSIS — E46 Unspecified protein-calorie malnutrition: Secondary | ICD-10-CM

## 2018-02-14 NOTE — Telephone Encounter (Signed)
Notes faxed to hme for hospital bed

## 2018-02-14 NOTE — Progress Notes (Signed)
PALLIATIVE CARE CONSULT VISIT   PATIENT NAME: Lucas Holloway DOB: April 23, 1941 MRN: [id number]  PRIMARY CARE PROVIDER:   [name], MD  REFERRING PROVIDER:  [name], [address] [place], [place] [address]  RESPONSIBLE PARTY:   Kemoni Ortega [id number]     RECOMMENDATIONS and PLAN:  Castration-resistant prostate cancer with metatasis to the bone  -generalized weakness -followed by Heme-Onc(LV 02/07/18);s/p brachytherapy, androgen deprivation and multiple chemotherapies with continued disease progression -chemotherapy on hold for now: hem-onc to re-evalate in three weeks -patient reports generalized weakness; denies pain but patient is very stoic   FTT/protein-calorie malnutrition/weight loss  -nausea -encouraged to increase protein and caloric intake as much as possible -recommended supplements (protein drinks at least BID if possible) -has PRN's; suggested taking PRN in the AM to see if it would decrease nausea and hopefully he could eat more  ACP -to make return appointment after next oncology OV; will then discuss goals of care depending on treatment options    I spent 60 minutes providing this consultation,  from 11:00 to 12:00. More than 50% of the time in this consultation was spent coordinating communication.   HISTORY OF PRESENT ILLNESS:  Lucas Holloway is a 77 y.o. year old male with multiple medical problems including Castration-resistant prostate cancer with metatasis to the bone, failure to thrive, generalized weakness, weight loss, protein calorie malnutrition . Palliative Care was asked to help with symptom management and to  address goals of care.   CODE STATUS: Full  PPS: 50% HOSPICE ELIGIBILITY/DIAGNOSIS: TBD  PAST MEDICAL HISTORY:  Past Medical History:  Diagnosis Date  . Polyarticular psoriatic arthritis ([place])   . Prostate cancer [facility])     SOCIAL HX:  Social History   Tobacco Use  . Smoking status: Former Research scientist (life sciences)  . Smokeless  tobacco: Never Used  Substance Use Topics  . Alcohol use: No    Alcohol/week: 0.0 standard drinks    ALLERGIES: No Known Allergies   PERTINENT MEDICATIONS:  Outpatient Encounter Medications as of 02/14/2018  Medication Sig  . Cholecalciferol (VITAMIN D3) 5000 units CAPS Take 5,000 Units by mouth daily.  Marland Kitchen docusate sodium (COLACE) 100 MG capsule Take 100 mg by mouth daily as needed for mild constipation.  . feeding supplement, ENSURE ENLIVE, (ENSURE ENLIVE) LIQD Take 237 mLs by mouth 2 (two) times daily between meals.  . ferrous sulfate 325 (65 FE) MG EC tablet Take 1 tablet (325 mg total) by mouth 2 (two) times daily.  . fish oil-omega-3 fatty acids 1000 MG capsule Take 1 g by mouth daily.   . folic acid (FOLVITE) 1 MG tablet Take 1 mg by mouth daily.  Marland Kitchen leuprolide (LUPRON) 30 MG injection Inject 30 mg into the muscle every 4 (four) months.  . levothyroxine (SYNTHROID, LEVOTHROID) 75 MCG tablet Take 75 mcg by mouth daily before breakfast.   . lidocaine-prilocaine (EMLA) cream Apply 1 application topically as needed. (Patient taking differently: Apply 1 application topically as needed (access port). )  . Multiple Vitamin (MULTIVITAMIN WITH MINERALS) TABS Take 1 tablet by mouth daily.  . pantoprazole (PROTONIX) 40 MG tablet TAKE 1 TABLET(40 MG) BY MOUTH TWICE DAILY  . prochlorperazine (COMPAZINE) 10 MG tablet TAKE 1 TABLET(10 MG) BY MOUTH EVERY 6 HOURS AS NEEDED FOR NAUSEA OR VOMITING (Patient taking differently: Take 10 mg by mouth every 6 (six) hours as needed for nausea or vomiting. )  . vitamin E 400 UNIT capsule Take 800 Units by mouth daily.  No facility-administered encounter medications on file as of 02/14/2018.     PHYSICAL EXAM:   General: NAD, frail appearing, thin Cardiovascular: regular rate and rhythm Pulmonary: clear ant fields Abdomen: soft, nontender, + bowel sounds GU: no suprapubic tenderness Extremities: no edema, no joint deformities Skin: no  rashes Neurological: Weakness but otherwise nonfocal  Devanee Pomplun G [place], NP

## 2018-02-18 DIAGNOSIS — C7951 Secondary malignant neoplasm of bone: Secondary | ICD-10-CM | POA: Diagnosis not present

## 2018-02-18 DIAGNOSIS — C61 Malignant neoplasm of prostate: Secondary | ICD-10-CM | POA: Diagnosis not present

## 2018-02-18 DIAGNOSIS — K449 Diaphragmatic hernia without obstruction or gangrene: Secondary | ICD-10-CM | POA: Diagnosis not present

## 2018-02-18 DIAGNOSIS — K210 Gastro-esophageal reflux disease with esophagitis: Secondary | ICD-10-CM | POA: Diagnosis not present

## 2018-02-18 DIAGNOSIS — D638 Anemia in other chronic diseases classified elsewhere: Secondary | ICD-10-CM | POA: Diagnosis not present

## 2018-02-18 DIAGNOSIS — D352 Benign neoplasm of pituitary gland: Secondary | ICD-10-CM | POA: Diagnosis not present

## 2018-02-19 ENCOUNTER — Other Ambulatory Visit: Payer: Self-pay | Admitting: *Deleted

## 2018-02-19 ENCOUNTER — Telehealth: Payer: Self-pay | Admitting: *Deleted

## 2018-02-19 DIAGNOSIS — K210 Gastro-esophageal reflux disease with esophagitis: Secondary | ICD-10-CM | POA: Diagnosis not present

## 2018-02-19 DIAGNOSIS — K449 Diaphragmatic hernia without obstruction or gangrene: Secondary | ICD-10-CM | POA: Diagnosis not present

## 2018-02-19 DIAGNOSIS — C61 Malignant neoplasm of prostate: Secondary | ICD-10-CM

## 2018-02-19 DIAGNOSIS — C7951 Secondary malignant neoplasm of bone: Secondary | ICD-10-CM | POA: Diagnosis not present

## 2018-02-19 DIAGNOSIS — D352 Benign neoplasm of pituitary gland: Secondary | ICD-10-CM | POA: Diagnosis not present

## 2018-02-19 DIAGNOSIS — D638 Anemia in other chronic diseases classified elsewhere: Secondary | ICD-10-CM | POA: Diagnosis not present

## 2018-02-19 MED ORDER — MEGESTROL ACETATE 400 MG/10ML PO SUSP: 400.0000 mg | Freq: Two times a day (BID) | ORAL | 1 refills | Status: AC

## 2018-02-19 NOTE — Telephone Encounter (Signed)
Received voice mail message from [place] patient has a poor appetite. He is drinking Ensure, Boost and a few bites of food. Do you think Dr. [name] would consider an appetite stimulant? Return number is [phone]."

## 2018-02-19 NOTE — Telephone Encounter (Signed)
Megace bid. Please Rx for him.

## 2018-02-21 DIAGNOSIS — K210 Gastro-esophageal reflux disease with esophagitis: Secondary | ICD-10-CM | POA: Diagnosis not present

## 2018-02-21 DIAGNOSIS — K449 Diaphragmatic hernia without obstruction or gangrene: Secondary | ICD-10-CM | POA: Diagnosis not present

## 2018-02-21 DIAGNOSIS — C7951 Secondary malignant neoplasm of bone: Secondary | ICD-10-CM | POA: Diagnosis not present

## 2018-02-21 DIAGNOSIS — C61 Malignant neoplasm of prostate: Secondary | ICD-10-CM | POA: Diagnosis not present

## 2018-02-21 DIAGNOSIS — D638 Anemia in other chronic diseases classified elsewhere: Secondary | ICD-10-CM | POA: Diagnosis not present

## 2018-02-21 DIAGNOSIS — D352 Benign neoplasm of pituitary gland: Secondary | ICD-10-CM | POA: Diagnosis not present

## 2018-02-25 ENCOUNTER — Emergency Department (HOSPITAL_COMMUNITY)
Admission: EM | Admit: 2018-02-25 | Discharge: 2018-02-25 | Disposition: A | Discharge status: Home / Self Care | Payer: Medicare Other | Attending: Emergency Medicine | Admitting: Emergency Medicine

## 2018-02-25 ENCOUNTER — Emergency Department (HOSPITAL_COMMUNITY): Payer: Medicare Other

## 2018-02-25 ENCOUNTER — Telehealth: Payer: Self-pay | Admitting: *Deleted

## 2018-02-25 DIAGNOSIS — Z87891 Personal history of nicotine dependence: Secondary | ICD-10-CM | POA: Diagnosis not present

## 2018-02-25 DIAGNOSIS — R140 Abdominal distension (gaseous): Secondary | ICD-10-CM | POA: Diagnosis not present

## 2018-02-25 DIAGNOSIS — M6281 Muscle weakness (generalized): Secondary | ICD-10-CM | POA: Diagnosis not present

## 2018-02-25 DIAGNOSIS — R404 Transient alteration of awareness: Secondary | ICD-10-CM | POA: Diagnosis not present

## 2018-02-25 DIAGNOSIS — R531 Weakness: Secondary | ICD-10-CM

## 2018-02-25 DIAGNOSIS — C61 Malignant neoplasm of prostate: Secondary | ICD-10-CM | POA: Diagnosis not present

## 2018-02-25 DIAGNOSIS — R410 Disorientation, unspecified: Secondary | ICD-10-CM | POA: Diagnosis not present

## 2018-02-25 DIAGNOSIS — W19XXXA Unspecified fall, initial encounter: Secondary | ICD-10-CM | POA: Diagnosis not present

## 2018-02-25 DIAGNOSIS — C7951 Secondary malignant neoplasm of bone: Secondary | ICD-10-CM | POA: Diagnosis not present

## 2018-02-25 DIAGNOSIS — S4991XA Unspecified injury of right shoulder and upper arm, initial encounter: Secondary | ICD-10-CM | POA: Diagnosis not present

## 2018-02-25 DIAGNOSIS — R1084 Generalized abdominal pain: Secondary | ICD-10-CM | POA: Insufficient documentation

## 2018-02-25 DIAGNOSIS — K449 Diaphragmatic hernia without obstruction or gangrene: Secondary | ICD-10-CM | POA: Diagnosis not present

## 2018-02-25 DIAGNOSIS — D638 Anemia in other chronic diseases classified elsewhere: Secondary | ICD-10-CM | POA: Diagnosis not present

## 2018-02-25 DIAGNOSIS — D352 Benign neoplasm of pituitary gland: Secondary | ICD-10-CM | POA: Diagnosis not present

## 2018-02-25 DIAGNOSIS — S3991XA Unspecified injury of abdomen, initial encounter: Secondary | ICD-10-CM | POA: Diagnosis not present

## 2018-02-25 DIAGNOSIS — S0990XA Unspecified injury of head, initial encounter: Secondary | ICD-10-CM | POA: Diagnosis not present

## 2018-02-25 DIAGNOSIS — K210 Gastro-esophageal reflux disease with esophagitis: Secondary | ICD-10-CM | POA: Diagnosis not present

## 2018-02-25 LAB — CBC WITH DIFFERENTIAL/PLATELET
Basophils Absolute: 0 K/uL (ref 0.0–0.1)
Basophils Relative: 0 %
Eosinophils Absolute: 0.1 K/uL (ref 0.0–0.7)
Eosinophils Relative: 1 %
HCT: 29.1 % — ABNORMAL LOW (ref 39.0–52.0)
Hemoglobin: 9.8 g/dL — ABNORMAL LOW (ref 13.0–17.0)
Lymphocytes Relative: 14 %
Lymphs Abs: 0.7 K/uL (ref 0.7–4.0)
MCH: 29.8 pg (ref 26.0–34.0)
MCHC: 33.7 g/dL (ref 30.0–36.0)
MCV: 88.4 fL (ref 78.0–100.0)
Monocytes Absolute: 0.5 K/uL (ref 0.1–1.0)
Monocytes Relative: 10 %
Neutro Abs: 3.7 K/uL (ref 1.7–7.7)
Neutrophils Relative %: 75 %
Platelets: 241 K/uL (ref 150–400)
RBC: 3.29 MIL/uL — ABNORMAL LOW (ref 4.22–5.81)
RDW: 23.1 % — ABNORMAL HIGH (ref 11.5–15.5)
WBC: 5 K/uL (ref 4.0–10.5)

## 2018-02-25 LAB — URINALYSIS, ROUTINE W REFLEX MICROSCOPIC
Bilirubin Urine: NEGATIVE
Glucose, UA: NEGATIVE mg/dL
HGB URINE DIPSTICK: NEGATIVE
Ketones, ur: NEGATIVE mg/dL
LEUKOCYTES UA: NEGATIVE
Nitrite: NEGATIVE
Protein, ur: NEGATIVE mg/dL
SPECIFIC GRAVITY, URINE: 1.004 — AB (ref 1.005–1.030)
pH: 7 (ref 5.0–8.0)

## 2018-02-25 LAB — COMPREHENSIVE METABOLIC PANEL
ALBUMIN: 2.7 g/dL — AB (ref 3.5–5.0)
ALT: 11 U/L (ref 0–44)
AST: 25 U/L (ref 15–41)
Alkaline Phosphatase: 146 U/L — ABNORMAL HIGH (ref 38–126)
Anion gap: 8 (ref 5–15)
BILIRUBIN TOTAL: 0.6 mg/dL (ref 0.3–1.2)
BUN: 15 mg/dL (ref 8–23)
CO2: 26 mmol/L (ref 22–32)
Calcium: 8.2 mg/dL — ABNORMAL LOW (ref 8.9–10.3)
Chloride: 93 mmol/L — ABNORMAL LOW (ref 98–111)
Creatinine, Ser: 0.54 mg/dL — ABNORMAL LOW (ref 0.61–1.24)
GFR calc Af Amer: >60 mL/min (ref >60)
GFR calc non Af Amer: >60 mL/min (ref >60)
Glucose, Bld: 100 mg/dL — ABNORMAL HIGH (ref 70–99)
POTASSIUM: 4.8 mmol/L (ref 3.5–5.1)
SODIUM: 127 mmol/L — AB (ref 135–145)
Total Protein: 6.1 g/dL — ABNORMAL LOW (ref 6.5–8.1)

## 2018-02-25 LAB — AMMONIA: Ammonia: 12 umol/L (ref 9–35)

## 2018-02-25 LAB — TSH: TSH: 1.483 u[IU]/mL (ref 0.350–4.500)

## 2018-02-25 LAB — I-STAT CG4 LACTIC ACID, ED: LACTIC ACID, VENOUS: 1.04 mmol/L (ref 0.5–1.9)

## 2018-02-25 LAB — LIPASE, BLOOD: Lipase: 22 U/L (ref 11–51)

## 2018-02-25 MED ORDER — IOPAMIDOL (ISOVUE-300) INJECTION 61%
100.0000 mL | Freq: Once | INTRAVENOUS | Status: AC | PRN
  Administered 2018-02-25: 100 mL via INTRAVENOUS

## 2018-02-25 MED ORDER — IOPAMIDOL (ISOVUE-300) INJECTION 61%
INTRAVENOUS | Status: AC
  Filled 2018-02-25: qty 100

## 2018-02-25 NOTE — ED Notes (Signed)
Right chest port de-accessed per protocal  After heparinized

## 2018-02-25 NOTE — ED Notes (Signed)
Pt to auto via w/c after medical clearance per Dr Zenia Resides condition stable . Denies pain is alert to norm per wife. Pt ambulated from stretcher to w/c gait steady with minor assist.

## 2018-02-25 NOTE — ED Notes (Signed)
Bed: WA13 Expected date:  Expected time:  Means of arrival:  Comments: 

## 2018-02-25 NOTE — ED Notes (Signed)
Pt Orthostatic from laying to sitting and pt unsure about standing at this time. V/S not taken with pt standing for safety.

## 2018-02-25 NOTE — ED Provider Notes (Signed)
Emergency Department Provider Note   I have reviewed the triage vital signs and the nursing notes.   HISTORY  Chief Complaint Weakness and Bloated   HPI Lucas Holloway is a 77 y.o. male with history of metastatic prostate cancer the presents to the emergency department today secondary to confusion.  Patient had a mechanical fall the other day and hit his head and ever since then has had intermittent episodes of confusion and then a nurse came over today is that his abdomen felt hard today talk to his primary doctor so told to come here for further evaluation.  Patient also complains of some right shoulder pain since the fall.  No urinary symptoms or respiratory symptoms.  No cardiac symptoms.  No other associated symptoms. No other associated or modifying symptoms.    Past Medical History:  Diagnosis Date  . Polyarticular psoriatic arthritis ([place])   . Prostate cancer [facility])     Patient Active Problem List   Diagnosis Date Noted  . Malnutrition of moderate degree 02/02/2018  . Hyponatremia 02/01/2018  . Symptomatic Anemia 01/07/2018  . Goals of care, counseling/discussion 11/23/2017  . Malignant neoplasm metastatic to bone ([place]) 02/09/2016  . Prostate cancer ([place]) 01/14/2016  . Pituitary macroadenoma ([place]) 11/04/2015    Past Surgical History:  Procedure Laterality Date  . ESOPHAGOGASTRODUODENOSCOPY Left 01/08/2018   Procedure: ESOPHAGOGASTRODUODENOSCOPY (EGD);  Surgeon: Carol Ada, MD;  Location: [facility];  Service: Endoscopy;  Laterality: Left;  . IR IMAGING GUIDED PORT INSERTION  11/28/2017  . radioactive seed implant prostate  2009    Current Outpatient Rx  . Order #: [id number] Class: Historical Med  . Order #: [id number] Class: Historical Med  . Order #: [id number] Class: Normal  . Order #: [id number] Class: Historical Med  . Order #: [id number] Class: Historical Med  . Order #: [id number] Class: Historical Med  . Order #: [id number] Class: Historical Med  . Order #:  [id number] Class: Normal  . Order #: [id number] Class: Historical Med  . Order #: [id number] Class: Historical Med  . Order #: [id number] Class: Normal  . Order #: [id number] Class: Historical Med  . Order #: [id number] Class: Historical Med  . Order #: [id number] Class: OTC  . Order #: [id number] Class: Normal  . Order #: [id number] Class: Normal    Allergies Patient has no known allergies.  Family History  Problem Relation Age of Onset  . Cancer Sister        Ovarian    Social History Social History   Tobacco Use  . Smoking status: Former Research scientist (life sciences)  . Smokeless tobacco: Never Used  Substance Use Topics  . Alcohol use: No    Alcohol/week: 0.0 standard drinks  . Drug use: No    Review of Systems  All other systems negative except as documented in the HPI. All pertinent positives and negatives as reviewed in the HPI. ____________________________________________   PHYSICAL EXAM:  VITAL SIGNS: ED Triage Vitals  Enc Vitals Group     BP 02/25/18 1405 130/70     Pulse Rate 02/25/18 1405 80     Resp 02/25/18 1408 18     Temp 02/25/18 1408 97.9 F (36.6 C)     Temp Source 02/25/18 1408 Oral     SpO2 02/25/18 1407 97 %    Constitutional: Alert and disoriented. Well appearing and in no acute distress. Eyes: Conjunctivae are normal. PERRL. EOMI. Head: Atraumatic. Nose: No congestion/rhinnorhea. Mouth/Throat: Mucous membranes are moist.  Oropharynx non-erythematous. Neck: No stridor.  No meningeal signs.   Cardiovascular: Normal rate, regular rhythm. Good peripheral circulation.  Grossly normal heart sounds.   Respiratory: Normal respiratory effort.  No retractions. Lungs CTAB. Gastrointestinal: mild guarding but nontender. No distention.  Musculoskeletal: No lower extremity tenderness nor edema. No gross deformities of extremities. Neurologic:  Normal speech and language. No gross focal neurologic deficits are appreciated.  Skin:  Skin is warm, dry and intact. No rash  noted.  ____________________________________________   LABS (all labs ordered are listed, but only abnormal results are displayed)  Labs Reviewed  COMPREHENSIVE METABOLIC PANEL - Abnormal; Notable for the following components:      Result Value   Sodium 127 (*)    Chloride 93 (*)    Glucose, Bld 100 (*)    Creatinine, Ser 0.54 (*)    Calcium 8.2 (*)    Total Protein 6.1 (*)    Albumin 2.7 (*)    Alkaline Phosphatase 146 (*)    All other components within normal limits  CBC WITH DIFFERENTIAL/PLATELET - Abnormal; Notable for the following components:   RBC 3.29 (*)    Hemoglobin 9.8 (*)    HCT 29.1 (*)    RDW 23.1 (*)    All other components within normal limits  URINE CULTURE  LIPASE, BLOOD  AMMONIA  CBC WITH DIFFERENTIAL/PLATELET  URINALYSIS, ROUTINE W REFLEX MICROSCOPIC  I-STAT CG4 LACTIC ACID, ED  I-STAT CG4 LACTIC ACID, ED   ____________________________________________  EKG   EKG Interpretation  Date/Time:  Monday February 25 2018 14:13:36 EDT Ventricular Rate:  84 PR Interval:    QRS Duration: 100 QT Interval:  369 QTC Calculation: 437 R Axis:   69 Text Interpretation:  Sinus rhythm Ventricular premature complex Minimal ST depression, lateral leads No significant change since last tracing Confirmed by Merrily Pew [phone]) on 02/25/2018 4:03:34 PM       ____________________________________________  RADIOLOGY  Dg Chest 2 View  Result Date: 02/25/2018 CLINICAL DATA:  Confusion, abdominal distension, last seen normal last night before bed, history prostate cancer EXAM: CHEST - 2 VIEW COMPARISON:  02/03/2008 Correlation: CT abdomen and pelvis 01/07/2018 FINDINGS: Normal heart size, mediastinal contours, and pulmonary vascularity. Atherosclerotic calcification aorta. Minimal atelectasis or effusions at the costophrenic angles bilaterally. Lungs otherwise clear. No infiltrate or pneumothorax. Tip of RIGHT jugular Port-A-Cath projects over SVC. Diffuse osseous  sclerosis compatible with known osseous metastatic disease. IMPRESSION: Minimal basilar atelectasis versus small pleural effusions. Sclerotic osseous metastases. Electronically Signed   By: Lavonia Dana M.D.   On: 02/25/2018 15:16   Dg Humerus Right  Result Date: 02/25/2018 CLINICAL DATA:  Confusion, last seen normal last night before bed EXAM: RIGHT HUMERUS - 2+ VIEW COMPARISON:  None FINDINGS: Osseous demineralization. Sclerosis at the proximal humerus as well as at the RIGHT glenoid question osseous metastases in patient with history of metastatic prostate cancer to bone. No acute fracture, dislocation or bone destruction. Radiocapitellar joint space narrowing noted. IMPRESSION: Degenerative changes RIGHT elbow. Question sclerotic osseous metastases at RIGHT proximal humerus and glenoid fossa. Electronically Signed   By: Lavonia Dana M.D.   On: 02/25/2018 15:18    ____________________________________________  INITIAL IMPRESSION / ASSESSMENT AND PLAN / ED COURSE  Will eval for infectious versus intracranial causes for his confusion.  Also possible like to light abnormalities as he has a history of in the past.  Patient's no acute distress this time I think if his work-up is negative he can probably be discharged.  Care transferred pending ct head/a/p.   Pertinent labs & imaging results that were available during my care of the  patient were reviewed by me and considered in my medical decision making (see chart for details).  ____________________________________________  FINAL CLINICAL IMPRESSION(S) / ED DIAGNOSES  Final diagnoses:  Confusion   Note:  This note was prepared with assistance of Dragon voice recognition software. Occasional wrong-word or sound-a-like substitutions may have occurred due to the inherent limitations of voice recognition software.   Merrily Pew, MD 02/25/18 [phone]

## 2018-02-25 NOTE — ED Notes (Signed)
Bed: TK24 Expected date:  Expected time:  Means of arrival:  Comments: EMS-confusion/abdominal distention

## 2018-02-25 NOTE — ED Notes (Signed)
Dr Zenia Resides into see pt . Abrasion left elbow cleaned with saline and drsg applied and abrasion to left eyebrow also cleaned with saline and left open to air. Pt denies neck or back pain ambulated with walker gait steady with device denies leg or arm pain and is alert to norm per wife.

## 2018-02-25 NOTE — Telephone Encounter (Signed)
Beth from [facility] called to report that she was at patient's home today. Patient's family states he fell at home last Thursday- walker got "caught on a chair" and he hit his head on glass door when he fell. Pt is complaining of right arm pain, became more confused on Saturday, has new swelling in legs and his abdomen is hard. They have instructed family to call EMS to take him to the ED.

## 2018-02-25 NOTE — ED Notes (Signed)
Patient ambulated in ED with front wheel walker and minimal assist. Patient tolerated well and MD has been notified.

## 2018-02-25 NOTE — ED Provider Notes (Addendum)
Medical screening examination/treatment/procedure(s) were conducted as a shared visit with non-physician practitioner(s) and myself.  I personally evaluated the patient during the encounter.  EKG Interpretation  Date/Time:  Monday February 25 2018 14:32:36 EDT Ventricular Rate:  78 PR Interval:    QRS Duration: 93 QT Interval:  374 QTC Calculation: 426 R Axis:   74 Text Interpretation:  Sinus rhythm No significant change since last tracing Confirmed by Merrily Pew [phone]) on 02/25/2018 4:62:43 PM 77 year old male presented with weakness after hitting his head.  Patient sent to me by Dr. Dayna Barker.  Patient's results were reviewed including abdominal as well as head CT is without acute findings.  Patient states that he feels better after IV fluids.  He is requesting a home at this time.  His relative in room agrees that he is back to his baseline.  Patient ambulated in the hallway with the use of a walker which is his baseline.  He is not orthostatic at time of discharge   Lacretia Leigh, MD 02/25/18 Marijo Sanes, MD 02/25/18 2027

## 2018-02-25 NOTE — ED Notes (Signed)
Pt found on floor in bathroom N0# 1E-210 near mini-lab pt states he fell attempting to change his pants. Pt was placed in BR and given call light cord and informed to pull when done so staff can assist but Mr Foglio was noncomplaint.

## 2018-02-25 NOTE — ED Triage Notes (Addendum)
Pt via EMS from home.  Wife called with complaints of pt being confused, abdominal distension. Last seen normal last night before bed. Normal BM last night.

## 2018-02-27 LAB — URINE CULTURE: Culture: <10000 — AB

## 2018-02-28 ENCOUNTER — Other Ambulatory Visit: Payer: Medicare Other

## 2018-02-28 ENCOUNTER — Inpatient Hospital Stay: Payer: Medicare Other

## 2018-02-28 ENCOUNTER — Telehealth: Payer: Self-pay | Admitting: *Deleted

## 2018-02-28 ENCOUNTER — Telehealth: Payer: Self-pay | Admitting: Nurse Practitioner

## 2018-02-28 ENCOUNTER — Encounter: Payer: Self-pay | Admitting: Nurse Practitioner

## 2018-02-28 ENCOUNTER — Encounter: Payer: Medicare Other | Admitting: Nutrition

## 2018-02-28 ENCOUNTER — Inpatient Hospital Stay (HOSPITAL_BASED_OUTPATIENT_CLINIC_OR_DEPARTMENT_OTHER): Payer: Medicare Other | Admitting: Nurse Practitioner

## 2018-02-28 ENCOUNTER — Ambulatory Visit: Payer: Medicare Other | Admitting: Oncology

## 2018-02-28 ENCOUNTER — Ambulatory Visit: Payer: Medicare Other

## 2018-02-28 VITALS — BP 112/56 | HR 82 | Temp 97.6°F | Resp 17 | Ht 67.5 in | Wt 139.3 lb

## 2018-02-28 DIAGNOSIS — Z95828 Presence of other vascular implants and grafts: Secondary | ICD-10-CM

## 2018-02-28 DIAGNOSIS — C7951 Secondary malignant neoplasm of bone: Secondary | ICD-10-CM

## 2018-02-28 DIAGNOSIS — Z192 Hormone resistant malignancy status: Secondary | ICD-10-CM | POA: Diagnosis not present

## 2018-02-28 DIAGNOSIS — Z5111 Encounter for antineoplastic chemotherapy: Secondary | ICD-10-CM | POA: Diagnosis not present

## 2018-02-28 DIAGNOSIS — K449 Diaphragmatic hernia without obstruction or gangrene: Secondary | ICD-10-CM | POA: Diagnosis not present

## 2018-02-28 DIAGNOSIS — D500 Iron deficiency anemia secondary to blood loss (chronic): Secondary | ICD-10-CM | POA: Diagnosis not present

## 2018-02-28 DIAGNOSIS — I959 Hypotension, unspecified: Secondary | ICD-10-CM | POA: Diagnosis not present

## 2018-02-28 DIAGNOSIS — D352 Benign neoplasm of pituitary gland: Secondary | ICD-10-CM | POA: Diagnosis not present

## 2018-02-28 DIAGNOSIS — R630 Anorexia: Secondary | ICD-10-CM

## 2018-02-28 DIAGNOSIS — D638 Anemia in other chronic diseases classified elsewhere: Secondary | ICD-10-CM | POA: Diagnosis not present

## 2018-02-28 DIAGNOSIS — C61 Malignant neoplasm of prostate: Secondary | ICD-10-CM | POA: Diagnosis not present

## 2018-02-28 DIAGNOSIS — Z79899 Other long term (current) drug therapy: Secondary | ICD-10-CM

## 2018-02-28 DIAGNOSIS — K210 Gastro-esophageal reflux disease with esophagitis: Secondary | ICD-10-CM | POA: Diagnosis not present

## 2018-02-28 LAB — CMP (CANCER CENTER ONLY)
ALK PHOS: 165 U/L — AB (ref 38–126)
ALT: 15 U/L (ref 0–44)
AST: 28 U/L (ref 15–41)
Albumin: 2.4 g/dL — ABNORMAL LOW (ref 3.5–5.0)
Anion gap: 6 (ref 5–15)
BILIRUBIN TOTAL: 0.3 mg/dL (ref 0.3–1.2)
BUN: 9 mg/dL (ref 8–23)
CALCIUM: 8.5 mg/dL — AB (ref 8.9–10.3)
CO2: 24 mmol/L (ref 22–32)
CREATININE: 0.59 mg/dL — AB (ref 0.61–1.24)
Chloride: 100 mmol/L (ref 98–111)
GFR, Estimated: >60 mL/min (ref >60)
Glucose, Bld: 92 mg/dL (ref 70–99)
Potassium: 4.3 mmol/L (ref 3.5–5.1)
Sodium: 130 mmol/L — ABNORMAL LOW (ref 135–145)
Total Protein: 5.6 g/dL — ABNORMAL LOW (ref 6.5–8.1)

## 2018-02-28 LAB — CBC WITH DIFFERENTIAL (CANCER CENTER ONLY)
BASOS PCT: 1 %
Basophils Absolute: 0 10*3/uL (ref 0.0–0.1)
EOS ABS: 0.1 10*3/uL (ref 0.0–0.5)
EOS PCT: 2 %
HCT: 27.1 % — ABNORMAL LOW (ref 38.4–49.9)
Hemoglobin: 9 g/dL — ABNORMAL LOW (ref 13.0–17.1)
Lymphocytes Relative: 18 %
Lymphs Abs: 0.7 10*3/uL — ABNORMAL LOW (ref 0.9–3.3)
MCH: 29.5 pg (ref 27.2–33.4)
MCHC: 33.2 g/dL (ref 32.0–36.0)
MCV: 88.7 fL (ref 79.3–98.0)
Monocytes Absolute: 0.5 10*3/uL (ref 0.1–0.9)
Monocytes Relative: 12 %
Neutro Abs: 2.9 10*3/uL (ref 1.5–6.5)
Neutrophils Relative %: 67 %
PLATELETS: 236 10*3/uL (ref 140–400)
RBC: 3.06 MIL/uL — ABNORMAL LOW (ref 4.20–5.82)
RDW: 25.1 % — AB (ref 11.0–14.6)
WBC Count: 4.2 10*3/uL (ref 4.0–10.3)

## 2018-02-28 MED ORDER — SODIUM CHLORIDE 0.9% FLUSH
10.0000 mL | INTRAVENOUS | Status: DC | PRN
  Administered 2018-02-28: 10 mL
  Filled 2018-02-28: qty 10

## 2018-02-28 MED ORDER — DEXAMETHASONE SODIUM PHOSPHATE 10 MG/ML IJ SOLN
INTRAMUSCULAR | Status: AC
  Filled 2018-02-28: qty 1

## 2018-02-28 MED ORDER — HEPARIN SOD (PORK) LOCK FLUSH 100 UNIT/ML IV SOLN
500.0000 [IU] | Freq: Once | INTRAVENOUS | Status: AC | PRN
  Administered 2018-02-28: 500 [IU]
  Filled 2018-02-28: qty 5

## 2018-02-28 MED ORDER — SODIUM CHLORIDE 0.9 % IV SOLN
Freq: Once | INTRAVENOUS | Status: AC
  Administered 2018-02-28: 13:00:00 via INTRAVENOUS
  Filled 2018-02-28: qty 250

## 2018-02-28 MED ORDER — SODIUM CHLORIDE 0.9 % IV SOLN
60.0000 mg/m2 | Freq: Once | INTRAVENOUS | Status: AC
  Administered 2018-02-28: 110 mg via INTRAVENOUS
  Filled 2018-02-28: qty 11

## 2018-02-28 MED ORDER — SODIUM CHLORIDE 0.9% FLUSH
10.0000 mL | Freq: Once | INTRAVENOUS | Status: AC
  Administered 2018-02-28: 10 mL
  Filled 2018-02-28: qty 10

## 2018-02-28 MED ORDER — DEXAMETHASONE SODIUM PHOSPHATE 10 MG/ML IJ SOLN
10.0000 mg | Freq: Once | INTRAMUSCULAR | Status: AC
  Administered 2018-02-28: 10 mg via INTRAVENOUS

## 2018-02-28 NOTE — Telephone Encounter (Signed)
Form filled out for request for a hospital bed from [facility] faxed to [phone]

## 2018-02-28 NOTE — Progress Notes (Signed)
[facility] OFFICE PROGRESS NOTE   Diagnosis: Castration resistant prostate cancer diagnosed in 2017.  Initial diagnosis 2009, Gleason score 6, PSA of 9.  Prior Therapy: He was treated with brachytherapy with excellent PSA response initially. His PSA nadir was 0.7 and 2013.   He developed metastases in October 2016 with bone metastasis. He was started on androgen deprivation under the care of Dr. [name] and his PSA did drop down initially to 0.61 in May 2017. He developed castration resistant disease in 2017. Zytiga 1000 mg daily with prednisone 5 mg daily started on 01/07/2016. Therapy discontinued and 2018 because of progression of disease. Xtandi 160 mg daily started in August 2018. The dose was reduced to 80 mg daily starting on 02/12/2017 for better tolerance.  Therapy discontinued in October 2018 because of poor tolerance and progression of disease. Xofigo infusion on a monthly basis started on May 04, 2017.  He completed 6 treatments in April 2019.  Current therapy:   Taxotere chemotherapy started on December 04, 2017.  He is status post 3 cycles of chemotherapy.  He is receiving Xgeva every 8 weeks.  This has been on hold because of dental procedures.  He is currently on Lupron at 30 mg every 4 months.  Last injection was given on December 04, 2017.  INTERVAL HISTORY:   Mr. Chapple returns as scheduled.  He completed cycle 3 Taxotere 01/17/2018.  Cycle 4 was held 02/07/2018.  His recent PSA on 02/07/2018 returned at 121 compared to 200 on 01/17/2018.  He was seen in the emergency department on 02/25/2018 with weakness/fall.  He received IV fluids and was discharged home.  He continues to be weak.  Some improvement in appetite.  He had trouble sleeping last night.  He denies nausea/vomiting.  No mouth sores.  No constipation or diarrhea.  No numbness or tingling in his hands or feet.  No pain.  Urinating without difficulty.  No hematuria.    Objective:  Vital signs in last  24 hours:  Blood pressure (!) 112/56, pulse 82, temperature 97.6 F (36.4 C), temperature source Oral, resp. rate 17, height 5' 7.5" (1.715 m), weight 139 lb 4.8 oz (63.2 kg), SpO2 100 %.    HEENT: No thrush or ulcers. Resp: Lungs clear bilaterally. Cardio: Regular rate and rhythm. GI: Abdomen soft and nontender.  No hepatomegaly. Vascular: No leg edema. Neuro: Moves all extremities.  Lower extremity motor strength intact. Skin: Resolving ecchymosis left forehead. Port-A-Cath without erythema.   Lab Results:  Lab Results  Component Value Date   WBC 4.2 02/28/2018   HGB 9.0 (L) 02/28/2018   HCT 27.1 (L) 02/28/2018   MCV 88.7 02/28/2018   PLT 236 02/28/2018   NEUTROABS 2.9 02/28/2018    Imaging:  No results found.  Medications: I have reviewed the patient's current medications.  Assessment/Plan: 1. Castration resistant prostate cancer diagnosed in 2017 and status post multiple therapies as outlined above.  He is status post 3 cycles of Taxotere chemotherapy with the most recent PSA improved. 2. Hypotension.  Unclear etiology.  Improved today. 3. Androgen deprivation.  Next Lupron due November 2019. 4. Anorexia.  Improved.  Weight overall stable. 5. IV access.  He has a Port-A-Cath. 6. Antiemetics.  He denies nausea/vomiting.  He has Compazine at home. 7. Growth factor support.  He receives white cell growth factor report. 8. Prognosis and goals of care.  The goal of therapy is palliative. 9. Anemia related to GI bleeding from an ulcer.  Hemoglobin overall remains stable.  Disposition: Mr. Fesperman has completed 3 cycles of Taxotere.  The most recent PSA was improved.  There has been no significant improvement in his performance status since beginning chemotherapy.  Dr. [name] discussed options to include continuation of chemotherapy versus supportive care.  Mr. Bunyard would like to continue the chemotherapy for now.  He will receive cycle 4 Taxotere today.  We will follow-up on  the PSA from today.  He will return for lab, follow-up and the next cycle of Taxotere in 3 weeks.  He will contact the office in the interim with any problems.  Patient seen with Dr. [name].    Ned Card ANP/GNP-BC   02/28/2018  1:22 PM

## 2018-02-28 NOTE — Telephone Encounter (Signed)
No 9/26  [place].

## 2018-03-01 ENCOUNTER — Ambulatory Visit: Payer: Medicare Other

## 2018-03-01 ENCOUNTER — Inpatient Hospital Stay: Payer: Medicare Other

## 2018-03-01 VITALS — BP 111/75 | HR 82 | Temp 97.8°F | Resp 16

## 2018-03-01 DIAGNOSIS — D500 Iron deficiency anemia secondary to blood loss (chronic): Secondary | ICD-10-CM | POA: Diagnosis not present

## 2018-03-01 DIAGNOSIS — C61 Malignant neoplasm of prostate: Secondary | ICD-10-CM | POA: Diagnosis not present

## 2018-03-01 DIAGNOSIS — C7951 Secondary malignant neoplasm of bone: Secondary | ICD-10-CM

## 2018-03-01 DIAGNOSIS — Z192 Hormone resistant malignancy status: Secondary | ICD-10-CM | POA: Diagnosis not present

## 2018-03-01 DIAGNOSIS — I959 Hypotension, unspecified: Secondary | ICD-10-CM | POA: Diagnosis not present

## 2018-03-01 DIAGNOSIS — Z5111 Encounter for antineoplastic chemotherapy: Secondary | ICD-10-CM | POA: Diagnosis not present

## 2018-03-01 LAB — PROSTATE-SPECIFIC AG, SERUM (LABCORP): Prostate Specific Ag, Serum: 161 ng/mL — ABNORMAL HIGH (ref 0.0–4.0)

## 2018-03-01 MED ORDER — PEGFILGRASTIM-CBQV 6 MG/0.6ML ~~LOC~~ SOSY
PREFILLED_SYRINGE | SUBCUTANEOUS | Status: AC
  Filled 2018-03-01: qty 0.6

## 2018-03-01 MED ORDER — PEGFILGRASTIM-CBQV 6 MG/0.6ML ~~LOC~~ SOSY
6.0000 mg | PREFILLED_SYRINGE | Freq: Once | SUBCUTANEOUS | Status: AC
  Administered 2018-03-01: 6 mg via SUBCUTANEOUS

## 2018-03-01 NOTE — Patient Instructions (Signed)
Pegfilgrastim injection What is this medicine? PEGFILGRASTIM (PEG fil gra stim) is a long-acting granulocyte colony-stimulating factor that stimulates the growth of neutrophils, a type of white blood cell important in the body's fight against infection. It is used to reduce the incidence of fever and infection in patients with certain types of cancer who are receiving chemotherapy that affects the bone marrow, and to increase survival after being exposed to high doses of radiation. This medicine may be used for other purposes; ask your health care provider or pharmacist if you have questions. COMMON BRAND NAME(S): Neulasta What should I tell my health care provider before I take this medicine? They need to know if you have any of these conditions: -kidney disease -latex allergy -ongoing radiation therapy -sickle cell disease -skin reactions to acrylic adhesives (On-Body Injector only) -an unusual or allergic reaction to pegfilgrastim, filgrastim, other medicines, foods, dyes, or preservatives -pregnant or trying to get pregnant -breast-feeding How should I use this medicine? This medicine is for injection under the skin. If you get this medicine at home, you will be taught how to prepare and give the pre-filled syringe or how to use the On-body Injector. Refer to the patient Instructions for Use for detailed instructions. Use exactly as directed. Tell your healthcare provider immediately if you suspect that the On-body Injector may not have performed as intended or if you suspect the use of the On-body Injector resulted in a missed or partial dose. It is important that you put your used needles and syringes in a special sharps container. Do not put them in a trash can. If you do not have a sharps container, call your pharmacist or healthcare provider to get one. Talk to your pediatrician regarding the use of this medicine in children. While this drug may be prescribed for selected conditions,  precautions do apply. Overdosage: If you think you have taken too much of this medicine contact a poison control center or emergency room at once. NOTE: This medicine is only for you. Do not share this medicine with others. What if I miss a dose? It is important not to miss your dose. Call your doctor or health care professional if you miss your dose. If you miss a dose due to an On-body Injector failure or leakage, a new dose should be administered as soon as possible using a single prefilled syringe for manual use. What may interact with this medicine? Interactions have not been studied. Give your health care provider a list of all the medicines, herbs, non-prescription drugs, or dietary supplements you use. Also tell them if you smoke, drink alcohol, or use illegal drugs. Some items may interact with your medicine. This list may not describe all possible interactions. Give your health care provider a list of all the medicines, herbs, non-prescription drugs, or dietary supplements you use. Also tell them if you smoke, drink alcohol, or use illegal drugs. Some items may interact with your medicine. What should I watch for while using this medicine? You may need blood work done while you are taking this medicine. If you are going to need a MRI, CT scan, or other procedure, tell your doctor that you are using this medicine (On-Body Injector only). What side effects may I notice from receiving this medicine? Side effects that you should report to your doctor or health care professional as soon as possible: -allergic reactions like skin rash, itching or hives, swelling of the face, lips, or tongue -dizziness -fever -pain, redness, or irritation at site   where injected -pinpoint red spots on the skin -red or dark-brown urine -shortness of breath or breathing problems -stomach or side pain, or pain at the shoulder -swelling -tiredness -trouble passing urine or change in the amount of urine Side  effects that usually do not require medical attention (report to your doctor or health care professional if they continue or are bothersome): -bone pain -muscle pain This list may not describe all possible side effects. Call your doctor for medical advice about side effects. You may report side effects to FDA at 1-800-FDA-1088. Where should I keep my medicine? Keep out of the reach of children. Store pre-filled syringes in a refrigerator between 2 and 8 degrees C (36 and 46 degrees F). Do not freeze. Keep in carton to protect from light. Throw away this medicine if it is left out of the refrigerator for more than 48 hours. Throw away any unused medicine after the expiration date. NOTE: This sheet is a summary. It may not cover all possible information. If you have questions about this medicine, talk to your doctor, pharmacist, or health care provider.  2018 Elsevier/Gold Standard (2016-05-18 12:58:03)  

## 2018-03-04 DIAGNOSIS — Z6822 Body mass index (BMI) 22.0-22.9, adult: Secondary | ICD-10-CM | POA: Diagnosis not present

## 2018-03-04 DIAGNOSIS — C61 Malignant neoplasm of prostate: Secondary | ICD-10-CM | POA: Diagnosis not present

## 2018-03-04 DIAGNOSIS — R634 Abnormal weight loss: Secondary | ICD-10-CM | POA: Diagnosis not present

## 2018-03-04 DIAGNOSIS — L03115 Cellulitis of right lower limb: Secondary | ICD-10-CM | POA: Diagnosis not present

## 2018-03-05 DIAGNOSIS — D638 Anemia in other chronic diseases classified elsewhere: Secondary | ICD-10-CM | POA: Diagnosis not present

## 2018-03-05 DIAGNOSIS — K449 Diaphragmatic hernia without obstruction or gangrene: Secondary | ICD-10-CM | POA: Diagnosis not present

## 2018-03-05 DIAGNOSIS — D352 Benign neoplasm of pituitary gland: Secondary | ICD-10-CM | POA: Diagnosis not present

## 2018-03-05 DIAGNOSIS — C61 Malignant neoplasm of prostate: Secondary | ICD-10-CM | POA: Diagnosis not present

## 2018-03-05 DIAGNOSIS — C7951 Secondary malignant neoplasm of bone: Secondary | ICD-10-CM | POA: Diagnosis not present

## 2018-03-05 DIAGNOSIS — K210 Gastro-esophageal reflux disease with esophagitis: Secondary | ICD-10-CM | POA: Diagnosis not present

## 2018-03-08 DIAGNOSIS — L03115 Cellulitis of right lower limb: Secondary | ICD-10-CM | POA: Diagnosis not present

## 2018-03-12 ENCOUNTER — Other Ambulatory Visit: Payer: Self-pay | Admitting: Oncology

## 2018-03-12 DIAGNOSIS — C7951 Secondary malignant neoplasm of bone: Secondary | ICD-10-CM | POA: Diagnosis not present

## 2018-03-12 DIAGNOSIS — D352 Benign neoplasm of pituitary gland: Secondary | ICD-10-CM | POA: Diagnosis not present

## 2018-03-12 DIAGNOSIS — D638 Anemia in other chronic diseases classified elsewhere: Secondary | ICD-10-CM | POA: Diagnosis not present

## 2018-03-12 DIAGNOSIS — K210 Gastro-esophageal reflux disease with esophagitis: Secondary | ICD-10-CM | POA: Diagnosis not present

## 2018-03-12 DIAGNOSIS — C61 Malignant neoplasm of prostate: Secondary | ICD-10-CM | POA: Diagnosis not present

## 2018-03-12 DIAGNOSIS — K449 Diaphragmatic hernia without obstruction or gangrene: Secondary | ICD-10-CM | POA: Diagnosis not present

## 2018-03-13 ENCOUNTER — Telehealth: Payer: Self-pay | Admitting: *Deleted

## 2018-03-13 NOTE — Telephone Encounter (Signed)
Faxed signed order for decubitus care pad (gel) to [facility] [phone] attn: Caryl Pina

## 2018-03-19 ENCOUNTER — Telehealth: Payer: Self-pay | Admitting: Licensed Clinical Social Worker

## 2018-03-19 NOTE — Telephone Encounter (Signed)
Palliative Care SW spoke with patient's wife, Velva Harman, and scheduled a home visit for Thursday, 10/24, at 12:00.

## 2018-03-21 ENCOUNTER — Inpatient Hospital Stay: Payer: Medicare Other

## 2018-03-21 ENCOUNTER — Inpatient Hospital Stay: Payer: Medicare Other | Admitting: Oncology

## 2018-03-23 ENCOUNTER — Inpatient Hospital Stay: Payer: Medicare Other

## 2018-03-28 ENCOUNTER — Other Ambulatory Visit: Payer: Medicare Other | Admitting: Licensed Clinical Social Worker

## 2018-03-28 ENCOUNTER — Other Ambulatory Visit: Payer: Medicare Other | Admitting: *Deleted

## 2018-03-28 DIAGNOSIS — Z515 Encounter for palliative care: Secondary | ICD-10-CM

## 2018-03-28 DIAGNOSIS — E039 Hypothyroidism, unspecified: Secondary | ICD-10-CM | POA: Diagnosis not present

## 2018-03-28 DIAGNOSIS — D649 Anemia, unspecified: Secondary | ICD-10-CM | POA: Diagnosis not present

## 2018-03-29 NOTE — Progress Notes (Signed)
COMMUNITY PALLIATIVE CARE SW NOTE  PATIENT NAME: Lucas Holloway DOB: April 14, 1941 MRN: [id number]  PRIMARY CARE PROVIDER: [name], MD  RESPONSIBLE PARTY:  Acct ID - Guarantor Home Phone Work Phone Relationship Acct Type  [id number] - Brutus,Apolonio [phone] Self P/F     [address], [place], [place] [address]     PLAN OF CARE and INTERVENTIONS:             1. GOALS OF CARE/ ADVANCE CARE PLANNING:  Patient wishes to remain in his home with no further hospitalizations.  Patient has a DNR and MOST form. 2. SOCIAL/EMOTIONAL/SPIRITUAL ASSESSMENT/ INTERVENTIONS:  SW and Palliative Care RN, Daryl Eastern, met with patient and his wife, Velva Harman.  Patient was in his electric bed in the living room.  Their two small dogs spend most of the day on the bed with him and are good companions.  Patient was born in [place] and lived in [place].  He was in the construction business for 42 years.  He moved to [place] for work.  He has a son and daughter from his first marriage and and an adopted daughter who visits regularly. 3. PATIENT/CAREGIVER EDUCATION/ COPING:  Both patient and his wife cope by expressing their feelings openly.  Provided education regarding the Palliative Care Program. 4. PERSONAL EMERGENCY PLAN:  When patient has fallen in the past, a neighbor helps life patient back into bed.  His wife will call EMS if needed. 5. COMMUNITY RESOURCES COORDINATION/ HEALTH CARE NAVIGATION:  None. 6. FINANCIAL/LEGAL CONCERNS/INTERVENTIONS:  None.     SOCIAL HX:  Social History   Tobacco Use  . Smoking status: Former Research scientist (life sciences)  . Smokeless tobacco: Never Used  Substance Use Topics  . Alcohol use: No    Alcohol/week: 0.0 standard drinks    CODE STATUS:  DNR  ADVANCED DIRECTIVES: N MOST FORM COMPLETE:  Y HOSPICE EDUCATION PROVIDED:  N PPS:  Patient reports he gets full very easily.  He has not gotten out of bed since he fell earlier this week. Duration of visit and documentation:  75  minutes.      Creola Corn Zury Fazzino, LCSW

## 2018-04-01 NOTE — Progress Notes (Signed)
COMMUNITY PALLIATIVE CARE RN NOTE  PATIENT NAME: Lucas Holloway DOB: 04-18-1941 MRN: [id number]  PRIMARY CARE PROVIDER: [name], MD  RESPONSIBLE PARTY:  Acct ID - Guarantor Home Phone Work Phone Relationship Acct Type  [id number] - Lucas Holloway [phone] Self P/F     [name], [name], [name] [address]    PLAN OF CARE and INTERVENTION:  1. ADVANCE CARE PLANNING/GOALS OF CARE: Remain at home and avoid hospitalizations 2. PATIENT/CAREGIVER EDUCATION: Reinforced Safety and Fall Precautions 3. DISEASE STATUS: Joint visit made with Palliative Care SW, Lucas Holloway. Met with patient and wife in their home. Patient lying in bed throughout visit. Hospital bed is in the living room. He is pleasant, engaging and able to answer questions appropriately. Denies pain during visit, other than grimacing noted when dogs would jump on the bed and onto patient's legs. Wife is concerned with patient's frequent falls lately. He had a fall this week getting out of the bed, trying to take a few steps to reach his walker. Wife suspects he stood up too quickly, causing him to fall. He hit the side of his R head on the chair close to the bed. He reports being sore all over the initial 2 days following the fall. Also reported R shoulder pain. Wife reports that he used to be able to use his walker and ambulate a short distance into the kitchen, however has been unable to perform this activity in the past few days. She is also concerned that he does not drink enough and his intake being poor. He is currently receiving an infusion for his prostate cancer, however missed the last treatment as he was unable to attend due to weakness and not feeling well. He had labs drawn this week and has an appointment with his MD to discuss lab results next week. He requires assistance with bathing and dressing. He is now having difficulty ambulating, even with a walker. Wife also reports issues with low BPs and monitors this  when patient does not feel well. Will continue to monitor.   HISTORY OF PRESENT ILLNESS:  This is a 77 yo male who resides at home with his wife. Palliative Care Team was asked to follow patient. Next visit scheduled in 3 weeks.   CODE STATUS: DNR ADVANCED DIRECTIVES: N MOST FORM: yes PPS: 40%   PHYSICAL EXAM:   VITALS: Today's Vitals   03/28/18 1242  BP: (!) 108/58  Pulse: 81  Resp: 16  SpO2: 97%  PainSc: 0-No pain    LUNGS: clear to auscultation  CARDIAC: Cor RRR EXTREMITIES: No edema SKIN: Pale appearance, exposed skin is dry and intact  NEURO: Alert and oriented x 3, pleasant mood, increased generalized weakness, limited ambulation with walker   (Duration of visit and documentation 90 minutes)    Lucas Eastern, RN, BSN

## 2018-04-08 ENCOUNTER — Other Ambulatory Visit: Payer: BLUE CROSS/BLUE SHIELD | Admitting: Licensed Clinical Social Worker

## 2018-04-08 ENCOUNTER — Other Ambulatory Visit: Payer: Medicare Other | Admitting: *Deleted

## 2018-04-08 DIAGNOSIS — Z515 Encounter for palliative care: Secondary | ICD-10-CM

## 2018-04-08 NOTE — Progress Notes (Signed)
COMMUNITY PALLIATIVE CARE RN NOTE  PATIENT NAME: Lucas Holloway DOB: 24-Sep-1940 MRN: [id number]  PRIMARY CARE PROVIDER: [name], MD  RESPONSIBLE PARTY:  Acct ID - Guarantor Home Phone Work Phone Relationship Acct Type  [id number] - Mirando,Romaine [phone] Self P/F     [address], [place], [place] [address]    PLAN OF CARE and INTERVENTION:  1. ADVANCE CARE PLANNING/GOALS OF CARE: Remain at home with his wife and avoid going to the hospital 2. PATIENT/CAREGIVER EDUCATION: Reinforced Safe Transfers and Fall Prevention 3. DISEASE STATUS: Joint visit made with Palliative Care SW, Lynn Duffy. Met with patient and his wife in their home. He is lying down in supine position with his eyes closed. Easily aroused. He remains able to answer questions, but is much less communicative this visit, as he was falling asleep on and off throughout the visit. Wife reports that he is progressively weaker and sleeps most of the time. His voice is also much weaker and hoarse sounding at time. Last week, after transferring patient to the chair in the living room she went out to an exercise class. When she returned, he was sitting in the chair with his head down. She noticed that he had vomited and was extremely weak. She had to call a neighbor to assist him back to bed. Also last night, when she was attempting to assist patient back to bed, his legs gave out and she lowered him down where he was on his knees. The neighbor again came over to help get him back in bed. About 3-4 weeks ago, he was able to ambulate using his walker but now requires 1-2 person assistance with all transfers. His legs are unable to bear much weight. She is no longer able to get him into the shower and is now giving bed baths. His intake is poor. He reports not having much of an appetite. He has Ensure/Boost for nutritional supplementation. He is visibly thinner overall and cachectic in appearance. His skin is pale. She is no  longer able to get patient in the car to attend MD appointments. He has missed 2 appointments. He is now incontinent of bladder and is wearing Depends. She is having difficulty caring for him without help. They have long term care insurance, so wife is going to look into this to have hired help. Provided information about hospice services, as I feel that patient now meets criteria. Both wife and patient are agreeable. [place] Director who advised that patient appears eligible and to obtain order from Dr. [name] (Oncologist). Left voicemail with Dr. Hazeline Junker nurse. Seth Bake, RN contacted our Clinical Navigator Maxwell Caul to advise that Dr. [name] is out of the office for the evening but should return tomorrow, however she was still going to message him today. May not receive an order until tomorrow 04/09/18. Contacted wife to make aware. Will contact wife again once order is received. Will continue to monitor.   HISTORY OF PRESENT ILLNESS:  This is a 77 yo male who resides at home with his wife. Palliative Care continues to follow patient until order for hospice is received.  CODE STATUS: DNR ADVANCED DIRECTIVES: N MOST FORM: yes PPS: 30%   PHYSICAL EXAM:   VITALS: Today's Vitals   04/08/18 1235  BP: 102/60  Pulse: 84  Resp: 16  Temp: 98.5 F (36.9 C)  TempSrc: Temporal  SpO2: 97%  PainSc: 0-No pain    LUNGS: clear to auscultation  CARDIAC: Cor RRR EXTREMITIES: No  edema SKIN: Pale in appearance, thin/frail skin  NEURO: Alert and oriented x 3, increased generalized weakness, non-ambulatory   (Duration of visit and documentation 90 minutes)    Daryl Eastern, RN, BSN

## 2018-04-08 NOTE — Progress Notes (Signed)
COMMUNITY PALLIATIVE CARE SW NOTE  PATIENT NAME: Lucas Holloway DOB: [birth year] MRN: [id number]  PRIMARY CARE PROVIDER: [name], MD  RESPONSIBLE PARTY:  Acct ID - Guarantor Home Phone Work Phone Relationship Acct Type  [id number] - Whiteside,Mical [phone] [id number] Self P/F     [place], [place] [address]     PLAN OF CARE and INTERVENTIONS:             1. GOALS OF CARE/ ADVANCE CARE PLANNING:  Patient's goal is to remain at home with is wife and to not be hospitalized.  He has a DNR and MOST form. 2. SOCIAL/EMOTIONAL/SPIRITUAL ASSESSMENT/ INTERVENTIONS:  SW and Palliative Care RN, Daryl Eastern, met with patient and his wife, Velva Harman, in their home.  Velva Harman had phoned RN due to a change in patient's condition.  Patient is more lethargic.  He is not able to bare weight and had two recent falls.  SW provided active listening and supportive counseling while Velva Harman talked about how difficult care giving is in his condition.  She has difficulty changing his sheets and depends.  He responded periodically to his wife during the visit.  He denied pain and stated his legs were very week.  After providing education regarding Hospice, Velva Harman stated she would like to proceed with the referral process.  RN to contact MD's. 3. PATIENT/CAREGIVER EDUCATION/ COPING:  Both patient and his wife express their feelings openly. 4. PERSONAL EMERGENCY PLAN:  Patient's neighbors assist in getting patient back in the bed after a fall.  Velva Harman will call contact EMS if needed. 5. COMMUNITY RESOURCES COORDINATION/ HEALTH CARE NAVIGATION:  None at present.  Velva Harman plans on contacting a home health agency to assist patient with bathing and possible meal prep. 6. FINANCIAL/LEGAL CONCERNS/INTERVENTIONS:  Patient has LTC insurance.     SOCIAL HX:  Social History   Tobacco Use  . Smoking status: Former Research scientist (life sciences)  . Smokeless tobacco: Never Used  Substance Use Topics  . Alcohol use: No    Alcohol/week: 0.0 standard  drinks    CODE STATUS:  DNR  ADVANCED DIRECTIVES: N MOST FORM COMPLETE:  Y HOSPICE EDUCATION PROVIDED:  SW and RN provided education regarding Hospice. PPS:   Patient's appetite has decreased significantly this week.  He is no longer able to stand independently. Duration of visit and documentation:  75 minutes.      Creola Corn Haset [place], LCSW

## 2018-04-09 ENCOUNTER — Telehealth: Payer: Self-pay | Admitting: *Deleted

## 2018-04-09 NOTE — Telephone Encounter (Signed)
Received a return phone call from Lucas Mary Jane, RN with Dr. Hazeline Junker office to give verbal order for a hospice consult for patient and that he agrees to be patient's Attending Physician under hospice care as well.

## 2018-04-09 NOTE — Telephone Encounter (Signed)
Contacted wife Velva Harman and left a voicemail to advise that the order for hospice consult was received and Hospice referral center will contact her to arrange date/time of AV.

## 2018-04-09 NOTE — Telephone Encounter (Signed)
Spoke with nurse Lorin Mercy at [facility]. [phone]. Request for hospice okay per dr [name], he will be the attending and hospice physicians may do symptom management and have DNR signed per the families wishes.

## 2018-04-10 ENCOUNTER — Telehealth: Payer: Self-pay | Admitting: *Deleted

## 2018-04-10 NOTE — Telephone Encounter (Signed)
I have recommended hospice enrollment at this time.  He is not a candidate for any further chemotherapy.  This will be communicated to the patient as well.

## 2018-04-10 NOTE — Telephone Encounter (Signed)
Received TC from Tiajuana Amass, RN with [facility]. She states she did receive hospice referral for pt but wife and patient were worried that Dr. [name] did not want to give pt any more chemo.  Pt was too weak for treatment last month and a palliative care referral was done. Pt remains weak but wants to come in to see Dr. [name] re: goals of care. Butch Penny states that wife needs help getting pt here to the clinic and has a neighbor that can help. May need assist with transport. Discussed the above with Dixie, RN with Dr. [name].

## 2018-04-10 NOTE — Telephone Encounter (Signed)
Spoke with wife Velva Harman. Per dr [name], he would be happy to see mr Obryant and speak with him, but his recommendation still stands. He still recommends hospice and will not offer chemotherapy at this time. Velva Harman verbalized understanding. She states she will call hospice.

## 2018-04-11 DIAGNOSIS — C61 Malignant neoplasm of prostate: Secondary | ICD-10-CM | POA: Diagnosis not present

## 2018-04-11 DIAGNOSIS — K219 Gastro-esophageal reflux disease without esophagitis: Secondary | ICD-10-CM | POA: Diagnosis not present

## 2018-04-11 DIAGNOSIS — E46 Unspecified protein-calorie malnutrition: Secondary | ICD-10-CM | POA: Diagnosis not present

## 2018-04-11 DIAGNOSIS — E039 Hypothyroidism, unspecified: Secondary | ICD-10-CM | POA: Diagnosis not present

## 2018-04-11 DIAGNOSIS — D649 Anemia, unspecified: Secondary | ICD-10-CM | POA: Diagnosis not present

## 2018-04-11 DIAGNOSIS — L4050 Arthropathic psoriasis, unspecified: Secondary | ICD-10-CM | POA: Diagnosis not present

## 2018-04-12 DIAGNOSIS — E039 Hypothyroidism, unspecified: Secondary | ICD-10-CM | POA: Diagnosis not present

## 2018-04-12 DIAGNOSIS — L4050 Arthropathic psoriasis, unspecified: Secondary | ICD-10-CM | POA: Diagnosis not present

## 2018-04-12 DIAGNOSIS — E46 Unspecified protein-calorie malnutrition: Secondary | ICD-10-CM | POA: Diagnosis not present

## 2018-04-12 DIAGNOSIS — D649 Anemia, unspecified: Secondary | ICD-10-CM | POA: Diagnosis not present

## 2018-04-12 DIAGNOSIS — K219 Gastro-esophageal reflux disease without esophagitis: Secondary | ICD-10-CM | POA: Diagnosis not present

## 2018-04-12 DIAGNOSIS — C61 Malignant neoplasm of prostate: Secondary | ICD-10-CM | POA: Diagnosis not present

## 2018-04-15 DIAGNOSIS — L4050 Arthropathic psoriasis, unspecified: Secondary | ICD-10-CM | POA: Diagnosis not present

## 2018-04-15 DIAGNOSIS — D649 Anemia, unspecified: Secondary | ICD-10-CM | POA: Diagnosis not present

## 2018-04-15 DIAGNOSIS — C61 Malignant neoplasm of prostate: Secondary | ICD-10-CM | POA: Diagnosis not present

## 2018-04-15 DIAGNOSIS — E46 Unspecified protein-calorie malnutrition: Secondary | ICD-10-CM | POA: Diagnosis not present

## 2018-04-15 DIAGNOSIS — K219 Gastro-esophageal reflux disease without esophagitis: Secondary | ICD-10-CM | POA: Diagnosis not present

## 2018-04-15 DIAGNOSIS — E039 Hypothyroidism, unspecified: Secondary | ICD-10-CM | POA: Diagnosis not present

## 2018-04-16 DIAGNOSIS — E46 Unspecified protein-calorie malnutrition: Secondary | ICD-10-CM | POA: Diagnosis not present

## 2018-04-16 DIAGNOSIS — E039 Hypothyroidism, unspecified: Secondary | ICD-10-CM | POA: Diagnosis not present

## 2018-04-16 DIAGNOSIS — D649 Anemia, unspecified: Secondary | ICD-10-CM | POA: Diagnosis not present

## 2018-04-16 DIAGNOSIS — K219 Gastro-esophageal reflux disease without esophagitis: Secondary | ICD-10-CM | POA: Diagnosis not present

## 2018-04-16 DIAGNOSIS — C61 Malignant neoplasm of prostate: Secondary | ICD-10-CM | POA: Diagnosis not present

## 2018-04-16 DIAGNOSIS — L4050 Arthropathic psoriasis, unspecified: Secondary | ICD-10-CM | POA: Diagnosis not present

## 2018-04-17 DIAGNOSIS — K219 Gastro-esophageal reflux disease without esophagitis: Secondary | ICD-10-CM | POA: Diagnosis not present

## 2018-04-17 DIAGNOSIS — E039 Hypothyroidism, unspecified: Secondary | ICD-10-CM | POA: Diagnosis not present

## 2018-04-17 DIAGNOSIS — L4050 Arthropathic psoriasis, unspecified: Secondary | ICD-10-CM | POA: Diagnosis not present

## 2018-04-17 DIAGNOSIS — D649 Anemia, unspecified: Secondary | ICD-10-CM | POA: Diagnosis not present

## 2018-04-17 DIAGNOSIS — C61 Malignant neoplasm of prostate: Secondary | ICD-10-CM | POA: Diagnosis not present

## 2018-04-17 DIAGNOSIS — E46 Unspecified protein-calorie malnutrition: Secondary | ICD-10-CM | POA: Diagnosis not present

## 2018-04-18 DIAGNOSIS — D649 Anemia, unspecified: Secondary | ICD-10-CM | POA: Diagnosis not present

## 2018-04-18 DIAGNOSIS — K219 Gastro-esophageal reflux disease without esophagitis: Secondary | ICD-10-CM | POA: Diagnosis not present

## 2018-04-18 DIAGNOSIS — E46 Unspecified protein-calorie malnutrition: Secondary | ICD-10-CM | POA: Diagnosis not present

## 2018-04-18 DIAGNOSIS — C61 Malignant neoplasm of prostate: Secondary | ICD-10-CM | POA: Diagnosis not present

## 2018-04-18 DIAGNOSIS — L4050 Arthropathic psoriasis, unspecified: Secondary | ICD-10-CM | POA: Diagnosis not present

## 2018-04-18 DIAGNOSIS — E039 Hypothyroidism, unspecified: Secondary | ICD-10-CM | POA: Diagnosis not present

## 2018-04-19 DIAGNOSIS — D649 Anemia, unspecified: Secondary | ICD-10-CM | POA: Diagnosis not present

## 2018-04-19 DIAGNOSIS — L4050 Arthropathic psoriasis, unspecified: Secondary | ICD-10-CM | POA: Diagnosis not present

## 2018-04-19 DIAGNOSIS — C61 Malignant neoplasm of prostate: Secondary | ICD-10-CM | POA: Diagnosis not present

## 2018-04-19 DIAGNOSIS — K219 Gastro-esophageal reflux disease without esophagitis: Secondary | ICD-10-CM | POA: Diagnosis not present

## 2018-04-19 DIAGNOSIS — E039 Hypothyroidism, unspecified: Secondary | ICD-10-CM | POA: Diagnosis not present

## 2018-04-19 DIAGNOSIS — E46 Unspecified protein-calorie malnutrition: Secondary | ICD-10-CM | POA: Diagnosis not present

## 2018-04-22 ENCOUNTER — Telehealth: Payer: Self-pay

## 2018-04-22 DIAGNOSIS — E039 Hypothyroidism, unspecified: Secondary | ICD-10-CM | POA: Diagnosis not present

## 2018-04-22 DIAGNOSIS — K219 Gastro-esophageal reflux disease without esophagitis: Secondary | ICD-10-CM | POA: Diagnosis not present

## 2018-04-22 DIAGNOSIS — C61 Malignant neoplasm of prostate: Secondary | ICD-10-CM | POA: Diagnosis not present

## 2018-04-22 DIAGNOSIS — E46 Unspecified protein-calorie malnutrition: Secondary | ICD-10-CM | POA: Diagnosis not present

## 2018-04-22 DIAGNOSIS — D649 Anemia, unspecified: Secondary | ICD-10-CM | POA: Diagnosis not present

## 2018-04-22 DIAGNOSIS — L4050 Arthropathic psoriasis, unspecified: Secondary | ICD-10-CM | POA: Diagnosis not present

## 2018-04-22 NOTE — Telephone Encounter (Signed)
Received call from hospice RN Almyra Free requesting a prescription for liquid morphine for pain and comfort management. Explained that Dr. [name] is not available the rest of the day and could hospice MD order for symptom management. Almyra Free stated that she will obtain order from hospice MD. She also stated that the patient po intake has decreased and the increased pain is related to a contracted right leg.

## 2018-04-23 DIAGNOSIS — L4050 Arthropathic psoriasis, unspecified: Secondary | ICD-10-CM | POA: Diagnosis not present

## 2018-04-23 DIAGNOSIS — D649 Anemia, unspecified: Secondary | ICD-10-CM | POA: Diagnosis not present

## 2018-04-23 DIAGNOSIS — E039 Hypothyroidism, unspecified: Secondary | ICD-10-CM | POA: Diagnosis not present

## 2018-04-23 DIAGNOSIS — C61 Malignant neoplasm of prostate: Secondary | ICD-10-CM | POA: Diagnosis not present

## 2018-04-23 DIAGNOSIS — K219 Gastro-esophageal reflux disease without esophagitis: Secondary | ICD-10-CM | POA: Diagnosis not present

## 2018-04-23 DIAGNOSIS — E46 Unspecified protein-calorie malnutrition: Secondary | ICD-10-CM | POA: Diagnosis not present

## 2018-04-26 DIAGNOSIS — D649 Anemia, unspecified: Secondary | ICD-10-CM | POA: Diagnosis not present

## 2018-04-26 DIAGNOSIS — K219 Gastro-esophageal reflux disease without esophagitis: Secondary | ICD-10-CM | POA: Diagnosis not present

## 2018-04-26 DIAGNOSIS — E46 Unspecified protein-calorie malnutrition: Secondary | ICD-10-CM | POA: Diagnosis not present

## 2018-04-26 DIAGNOSIS — L4050 Arthropathic psoriasis, unspecified: Secondary | ICD-10-CM | POA: Diagnosis not present

## 2018-04-26 DIAGNOSIS — C61 Malignant neoplasm of prostate: Secondary | ICD-10-CM | POA: Diagnosis not present

## 2018-04-26 DIAGNOSIS — E039 Hypothyroidism, unspecified: Secondary | ICD-10-CM | POA: Diagnosis not present

## 2018-04-29 DIAGNOSIS — K219 Gastro-esophageal reflux disease without esophagitis: Secondary | ICD-10-CM | POA: Diagnosis not present

## 2018-04-29 DIAGNOSIS — C61 Malignant neoplasm of prostate: Secondary | ICD-10-CM | POA: Diagnosis not present

## 2018-04-29 DIAGNOSIS — E46 Unspecified protein-calorie malnutrition: Secondary | ICD-10-CM | POA: Diagnosis not present

## 2018-04-29 DIAGNOSIS — D649 Anemia, unspecified: Secondary | ICD-10-CM | POA: Diagnosis not present

## 2018-04-29 DIAGNOSIS — L4050 Arthropathic psoriasis, unspecified: Secondary | ICD-10-CM | POA: Diagnosis not present

## 2018-04-29 DIAGNOSIS — E039 Hypothyroidism, unspecified: Secondary | ICD-10-CM | POA: Diagnosis not present

## 2018-04-30 DIAGNOSIS — K219 Gastro-esophageal reflux disease without esophagitis: Secondary | ICD-10-CM | POA: Diagnosis not present

## 2018-04-30 DIAGNOSIS — E039 Hypothyroidism, unspecified: Secondary | ICD-10-CM | POA: Diagnosis not present

## 2018-04-30 DIAGNOSIS — E46 Unspecified protein-calorie malnutrition: Secondary | ICD-10-CM | POA: Diagnosis not present

## 2018-04-30 DIAGNOSIS — D649 Anemia, unspecified: Secondary | ICD-10-CM | POA: Diagnosis not present

## 2018-04-30 DIAGNOSIS — C61 Malignant neoplasm of prostate: Secondary | ICD-10-CM | POA: Diagnosis not present

## 2018-04-30 DIAGNOSIS — L4050 Arthropathic psoriasis, unspecified: Secondary | ICD-10-CM | POA: Diagnosis not present

## 2018-05-01 DIAGNOSIS — E46 Unspecified protein-calorie malnutrition: Secondary | ICD-10-CM | POA: Diagnosis not present

## 2018-05-01 DIAGNOSIS — D649 Anemia, unspecified: Secondary | ICD-10-CM | POA: Diagnosis not present

## 2018-05-01 DIAGNOSIS — C61 Malignant neoplasm of prostate: Secondary | ICD-10-CM | POA: Diagnosis not present

## 2018-05-01 DIAGNOSIS — L4050 Arthropathic psoriasis, unspecified: Secondary | ICD-10-CM | POA: Diagnosis not present

## 2018-05-01 DIAGNOSIS — E039 Hypothyroidism, unspecified: Secondary | ICD-10-CM | POA: Diagnosis not present

## 2018-05-01 DIAGNOSIS — K219 Gastro-esophageal reflux disease without esophagitis: Secondary | ICD-10-CM | POA: Diagnosis not present

## 2018-05-02 DIAGNOSIS — K219 Gastro-esophageal reflux disease without esophagitis: Secondary | ICD-10-CM | POA: Diagnosis not present

## 2018-05-02 DIAGNOSIS — C61 Malignant neoplasm of prostate: Secondary | ICD-10-CM | POA: Diagnosis not present

## 2018-05-02 DIAGNOSIS — D649 Anemia, unspecified: Secondary | ICD-10-CM | POA: Diagnosis not present

## 2018-05-02 DIAGNOSIS — E039 Hypothyroidism, unspecified: Secondary | ICD-10-CM | POA: Diagnosis not present

## 2018-05-02 DIAGNOSIS — E46 Unspecified protein-calorie malnutrition: Secondary | ICD-10-CM | POA: Diagnosis not present

## 2018-05-02 DIAGNOSIS — L4050 Arthropathic psoriasis, unspecified: Secondary | ICD-10-CM | POA: Diagnosis not present

## 2018-05-03 DIAGNOSIS — D649 Anemia, unspecified: Secondary | ICD-10-CM | POA: Diagnosis not present

## 2018-05-03 DIAGNOSIS — E039 Hypothyroidism, unspecified: Secondary | ICD-10-CM | POA: Diagnosis not present

## 2018-05-03 DIAGNOSIS — C61 Malignant neoplasm of prostate: Secondary | ICD-10-CM | POA: Diagnosis not present

## 2018-05-03 DIAGNOSIS — L4050 Arthropathic psoriasis, unspecified: Secondary | ICD-10-CM | POA: Diagnosis not present

## 2018-05-03 DIAGNOSIS — E46 Unspecified protein-calorie malnutrition: Secondary | ICD-10-CM | POA: Diagnosis not present

## 2018-05-03 DIAGNOSIS — K219 Gastro-esophageal reflux disease without esophagitis: Secondary | ICD-10-CM | POA: Diagnosis not present

## 2018-05-04 DIAGNOSIS — E039 Hypothyroidism, unspecified: Secondary | ICD-10-CM | POA: Diagnosis not present

## 2018-05-04 DIAGNOSIS — D649 Anemia, unspecified: Secondary | ICD-10-CM | POA: Diagnosis not present

## 2018-05-04 DIAGNOSIS — E46 Unspecified protein-calorie malnutrition: Secondary | ICD-10-CM | POA: Diagnosis not present

## 2018-05-04 DIAGNOSIS — L4050 Arthropathic psoriasis, unspecified: Secondary | ICD-10-CM | POA: Diagnosis not present

## 2018-05-04 DIAGNOSIS — C61 Malignant neoplasm of prostate: Secondary | ICD-10-CM | POA: Diagnosis not present

## 2018-05-04 DIAGNOSIS — K219 Gastro-esophageal reflux disease without esophagitis: Secondary | ICD-10-CM | POA: Diagnosis not present

## 2018-05-05 DIAGNOSIS — E46 Unspecified protein-calorie malnutrition: Secondary | ICD-10-CM | POA: Diagnosis not present

## 2018-05-05 DIAGNOSIS — D649 Anemia, unspecified: Secondary | ICD-10-CM | POA: Diagnosis not present

## 2018-05-05 DIAGNOSIS — K219 Gastro-esophageal reflux disease without esophagitis: Secondary | ICD-10-CM | POA: Diagnosis not present

## 2018-05-05 DIAGNOSIS — E039 Hypothyroidism, unspecified: Secondary | ICD-10-CM | POA: Diagnosis not present

## 2018-05-05 DIAGNOSIS — L4050 Arthropathic psoriasis, unspecified: Secondary | ICD-10-CM | POA: Diagnosis not present

## 2018-05-05 DIAGNOSIS — C61 Malignant neoplasm of prostate: Secondary | ICD-10-CM | POA: Diagnosis not present

## 2018-05-06 ENCOUNTER — Telehealth: Payer: Self-pay | Admitting: *Deleted

## 2018-05-06 NOTE — Telephone Encounter (Signed)
Received call from [facility] notifying us that pt died in his home on 05-29-2018 @ 8am

## 2018-06-05 DEATH — deceased

## 2018-08-04 DEATH — deceased

## 2019-04-11 IMAGING — NM NM BONE WHOLE BODY
2 series · 2 of 2 positions shown · non-contrast
Comparison: 12/13/2015

Radiographic correlation:  None since prior bone scan

CLINICAL DATA: Prostate cancer with osseous metastases

EXAM:
NUCLEAR MEDICINE WHOLE BODY BONE SCAN
TECHNIQUE: Whole body anterior and posterior images were obtained approximately
3 hours after intravenous injection of radiopharmaceutical.
RADIOPHARMACEUTICALS:  21 mCi Wechnetium-CCm MDP IV

[Series 1: wbr_bone_40 whole body · 2.66mm/px · 1 of 1 slices shown (1 of 2)]
[im 1/1]
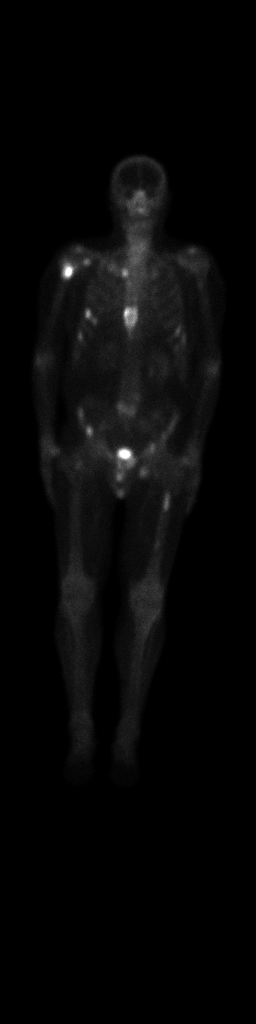

[Series 1: wbr_bone_40 whole body · 2.66mm/px · 1 of 1 slices shown (2 of 2)]
[im 1/1]
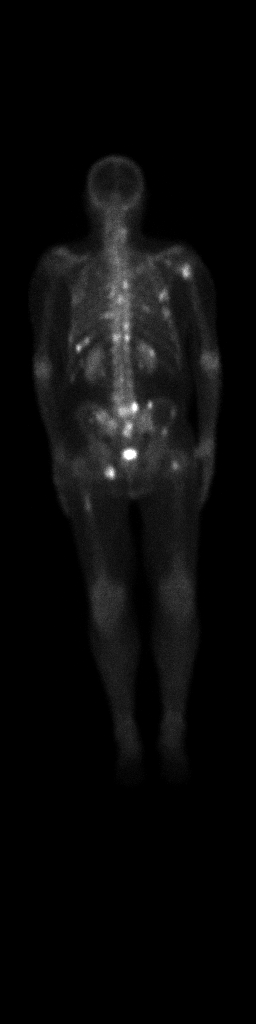

[2 of 2 positions shown; findings below may reference images not displayed]

FINDINGS: Multiple abnormal sites of increased osseous tracer accumulation
again identified compatible with osseous metastatic disease.

These include the sternum, proximal BILATERAL humeri, anterior and
posterior ribs bilaterally, thoracic and lumbar spine, pelvis, and
BILATERAL proximal femora.

Foci of uptake at the proximal LEFT humerus, mid LEFT femoral
diaphysis, and the intertrochanteric region of the RIGHT femur
appear new, as do additional lesions in an anterior lower RIGHT rib
and bilaterally in the pelvis.

Uptake at the sternum appears to have increased in size.

Expected urinary tract and soft tissue distribution of tracer.
IMPRESSION: Progressive osseous metastatic disease as above.

## 2020-02-27 IMAGING — XA IR FLUORO GUIDE CV LINE*L*
1 series · 1 of 1 positions shown · non-contrast
Comparison: None.

INDICATION: 76-year-old with metastatic prostate cancer. Port-A-Cath needed for
systemic chemotherapy.

EXAM:
FLUOROSCOPIC AND ULTRASOUND GUIDED PLACEMENT OF A SUBCUTANEOUS PORT

[Series 300: line placements · 1 of 1 slices shown]
[im 1/1]
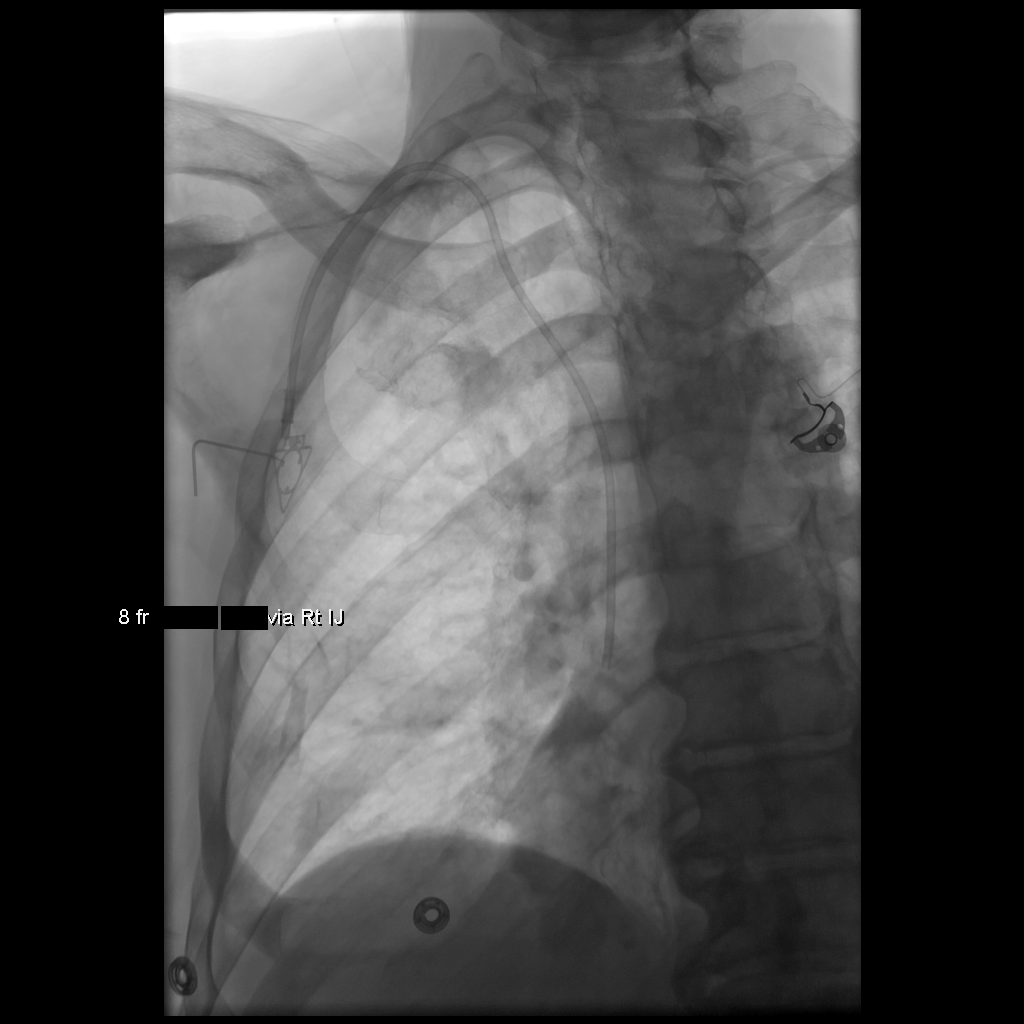

[1 of 1 positions shown; findings below may reference images not displayed]

MEDICATIONS:
Ancef 2 g; The antibiotic was administered within an appropriate
time interval prior to skin puncture.

ANESTHESIA/SEDATION:
Versed 1.5 mg IV; Fentanyl 75 mcg IV;

Moderate Sedation Time:  24 minutes

The patient was continuously monitored during the procedure by the
interventional radiology nurse under my direct supervision.

FLUOROSCOPY TIME:  36 seconds, 7 mGy

COMPLICATIONS:
None immediate.

PROCEDURE:
The procedure, risks, benefits, and alternatives were explained to
the patient. Questions regarding the procedure were encouraged and
answered. The patient understands and consents to the procedure.

Patient was placed supine on the interventional table. Ultrasound
confirmed a patent right internal jugular vein. The right chest and
neck were cleaned with a skin antiseptic and a sterile drape was
placed. Maximal barrier sterile technique was utilized including
caps, mask, sterile gowns, sterile gloves, sterile drape, hand
hygiene and skin antiseptic. The right neck was anesthetized with 1%
lidocaine. Small incision was made in the right neck with a blade.
Micropuncture set was placed in the right internal jugular vein with
ultrasound guidance. The micropuncture wire was used for measurement
purposes. The right chest was anesthetized with 1% lidocaine with
epinephrine. #15 blade was used to make an incision and a
subcutaneous port pocket was formed. 8 french Power Port was
assembled. Subcutaneous tunnel was formed with a stiff tunneling
device. The port catheter was brought through the subcutaneous
tunnel. The port was placed in the subcutaneous pocket. The
micropuncture set was exchanged for a peel-away sheath. The catheter
was placed through the peel-away sheath and the tip was positioned
at the superior cavoatrial junction. Catheter placement was
confirmed with fluoroscopy. The port was accessed and flushed with
heparinized saline. The port pocket was closed using two layers of
absorbable sutures and Dermabond. The vein skin site was closed
using a single layer of absorbable suture and Dermabond. Sterile
dressings were applied. Patient tolerated the procedure well without
an immediate complication. Ultrasound and fluoroscopic images were
taken and saved for this procedure.
IMPRESSION: Placement of a subcutaneous power injectable port device. Catheter
tip at the superior cavoatrial junction and ready to use.
